# Patient Record
Sex: Female | Born: 1975 | Race: Black or African American | Hispanic: No | Marital: Single | Smoking: Never smoker
Health system: Southern US, Community
[De-identification: ages and names within clinical notes are randomized; demographics above are authoritative.]

## PROBLEM LIST (undated history)

## (undated) ENCOUNTER — Ambulatory Visit (HOSPITAL_COMMUNITY): Admission: EM | Discharge status: Absent without leave | Payer: Medicaid Other

## (undated) DIAGNOSIS — M329 Systemic lupus erythematosus, unspecified: Secondary | ICD-10-CM

## (undated) DIAGNOSIS — R7611 Nonspecific reaction to tuberculin skin test without active tuberculosis: Secondary | ICD-10-CM

## (undated) DIAGNOSIS — J189 Pneumonia, unspecified organism: Secondary | ICD-10-CM

## (undated) DIAGNOSIS — R519 Headache, unspecified: Secondary | ICD-10-CM

## (undated) DIAGNOSIS — K219 Gastro-esophageal reflux disease without esophagitis: Secondary | ICD-10-CM

## (undated) DIAGNOSIS — M1990 Unspecified osteoarthritis, unspecified site: Secondary | ICD-10-CM

## (undated) DIAGNOSIS — D649 Anemia, unspecified: Secondary | ICD-10-CM

## (undated) DIAGNOSIS — R51 Headache: Secondary | ICD-10-CM

## (undated) DIAGNOSIS — Z9289 Personal history of other medical treatment: Secondary | ICD-10-CM

## (undated) HISTORY — PX: RENAL BIOPSY, PERCUTANEOUS: SUR144

---

## 2001-06-04 ENCOUNTER — Emergency Department (HOSPITAL_COMMUNITY): Admission: EM | Admit: 2001-06-04 | Discharge: 2001-06-05 | Payer: Self-pay | Admitting: *Deleted

## 2002-01-05 ENCOUNTER — Encounter: Admission: RE | Admit: 2002-01-05 | Discharge: 2002-01-05 | Payer: Self-pay

## 2002-03-01 ENCOUNTER — Other Ambulatory Visit: Admission: RE | Admit: 2002-03-01 | Discharge: 2002-03-01 | Payer: Self-pay | Admitting: Obstetrics and Gynecology

## 2002-09-13 ENCOUNTER — Other Ambulatory Visit: Admission: RE | Admit: 2002-09-13 | Discharge: 2002-09-13 | Payer: Self-pay | Admitting: Obstetrics and Gynecology

## 2003-06-04 ENCOUNTER — Other Ambulatory Visit: Admission: RE | Admit: 2003-06-04 | Discharge: 2003-06-04 | Payer: Self-pay | Admitting: Obstetrics and Gynecology

## 2003-10-17 ENCOUNTER — Other Ambulatory Visit: Admission: RE | Admit: 2003-10-17 | Discharge: 2003-10-17 | Payer: Self-pay | Admitting: Obstetrics and Gynecology

## 2003-10-21 ENCOUNTER — Emergency Department (HOSPITAL_COMMUNITY): Admission: EM | Admit: 2003-10-21 | Discharge: 2003-10-21 | Payer: Self-pay | Admitting: Emergency Medicine

## 2004-08-22 ENCOUNTER — Ambulatory Visit: Payer: Self-pay | Admitting: Internal Medicine

## 2004-08-28 ENCOUNTER — Other Ambulatory Visit: Admission: RE | Admit: 2004-08-28 | Discharge: 2004-08-28 | Payer: Self-pay | Admitting: Obstetrics and Gynecology

## 2005-01-30 ENCOUNTER — Ambulatory Visit: Payer: Self-pay | Admitting: Family Medicine

## 2005-03-03 ENCOUNTER — Other Ambulatory Visit: Admission: RE | Admit: 2005-03-03 | Discharge: 2005-03-03 | Payer: Self-pay | Admitting: Obstetrics and Gynecology

## 2005-03-26 ENCOUNTER — Encounter: Payer: Self-pay | Admitting: Emergency Medicine

## 2005-03-27 ENCOUNTER — Inpatient Hospital Stay (HOSPITAL_COMMUNITY): Admission: EM | Admit: 2005-03-27 | Discharge: 2005-03-31 | Payer: Self-pay | Admitting: Internal Medicine

## 2005-03-30 ENCOUNTER — Encounter (INDEPENDENT_AMBULATORY_CARE_PROVIDER_SITE_OTHER): Payer: Self-pay | Admitting: Interventional Cardiology

## 2005-05-16 ENCOUNTER — Emergency Department (HOSPITAL_COMMUNITY): Admission: EM | Admit: 2005-05-16 | Discharge: 2005-05-16 | Payer: Self-pay | Admitting: Emergency Medicine

## 2005-06-06 ENCOUNTER — Emergency Department (HOSPITAL_COMMUNITY): Admission: EM | Admit: 2005-06-06 | Discharge: 2005-06-06 | Payer: Self-pay | Admitting: Family Medicine

## 2005-08-13 ENCOUNTER — Ambulatory Visit: Payer: Self-pay | Admitting: Internal Medicine

## 2005-08-25 ENCOUNTER — Ambulatory Visit: Payer: Self-pay | Admitting: Internal Medicine

## 2005-09-15 ENCOUNTER — Ambulatory Visit: Payer: Self-pay | Admitting: Internal Medicine

## 2005-09-16 ENCOUNTER — Ambulatory Visit (HOSPITAL_COMMUNITY): Admission: RE | Admit: 2005-09-16 | Discharge: 2005-09-16 | Payer: Self-pay | Admitting: Internal Medicine

## 2005-09-28 ENCOUNTER — Ambulatory Visit (HOSPITAL_COMMUNITY): Admission: RE | Admit: 2005-09-28 | Discharge: 2005-09-28 | Payer: Self-pay | Admitting: Internal Medicine

## 2005-11-02 ENCOUNTER — Ambulatory Visit (HOSPITAL_COMMUNITY): Admission: RE | Admit: 2005-11-02 | Discharge: 2005-11-02 | Payer: Self-pay | Admitting: Internal Medicine

## 2005-11-16 ENCOUNTER — Ambulatory Visit: Payer: Self-pay | Admitting: Internal Medicine

## 2005-11-24 ENCOUNTER — Encounter (INDEPENDENT_AMBULATORY_CARE_PROVIDER_SITE_OTHER): Payer: Self-pay | Admitting: Cardiology

## 2005-11-24 ENCOUNTER — Ambulatory Visit (HOSPITAL_COMMUNITY): Admission: RE | Admit: 2005-11-24 | Discharge: 2005-11-24 | Payer: Self-pay | Admitting: Internal Medicine

## 2006-01-04 ENCOUNTER — Ambulatory Visit: Payer: Self-pay | Admitting: Internal Medicine

## 2006-02-22 ENCOUNTER — Ambulatory Visit: Payer: Self-pay | Admitting: Internal Medicine

## 2006-03-02 ENCOUNTER — Ambulatory Visit: Payer: Self-pay | Admitting: Internal Medicine

## 2006-03-29 ENCOUNTER — Ambulatory Visit: Payer: Self-pay | Admitting: Internal Medicine

## 2006-05-26 ENCOUNTER — Ambulatory Visit: Payer: Self-pay | Admitting: Internal Medicine

## 2006-07-12 ENCOUNTER — Encounter (INDEPENDENT_AMBULATORY_CARE_PROVIDER_SITE_OTHER): Payer: Self-pay | Admitting: Internal Medicine

## 2006-08-11 ENCOUNTER — Ambulatory Visit: Payer: Self-pay | Admitting: Internal Medicine

## 2006-09-08 ENCOUNTER — Encounter (INDEPENDENT_AMBULATORY_CARE_PROVIDER_SITE_OTHER): Payer: Self-pay | Admitting: Internal Medicine

## 2006-10-20 ENCOUNTER — Encounter (INDEPENDENT_AMBULATORY_CARE_PROVIDER_SITE_OTHER): Payer: Self-pay | Admitting: Internal Medicine

## 2006-11-18 DIAGNOSIS — R945 Abnormal results of liver function studies: Secondary | ICD-10-CM

## 2006-11-18 DIAGNOSIS — B379 Candidiasis, unspecified: Secondary | ICD-10-CM | POA: Insufficient documentation

## 2006-11-18 DIAGNOSIS — J029 Acute pharyngitis, unspecified: Secondary | ICD-10-CM

## 2006-11-25 ENCOUNTER — Ambulatory Visit: Payer: Self-pay | Admitting: Internal Medicine

## 2006-11-25 DIAGNOSIS — M329 Systemic lupus erythematosus, unspecified: Secondary | ICD-10-CM | POA: Insufficient documentation

## 2006-12-05 LAB — CONVERTED CEMR LAB
Basophils Absolute: 0 10*3/uL
Basophils Relative: 0 %
Eosinophils Absolute: 0.1 10*3/uL
Eosinophils Relative: 1 %
HCT: 38.4 %
Hemoglobin: 11.6 g/dL — ABNORMAL LOW
Lymphocytes Relative: 17 %
Lymphs Abs: 1.1 10*3/uL
MCHC: 30.2 g/dL
MCV: 91 fL
Monocytes Absolute: 0.3 10*3/uL
Monocytes Relative: 5 %
Neutro Abs: 4.9 10*3/uL
Neutrophils Relative %: 76 %
Platelets: 204 10*3/uL
RBC: 4.22 M/uL
RDW: 13.8 %
WBC: 6.4 10*3/uL

## 2007-02-03 HISTORY — PX: DILATION AND CURETTAGE OF UTERUS: SHX78

## 2007-02-09 ENCOUNTER — Encounter (INDEPENDENT_AMBULATORY_CARE_PROVIDER_SITE_OTHER): Payer: Self-pay | Admitting: Internal Medicine

## 2007-02-09 ENCOUNTER — Telehealth (INDEPENDENT_AMBULATORY_CARE_PROVIDER_SITE_OTHER): Payer: Self-pay | Admitting: Internal Medicine

## 2007-02-10 ENCOUNTER — Ambulatory Visit: Payer: Self-pay | Admitting: Internal Medicine

## 2007-02-14 ENCOUNTER — Ambulatory Visit: Payer: Self-pay | Admitting: Internal Medicine

## 2007-02-14 LAB — CONVERTED CEMR LAB
ALT: 31 units/L (ref 0–35)
AST: 40 units/L — ABNORMAL HIGH (ref 0–37)
Albumin: 3.9 g/dL (ref 3.5–5.2)
Total Protein: 8.5 g/dL — ABNORMAL HIGH (ref 6.0–8.3)

## 2007-02-15 ENCOUNTER — Telehealth (INDEPENDENT_AMBULATORY_CARE_PROVIDER_SITE_OTHER): Payer: Self-pay | Admitting: Internal Medicine

## 2007-02-18 LAB — CONVERTED CEMR LAB
Albumin ELP: 43.7 % — ABNORMAL LOW (ref 55.8–66.1)
Beta Globulin: 5.6 % (ref 4.7–7.2)
Gamma Globulin: 31.7 % — ABNORMAL HIGH (ref 11.1–18.8)
IgG (Immunoglobin G), Serum: 3300 mg/dL — ABNORMAL HIGH (ref 694–1618)

## 2007-02-19 ENCOUNTER — Inpatient Hospital Stay (HOSPITAL_COMMUNITY): Admission: AD | Admit: 2007-02-19 | Discharge: 2007-02-19 | Payer: Self-pay | Admitting: Obstetrics and Gynecology

## 2007-02-21 ENCOUNTER — Telehealth (INDEPENDENT_AMBULATORY_CARE_PROVIDER_SITE_OTHER): Payer: Self-pay | Admitting: Internal Medicine

## 2007-02-23 ENCOUNTER — Inpatient Hospital Stay (HOSPITAL_COMMUNITY): Admission: AD | Admit: 2007-02-23 | Discharge: 2007-02-23 | Payer: Self-pay | Admitting: Obstetrics and Gynecology

## 2007-02-23 ENCOUNTER — Ambulatory Visit: Payer: Self-pay | Admitting: Internal Medicine

## 2007-02-24 ENCOUNTER — Encounter (INDEPENDENT_AMBULATORY_CARE_PROVIDER_SITE_OTHER): Payer: Self-pay | Admitting: Internal Medicine

## 2007-02-27 ENCOUNTER — Inpatient Hospital Stay (HOSPITAL_COMMUNITY): Admission: AD | Admit: 2007-02-27 | Discharge: 2007-02-27 | Payer: Self-pay | Admitting: Obstetrics and Gynecology

## 2007-03-02 ENCOUNTER — Inpatient Hospital Stay (HOSPITAL_COMMUNITY): Admission: AD | Admit: 2007-03-02 | Discharge: 2007-03-03 | Payer: Self-pay | Admitting: Obstetrics and Gynecology

## 2007-03-07 ENCOUNTER — Encounter (INDEPENDENT_AMBULATORY_CARE_PROVIDER_SITE_OTHER): Payer: Self-pay | Admitting: Obstetrics and Gynecology

## 2007-03-07 ENCOUNTER — Encounter (INDEPENDENT_AMBULATORY_CARE_PROVIDER_SITE_OTHER): Payer: Self-pay | Admitting: Internal Medicine

## 2007-03-07 ENCOUNTER — Ambulatory Visit (HOSPITAL_COMMUNITY): Admission: AD | Admit: 2007-03-07 | Discharge: 2007-03-07 | Payer: Self-pay | Admitting: Obstetrics and Gynecology

## 2007-03-08 ENCOUNTER — Telehealth (INDEPENDENT_AMBULATORY_CARE_PROVIDER_SITE_OTHER): Payer: Self-pay | Admitting: Internal Medicine

## 2007-03-08 ENCOUNTER — Emergency Department (HOSPITAL_COMMUNITY): Admission: EM | Admit: 2007-03-08 | Discharge: 2007-03-09 | Payer: Self-pay | Admitting: Emergency Medicine

## 2007-09-24 ENCOUNTER — Encounter (INDEPENDENT_AMBULATORY_CARE_PROVIDER_SITE_OTHER): Payer: Self-pay | Admitting: Internal Medicine

## 2007-09-24 DIAGNOSIS — K219 Gastro-esophageal reflux disease without esophagitis: Secondary | ICD-10-CM | POA: Insufficient documentation

## 2007-11-03 ENCOUNTER — Telehealth (INDEPENDENT_AMBULATORY_CARE_PROVIDER_SITE_OTHER): Payer: Self-pay | Admitting: Internal Medicine

## 2007-11-03 ENCOUNTER — Emergency Department (HOSPITAL_COMMUNITY): Admission: EM | Admit: 2007-11-03 | Discharge: 2007-11-04 | Payer: Self-pay | Admitting: Family Medicine

## 2007-11-08 ENCOUNTER — Telehealth (INDEPENDENT_AMBULATORY_CARE_PROVIDER_SITE_OTHER): Payer: Self-pay | Admitting: Internal Medicine

## 2007-11-08 DIAGNOSIS — N83209 Unspecified ovarian cyst, unspecified side: Secondary | ICD-10-CM

## 2007-12-30 ENCOUNTER — Emergency Department (HOSPITAL_COMMUNITY): Admission: EM | Admit: 2007-12-30 | Discharge: 2007-12-31 | Payer: Self-pay | Admitting: Emergency Medicine

## 2008-01-02 ENCOUNTER — Emergency Department (HOSPITAL_COMMUNITY): Admission: EM | Admit: 2008-01-02 | Discharge: 2008-01-02 | Payer: Self-pay | Admitting: Family Medicine

## 2008-02-01 ENCOUNTER — Encounter (INDEPENDENT_AMBULATORY_CARE_PROVIDER_SITE_OTHER): Payer: Self-pay | Admitting: Internal Medicine

## 2008-02-21 ENCOUNTER — Encounter (INDEPENDENT_AMBULATORY_CARE_PROVIDER_SITE_OTHER): Payer: Self-pay | Admitting: Internal Medicine

## 2008-03-01 ENCOUNTER — Ambulatory Visit: Payer: Self-pay | Admitting: Internal Medicine

## 2008-03-01 DIAGNOSIS — R350 Frequency of micturition: Secondary | ICD-10-CM

## 2008-03-01 LAB — CONVERTED CEMR LAB
ALT: 18 units/L (ref 0–35)
Basophils Absolute: 0 10*3/uL (ref 0.0–0.1)
Bilirubin Urine: NEGATIVE
CO2: 22 meq/L (ref 19–32)
Calcium: 8.7 mg/dL (ref 8.4–10.5)
Chloride: 104 meq/L (ref 96–112)
Hemoglobin: 10.7 g/dL — ABNORMAL LOW (ref 12.0–15.0)
Lymphocytes Relative: 25 % (ref 12–46)
Monocytes Absolute: 0.5 10*3/uL (ref 0.1–1.0)
Neutro Abs: 4.7 10*3/uL (ref 1.7–7.7)
Platelets: 218 10*3/uL (ref 150–400)
RDW: 14.4 % (ref 11.5–15.5)
Sodium: 139 meq/L (ref 135–145)
Specific Gravity, Urine: >=1.03
Total Protein: 8.3 g/dL (ref 6.0–8.3)
Urobilinogen, UA: 0.2
WBC: 7.1 10*3/uL (ref 4.0–10.5)
pH: 6.5

## 2008-03-02 ENCOUNTER — Encounter (INDEPENDENT_AMBULATORY_CARE_PROVIDER_SITE_OTHER): Payer: Self-pay | Admitting: Internal Medicine

## 2008-03-08 ENCOUNTER — Telehealth (INDEPENDENT_AMBULATORY_CARE_PROVIDER_SITE_OTHER): Payer: Self-pay | Admitting: Internal Medicine

## 2008-03-30 ENCOUNTER — Ambulatory Visit: Payer: Self-pay | Admitting: Internal Medicine

## 2008-04-10 ENCOUNTER — Emergency Department (HOSPITAL_COMMUNITY): Admission: EM | Admit: 2008-04-10 | Discharge: 2008-04-10 | Payer: Self-pay | Admitting: Emergency Medicine

## 2008-04-10 ENCOUNTER — Encounter (INDEPENDENT_AMBULATORY_CARE_PROVIDER_SITE_OTHER): Payer: Self-pay | Admitting: Internal Medicine

## 2008-04-22 LAB — CONVERTED CEMR LAB
Alkaline Phosphatase: 64 units/L (ref 39–117)
Basophils Absolute: 0 10*3/uL (ref 0.0–0.1)
Eosinophils Absolute: 0.1 10*3/uL (ref 0.0–0.7)
Eosinophils Relative: 2 % (ref 0–5)
Glucose, Bld: 112 mg/dL — ABNORMAL HIGH (ref 70–99)
HCT: 35 % — ABNORMAL LOW (ref 36.0–46.0)
MCHC: 32.3 g/dL (ref 30.0–36.0)
MCV: 87.9 fL (ref 78.0–100.0)
Platelets: 225 10*3/uL (ref 150–400)
RDW: 14.6 % (ref 11.5–15.5)
Sodium: 140 meq/L (ref 135–145)
Total Bilirubin: 0.3 mg/dL (ref 0.3–1.2)
Total Protein: 8.4 g/dL — ABNORMAL HIGH (ref 6.0–8.3)

## 2008-04-24 ENCOUNTER — Telehealth (INDEPENDENT_AMBULATORY_CARE_PROVIDER_SITE_OTHER): Payer: Self-pay | Admitting: Internal Medicine

## 2008-04-27 ENCOUNTER — Ambulatory Visit: Payer: Self-pay | Admitting: Internal Medicine

## 2008-04-27 ENCOUNTER — Encounter (INDEPENDENT_AMBULATORY_CARE_PROVIDER_SITE_OTHER): Payer: Self-pay | Admitting: Nurse Practitioner

## 2008-04-27 DIAGNOSIS — L0390 Cellulitis, unspecified: Secondary | ICD-10-CM

## 2008-04-27 DIAGNOSIS — L0291 Cutaneous abscess, unspecified: Secondary | ICD-10-CM | POA: Insufficient documentation

## 2008-04-30 LAB — CONVERTED CEMR LAB
BUN: 13 mg/dL (ref 6–23)
Basophils Relative: 1 % (ref 0–1)
CO2: 26 meq/L (ref 19–32)
Creatinine, Ser: 0.75 mg/dL (ref 0.40–1.20)
Eosinophils Absolute: 0.1 10*3/uL (ref 0.0–0.7)
Eosinophils Relative: 2 % (ref 0–5)
Glucose, Bld: 85 mg/dL (ref 70–99)
MCHC: 32.7 g/dL (ref 30.0–36.0)
MCV: 87.1 fL (ref 78.0–100.0)
Monocytes Absolute: 0.4 10*3/uL (ref 0.1–1.0)
Monocytes Relative: 6 % (ref 3–12)
Neutrophils Relative %: 69 % (ref 43–77)
RBC: 3.72 M/uL — ABNORMAL LOW (ref 3.87–5.11)
Total Bilirubin: 0.2 mg/dL — ABNORMAL LOW (ref 0.3–1.2)

## 2008-07-09 ENCOUNTER — Encounter (INDEPENDENT_AMBULATORY_CARE_PROVIDER_SITE_OTHER): Payer: Self-pay | Admitting: Internal Medicine

## 2008-07-31 ENCOUNTER — Ambulatory Visit: Payer: Self-pay | Admitting: Internal Medicine

## 2008-07-31 DIAGNOSIS — R51 Headache: Secondary | ICD-10-CM | POA: Insufficient documentation

## 2008-07-31 DIAGNOSIS — G4700 Insomnia, unspecified: Secondary | ICD-10-CM | POA: Insufficient documentation

## 2008-07-31 DIAGNOSIS — R519 Headache, unspecified: Secondary | ICD-10-CM | POA: Insufficient documentation

## 2008-07-31 LAB — CONVERTED CEMR LAB
Bilirubin Urine: NEGATIVE
Glucose, Urine, Semiquant: NEGATIVE
Ketones, urine, test strip: NEGATIVE
Protein, U semiquant: >=300
Whiff Test: POSITIVE

## 2008-08-01 ENCOUNTER — Encounter (INDEPENDENT_AMBULATORY_CARE_PROVIDER_SITE_OTHER): Payer: Self-pay | Admitting: Internal Medicine

## 2008-08-10 ENCOUNTER — Encounter (INDEPENDENT_AMBULATORY_CARE_PROVIDER_SITE_OTHER): Payer: Self-pay | Admitting: Internal Medicine

## 2008-08-16 ENCOUNTER — Encounter (INDEPENDENT_AMBULATORY_CARE_PROVIDER_SITE_OTHER): Payer: Self-pay | Admitting: Internal Medicine

## 2008-08-16 ENCOUNTER — Encounter: Admission: RE | Admit: 2008-08-16 | Discharge: 2008-09-14 | Payer: Self-pay | Admitting: Internal Medicine

## 2008-08-19 LAB — CONVERTED CEMR LAB
AST: 47 units/L — ABNORMAL HIGH (ref 0–37)
Albumin: 3.4 g/dL — ABNORMAL LOW (ref 3.5–5.2)
Alkaline Phosphatase: 51 units/L (ref 39–117)
BUN: 14 mg/dL (ref 6–23)
Calcium: 8.7 mg/dL (ref 8.4–10.5)
Chloride: 104 meq/L (ref 96–112)
Eosinophils Relative: 1 % (ref 0–5)
Glucose, Bld: 82 mg/dL (ref 70–99)
HCT: 35.5 % — ABNORMAL LOW (ref 36.0–46.0)
Hemoglobin: 11.3 g/dL — ABNORMAL LOW (ref 12.0–15.0)
Lymphocytes Relative: 21 % (ref 12–46)
Lymphs Abs: 0.9 10*3/uL (ref 0.7–4.0)
Monocytes Absolute: 0.3 10*3/uL (ref 0.1–1.0)
Neutro Abs: 3.2 10*3/uL (ref 1.7–7.7)
Potassium: 4.5 meq/L (ref 3.5–5.3)
Sodium: 136 meq/L (ref 135–145)
Total Protein: 8.3 g/dL (ref 6.0–8.3)
WBC: 4.5 10*3/uL (ref 4.0–10.5)

## 2008-08-21 ENCOUNTER — Ambulatory Visit: Payer: Self-pay | Admitting: Internal Medicine

## 2008-08-21 LAB — CONVERTED CEMR LAB
Ketones, urine, test strip: NEGATIVE
Nitrite: NEGATIVE
Specific Gravity, Urine: >=1.03
Urobilinogen, UA: 0.2
WBC Urine, dipstick: NEGATIVE
pH: 5.5

## 2008-09-23 LAB — CONVERTED CEMR LAB
HCV Ab: NEGATIVE
Hep A Total Ab: NEGATIVE
Hep B Core Total Ab: NEGATIVE
Hep B S Ab: NEGATIVE
RBC Folate: 516 ng/mL (ref 180–600)
TIBC: 248 ug/dL — ABNORMAL LOW (ref 250–470)

## 2008-10-12 ENCOUNTER — Encounter (INDEPENDENT_AMBULATORY_CARE_PROVIDER_SITE_OTHER): Payer: Self-pay | Admitting: Internal Medicine

## 2008-11-01 ENCOUNTER — Ambulatory Visit: Payer: Self-pay | Admitting: Internal Medicine

## 2008-11-01 DIAGNOSIS — R3129 Other microscopic hematuria: Secondary | ICD-10-CM

## 2008-11-01 DIAGNOSIS — D509 Iron deficiency anemia, unspecified: Secondary | ICD-10-CM

## 2008-11-01 LAB — CONVERTED CEMR LAB
Ketones, urine, test strip: NEGATIVE
Nitrite: NEGATIVE
Specific Gravity, Urine: 1.025
Urobilinogen, UA: 0.2
pH: 5.5

## 2008-11-02 ENCOUNTER — Encounter (INDEPENDENT_AMBULATORY_CARE_PROVIDER_SITE_OTHER): Payer: Self-pay | Admitting: Internal Medicine

## 2008-11-09 DIAGNOSIS — N390 Urinary tract infection, site not specified: Secondary | ICD-10-CM

## 2008-11-10 IMAGING — US US OB TRANSVAGINAL
1 series · 14 of 25 positions shown · non-contrast
Comparison: 02/19/07.

CLINICAL DATA: 5 weeks pregnant with bleeding.  
 TRANSVAGINAL OBSTETRICAL US:
TECHNIQUE: Transvaginal ultrasound was performed for evaluation of the gestation as well as the maternal uterus and adnexal regions.

[Series 1: us ob transvaginal · 0.14mm/px · 14 of 25 slices shown]
[im 1/25]
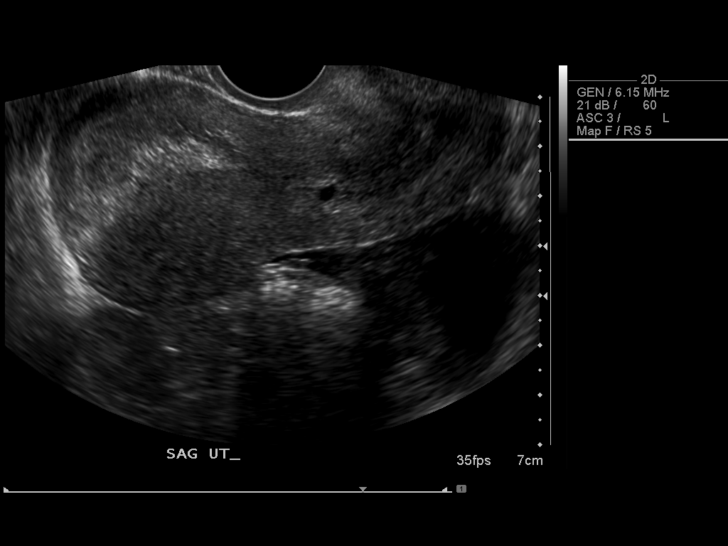
[im 3/25]
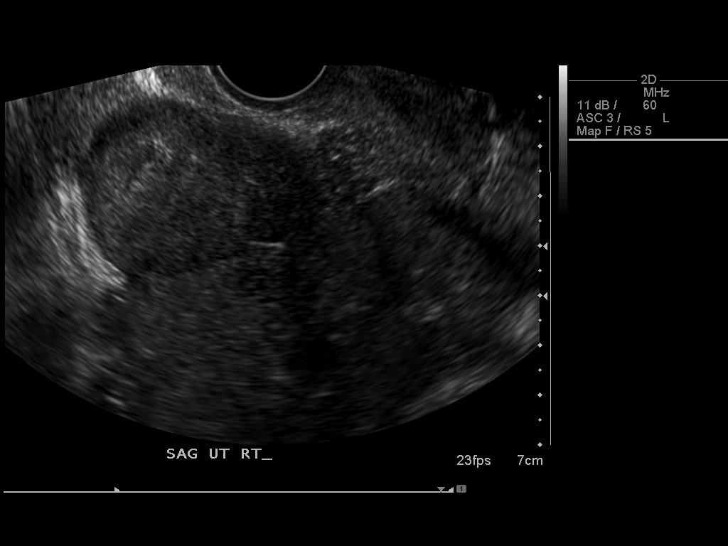
[im 5/25]
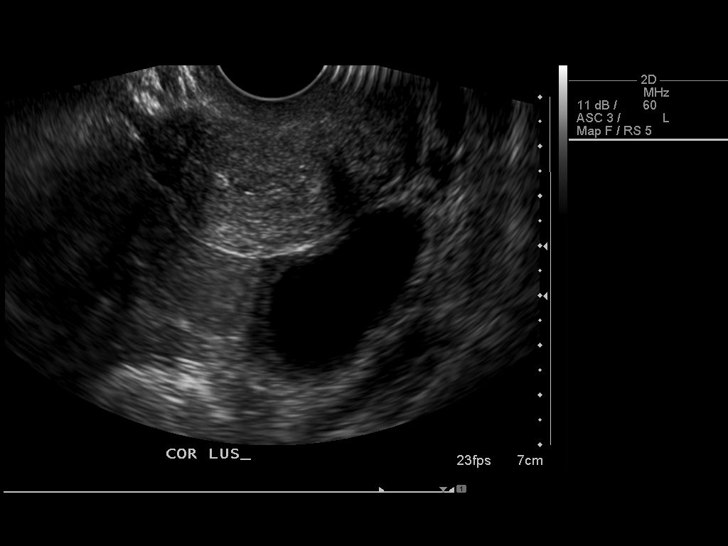
[im 7/25]
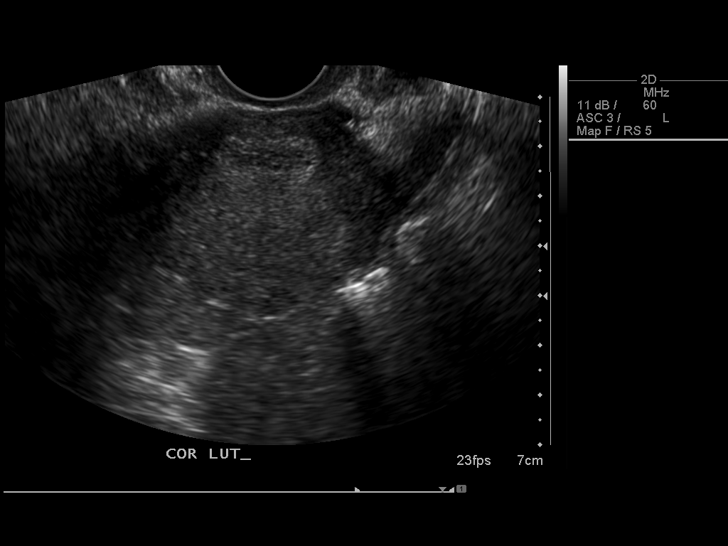
[im 9/25]
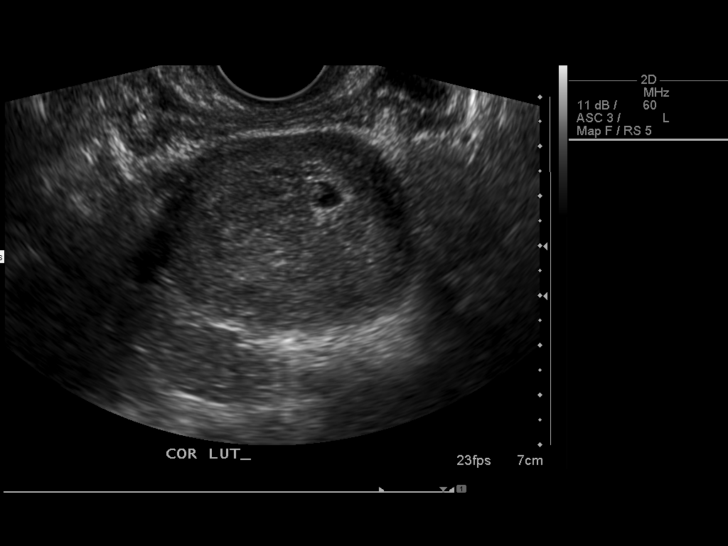
[im 10/25]
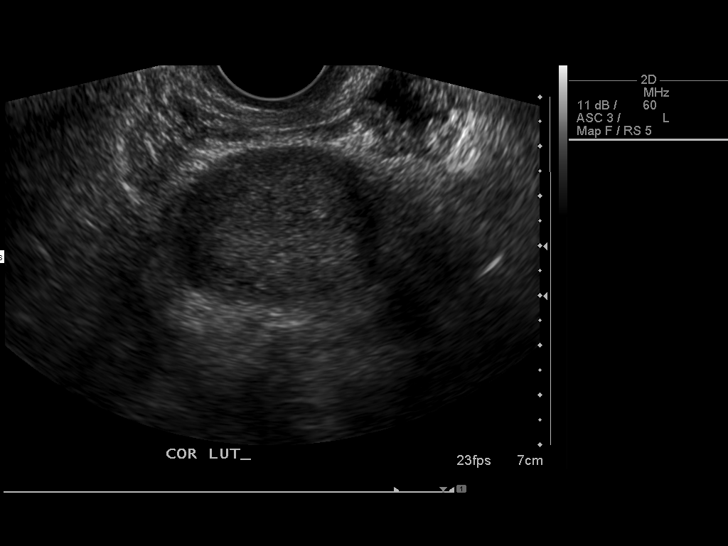
[im 12/25]
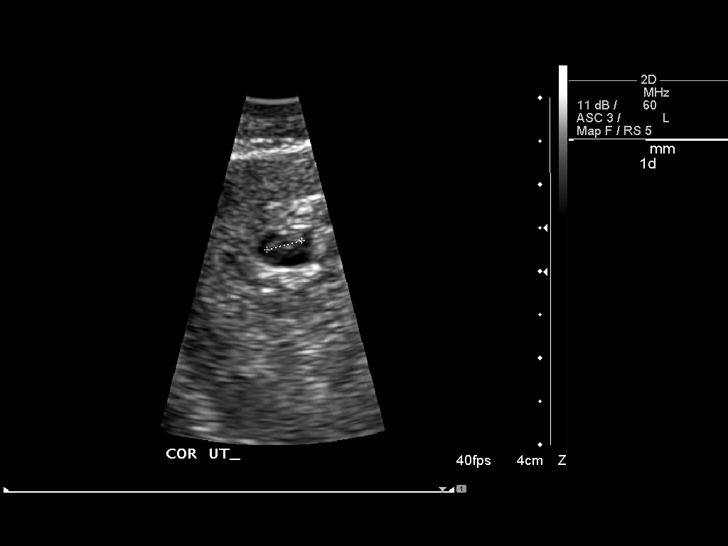
[im 14/25]
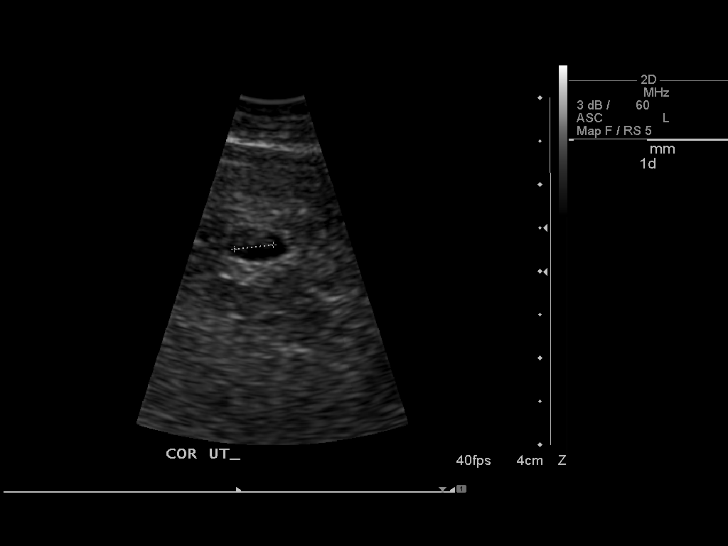
[im 16/25]
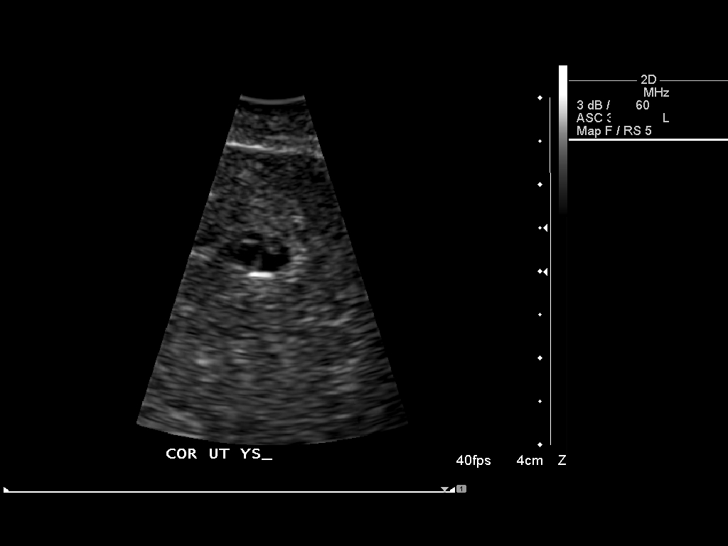
[im 17/25]
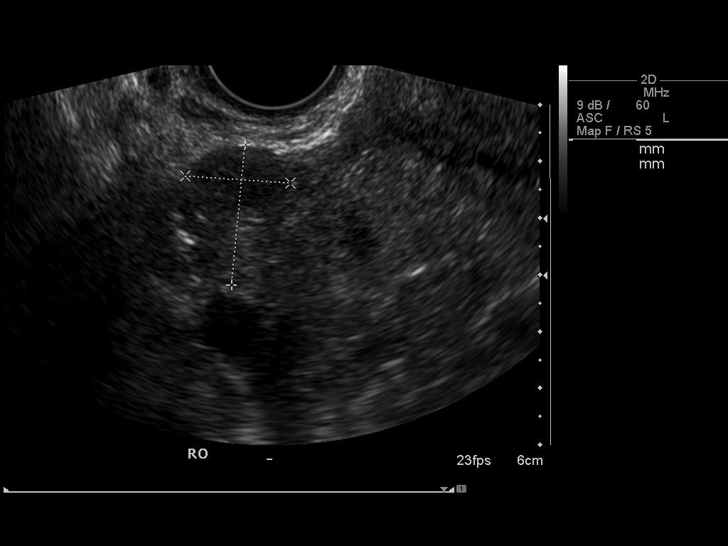
[im 19/25]
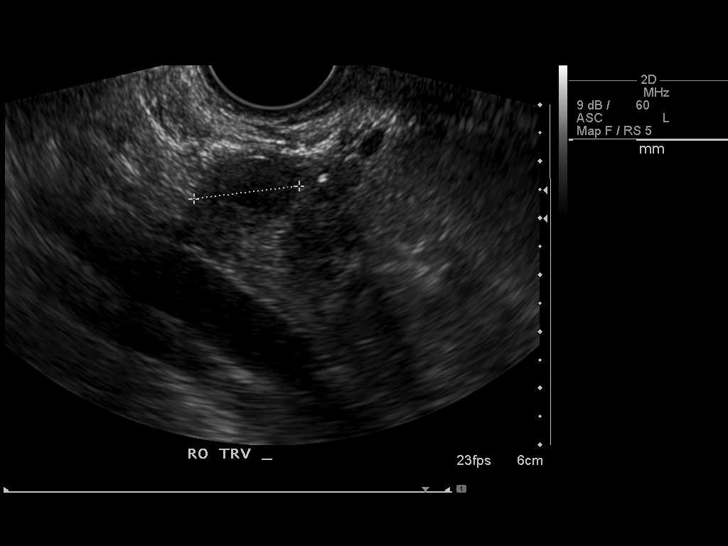
[im 21/25]
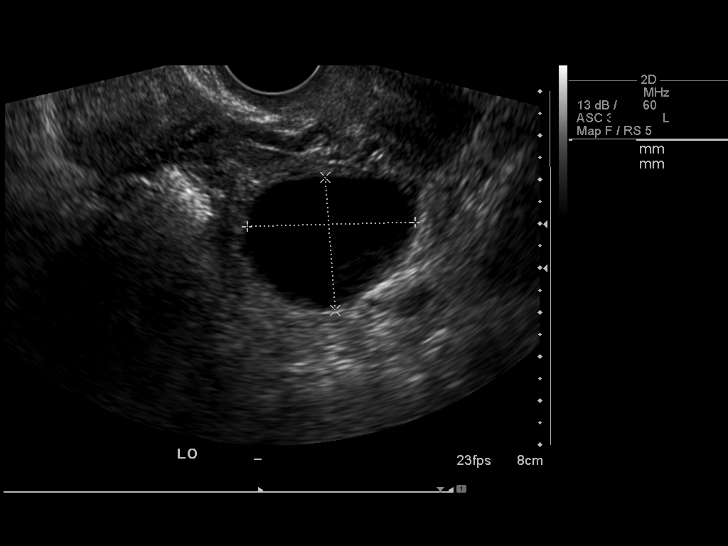
[im 23/25]
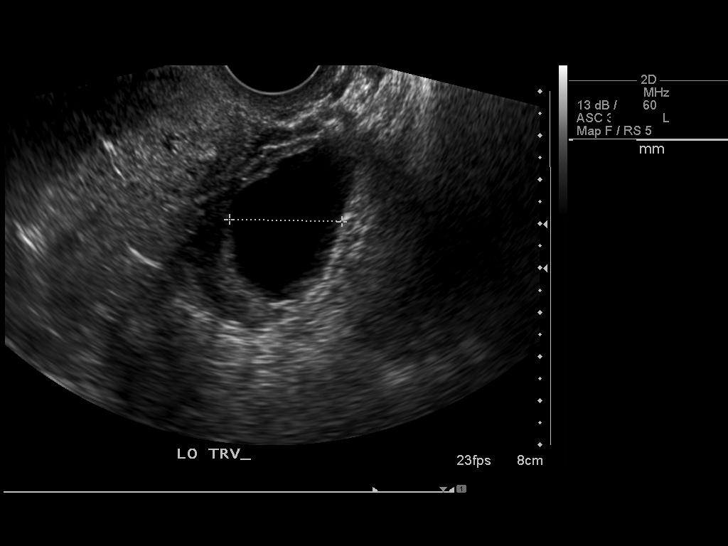
[im 25/25]
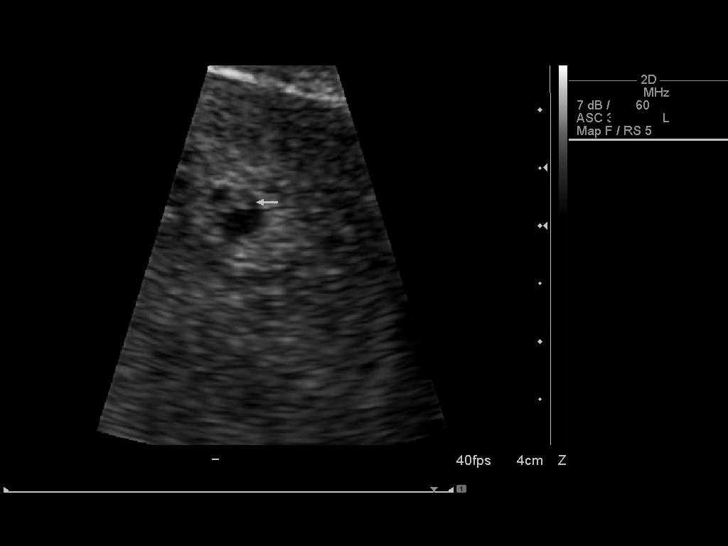

[14 of 25 positions shown; findings below may reference images not displayed]

FINDINGS: Small intrauterine gestational sac is identified with a small embryo measuring 4.2 mm, which corresponds with a 6 week 1 day gestation.  Heart rate is 123 bpm.  Small yolk sac is seen.  
 Small subchorionic hemorrhage is noted.  The right ovary is normal.  The left ovary has a 3.8 x 3.0 x 2.5 cm cyst associated with it.  No free pelvic fluid collections are seen.
IMPRESSION: 1.  Single living intrauterine embryo estimated at 6 weeks 1 day gestation based on crown-rump length of 4.2 mm.  
 2.  Small yolk sac is seen.  
 3.  3.8 x 3.0 x 2.5 cm cyst associated with the left ovary.  
 4.  No free pelvic fluid collections are seen.  
 5.  Small subchorionic hemorrhage.

## 2008-11-13 ENCOUNTER — Ambulatory Visit: Payer: Self-pay | Admitting: Physician Assistant

## 2008-11-13 DIAGNOSIS — M25569 Pain in unspecified knee: Secondary | ICD-10-CM

## 2008-11-13 DIAGNOSIS — R21 Rash and other nonspecific skin eruption: Secondary | ICD-10-CM | POA: Insufficient documentation

## 2008-11-14 IMAGING — US US OB TRANSVAGINAL
1 series · 13 of 28 positions shown · non-contrast
Comparison: none

CLINICAL DATA: 31-year-old with LMP [DATE], early OB ultrasound 4 days ago
with estimated gestational age 6 weeks 5 days, presenting with continued vaginal
bleeding.

[Series 1: us ob transvaginal · 0.13mm/px · 13 of 37 slices shown]
[im 2/37]
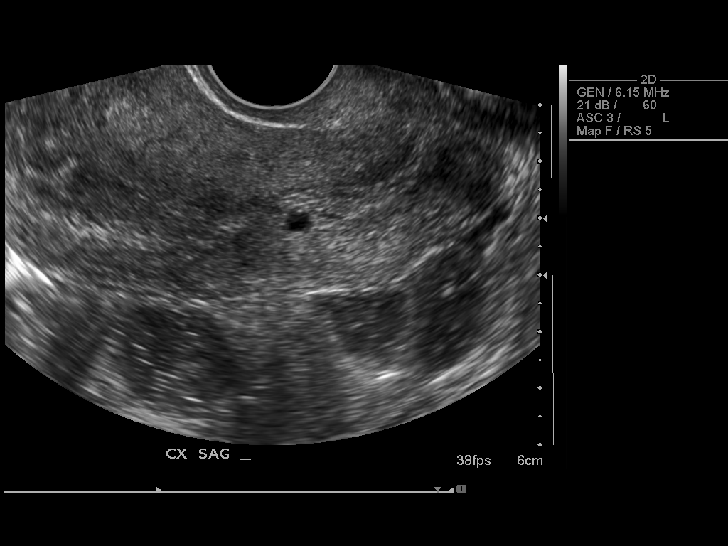
[im 5/37]
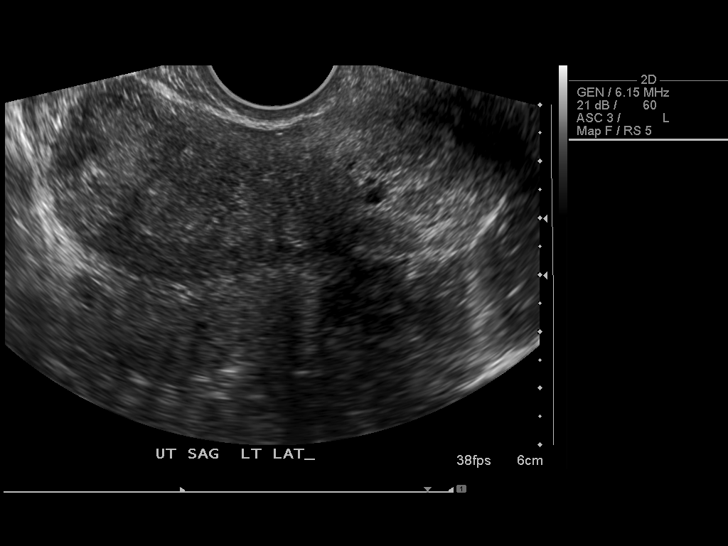
[im 7/37]
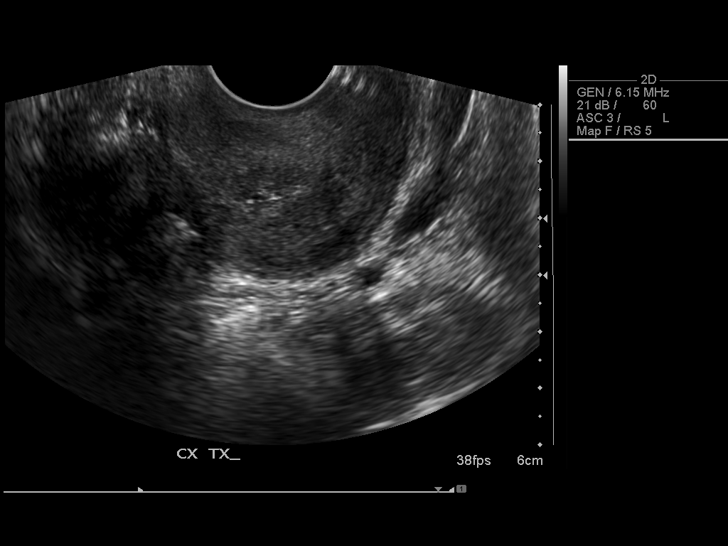
[im 10/37]
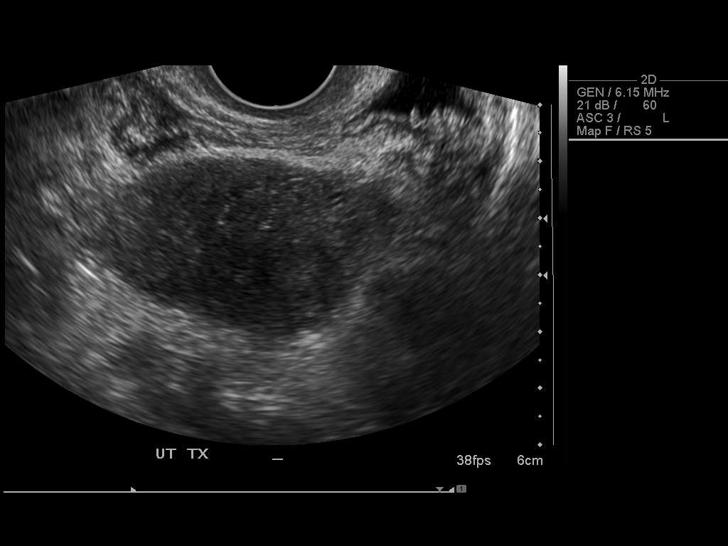
[im 13/37]
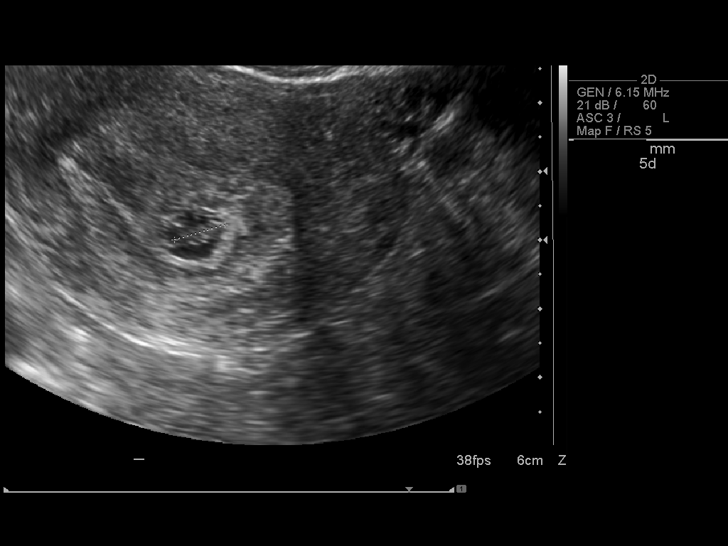
[im 15/37]
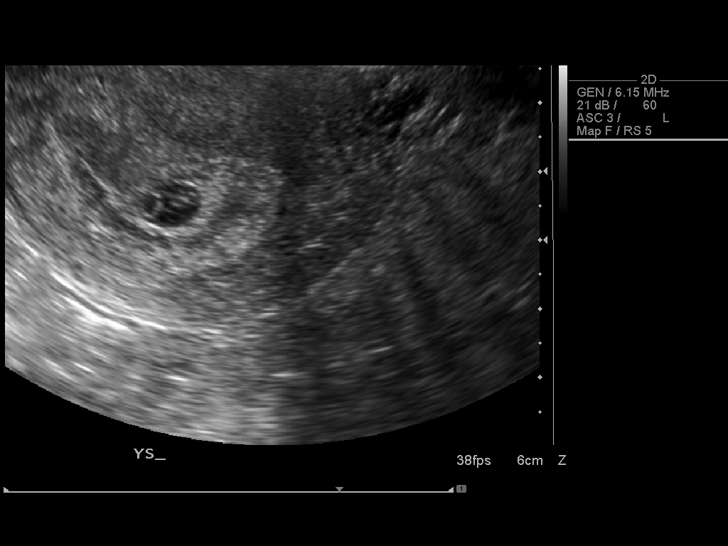
[im 19/37]
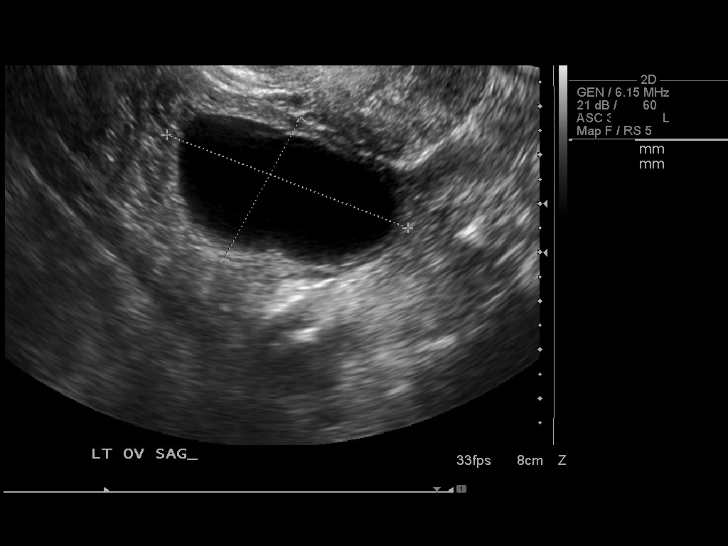
[im 22/37]
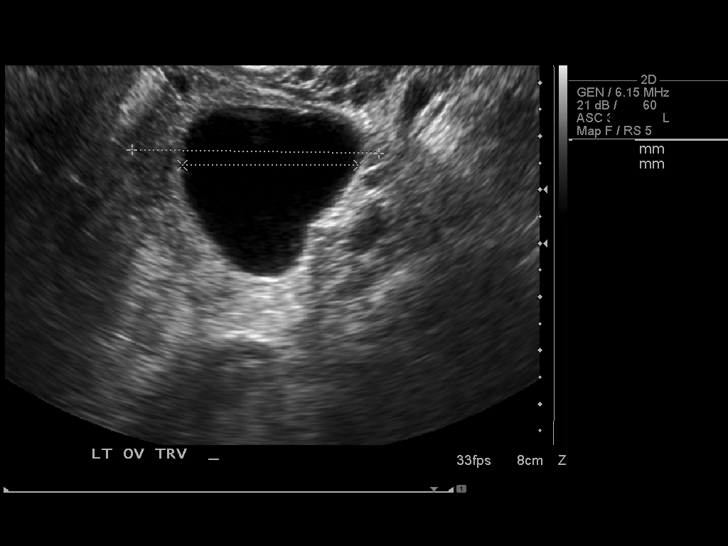
[im 25/37]
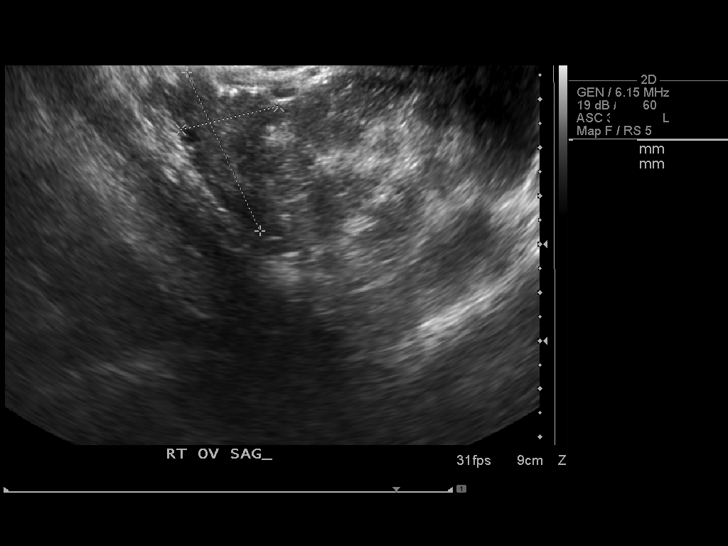
[im 27/37]
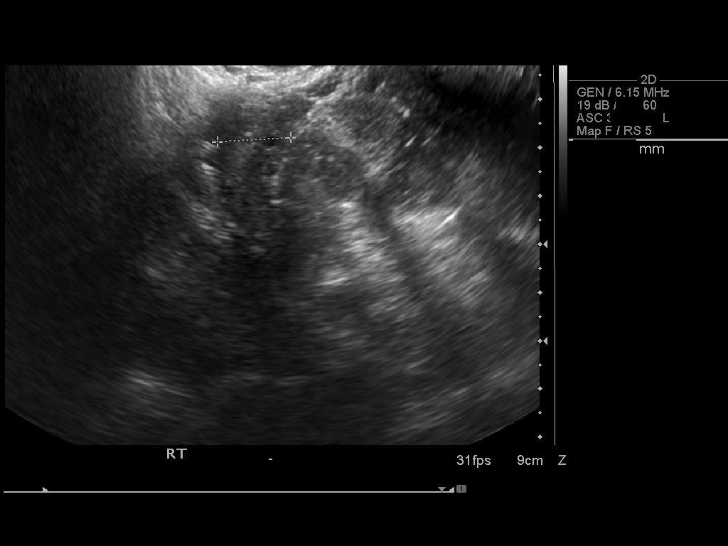
[im 30/37]
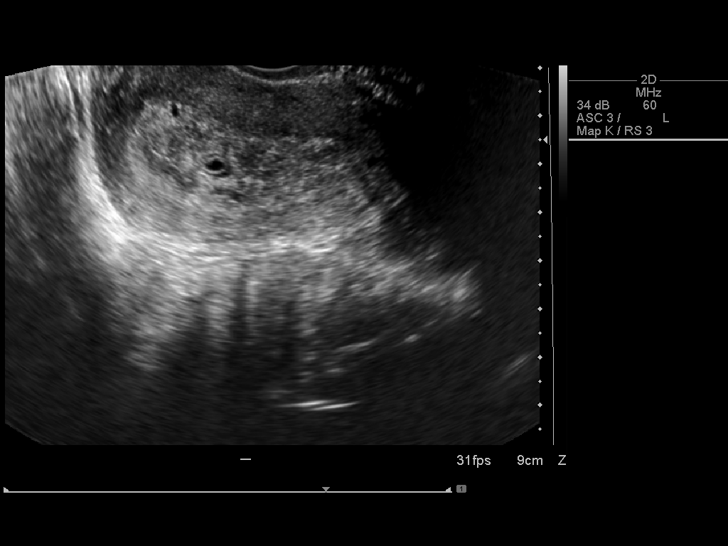
[im 33/37]
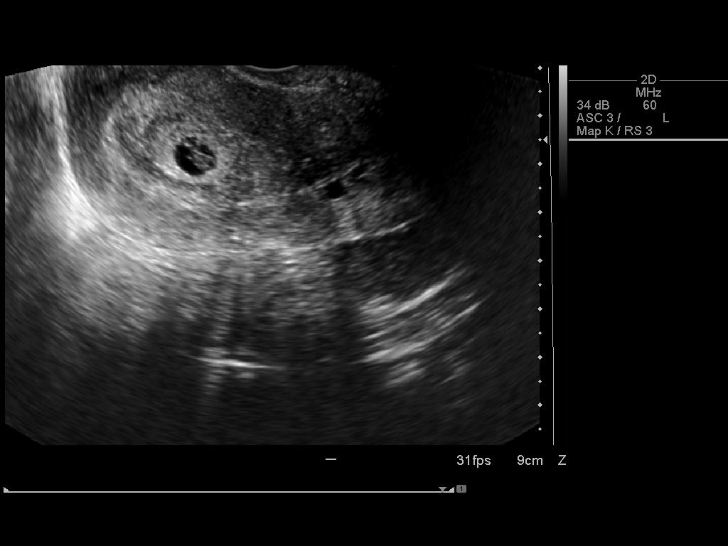
[im 35/37]
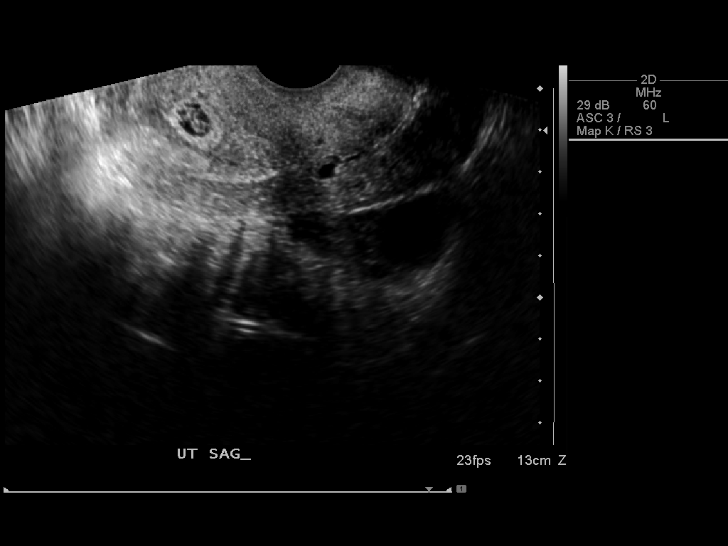

[13 of 28 positions shown; findings below may reference images not displayed]

OBSTETRICAL ULTRASOUND LESS THAN 14 WEEKS (TRANSVAGINAL ONLY) 02/27/2007: 

Intrauterine gestational sac: Single
Yolk sac: Visible
Embryo: Visible
Cardiac activity: 119 beats per minute

CRL:  6.9 mm  -  6 w 4 d

US EDC:  10/09/2007

MATERNAL UTERINE AND ADNEXAL FINDINGS:
Uterus:  Small subchorionic hemorrhage again noted and unchanged. Normal
decidual reaction. No focal myometrial abnormalities.

Adnexae: Right ovary normal in size with normal color Doppler signal measuring
approximately 3.6 x 2.1 x 1.5 cm. Left ovary mildly enlarged with normal color
Doppler signal measuring approximately 5.3 x 3.3 x 4.6 cm. Large simple cyst in
the left ovary measuring approximately 4.7 x 2.6 x 3.3 cm, slightly increased in
size. No free fluid.
IMPRESSION: 1. Single living intrauterine embryo with estimated gestational age 6 weeks 4
days by crown-rump length (estimated gestational a 6 weeks 5 days by ultrasound
performed 4 days ago).
2. Enlarging left ovarian cyst, now with maximum measurement of 4.7 cm
(previously 3.8 cm 4 days ago).
3. Stable small subchorionic hemorrhage.

## 2008-11-15 ENCOUNTER — Encounter (INDEPENDENT_AMBULATORY_CARE_PROVIDER_SITE_OTHER): Payer: Self-pay | Admitting: Internal Medicine

## 2008-11-15 LAB — CONVERTED CEMR LAB
ALT: 13 units/L (ref 0–35)
AST: 21 units/L (ref 0–37)
Albumin: 3.1 g/dL — ABNORMAL LOW (ref 3.5–5.2)
Alkaline Phosphatase: 53 units/L (ref 39–117)
BUN: 14 mg/dL (ref 6–23)
Basophils Absolute: 0 10*3/uL (ref 0.0–0.1)
Creatinine, Ser: 0.79 mg/dL (ref 0.40–1.20)
Eosinophils Relative: 2 % (ref 0–5)
HCT: 30.5 % — ABNORMAL LOW (ref 36.0–46.0)
Lymphocytes Relative: 30 % (ref 12–46)
Lymphs Abs: 1.9 10*3/uL (ref 0.7–4.0)
Neutrophils Relative %: 59 % (ref 43–77)
Platelets: 175 10*3/uL (ref 150–400)
Potassium: 3.5 meq/L (ref 3.5–5.3)
RDW: 14.5 % (ref 11.5–15.5)
WBC: 6.2 10*3/uL (ref 4.0–10.5)

## 2008-11-19 ENCOUNTER — Telehealth (INDEPENDENT_AMBULATORY_CARE_PROVIDER_SITE_OTHER): Payer: Self-pay | Admitting: Internal Medicine

## 2008-11-21 ENCOUNTER — Encounter (INDEPENDENT_AMBULATORY_CARE_PROVIDER_SITE_OTHER): Payer: Self-pay | Admitting: Internal Medicine

## 2008-11-22 IMAGING — US US OB TRANSVAGINAL
1 series · 14 of 26 positions shown · non-contrast
Comparison: 03/03/07.

CLINICAL DATA: Patient status post miscarriage with continued vaginal bleeding.  Question retained products.  
 TRANSVAGINAL OBSTETRICAL US:
TECHNIQUE: Transvaginal ultrasound was performed for evaluation of the gestation as well as the maternal uterus and adnexal regions.

[Series 1: us ob transvaginal · 14 of 26 slices shown]
[im 1/26]
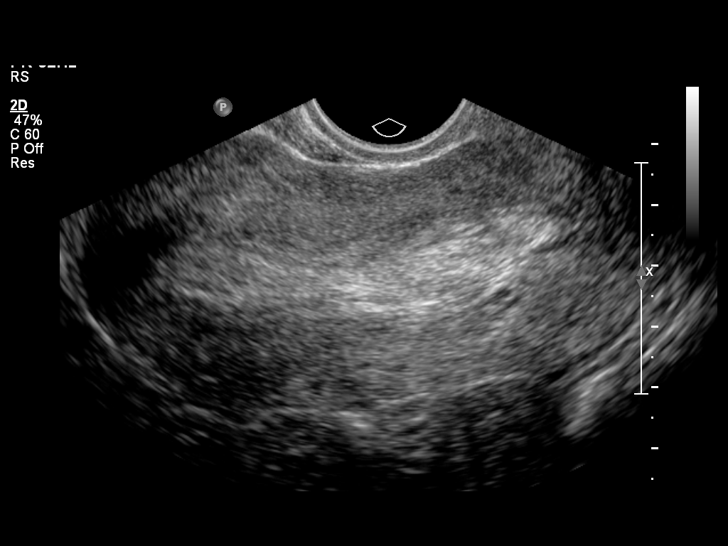
[im 3/26]
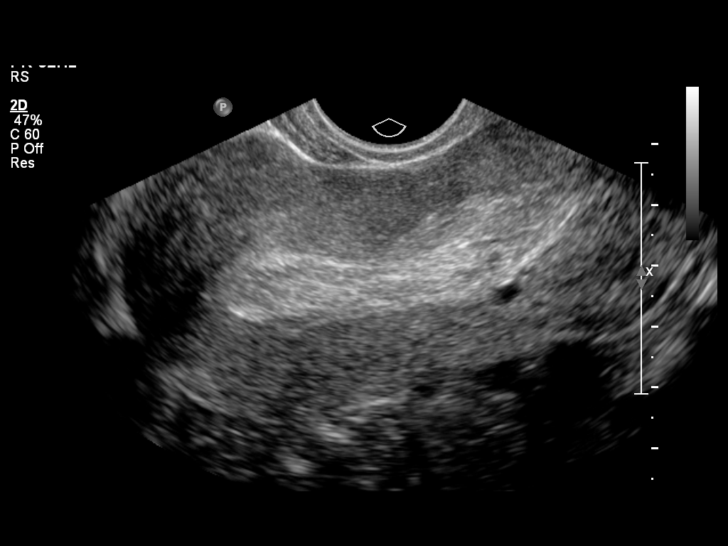
[im 5/26]
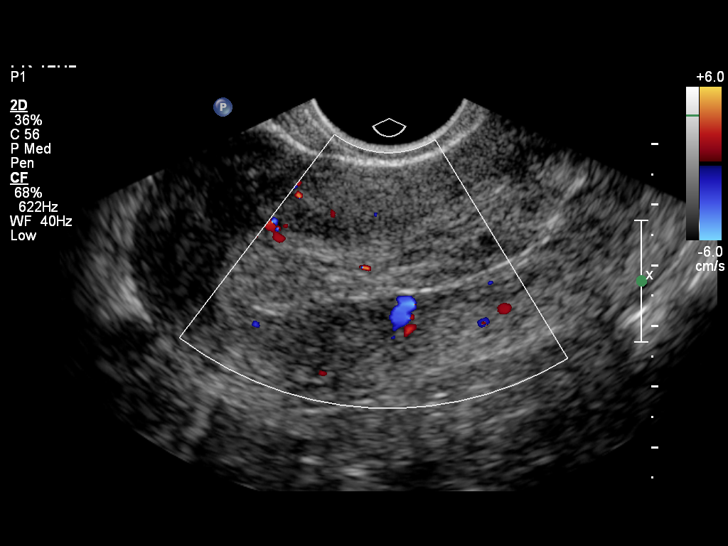
[im 7/26]
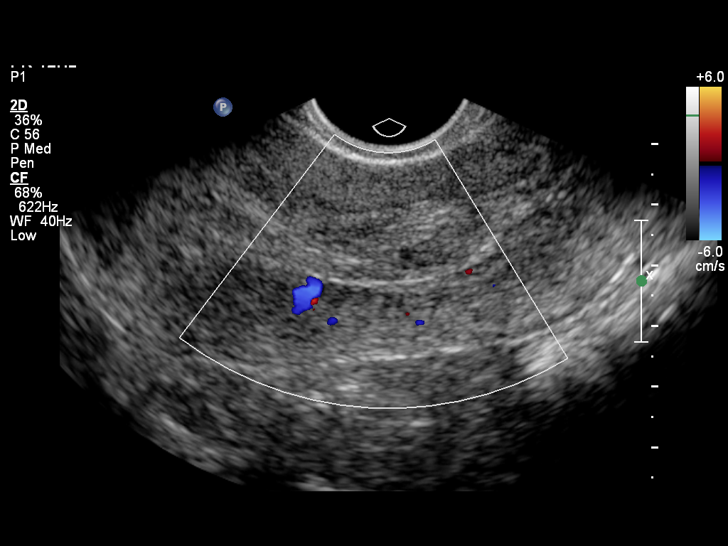
[im 9/26]
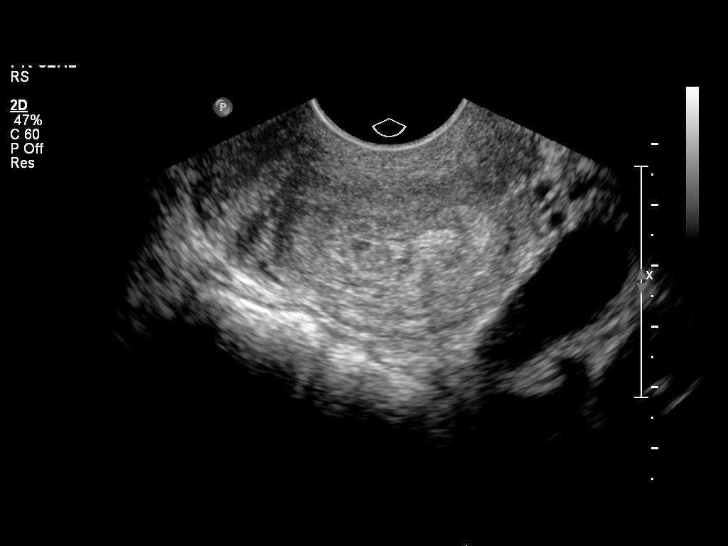
[im 11/26]
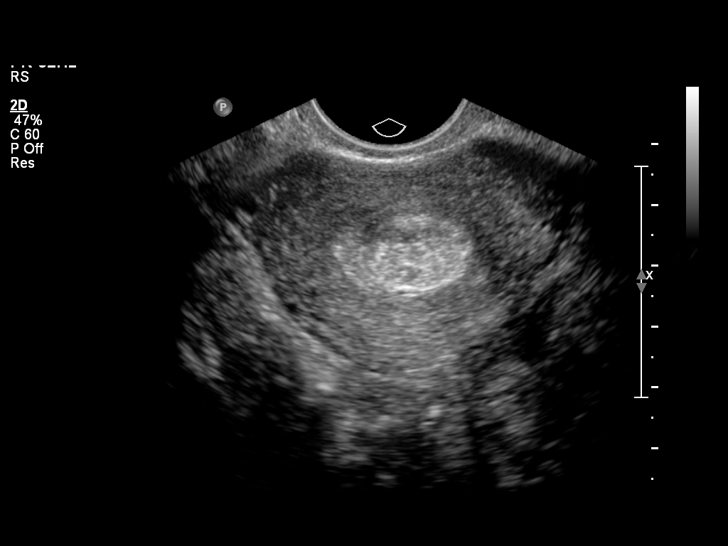
[im 13/26]
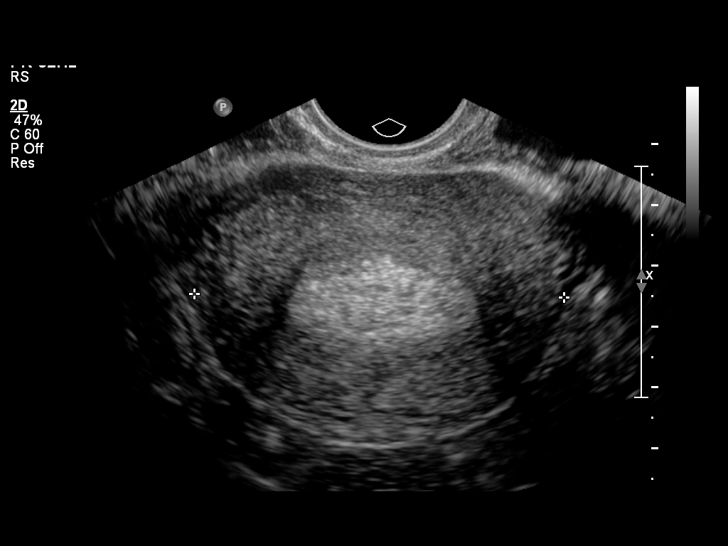
[im 14/26]
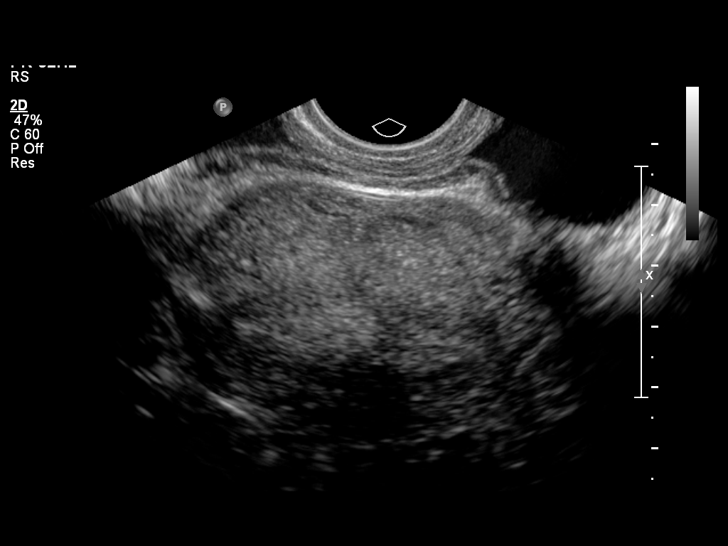
[im 16/26]
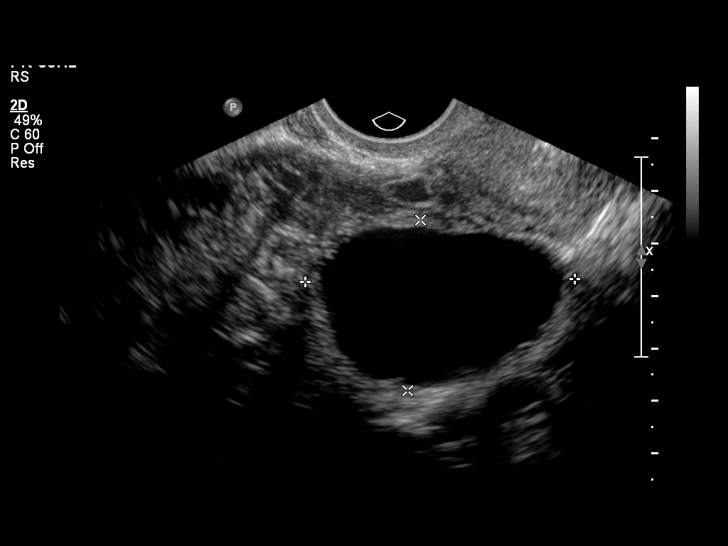
[im 18/26]
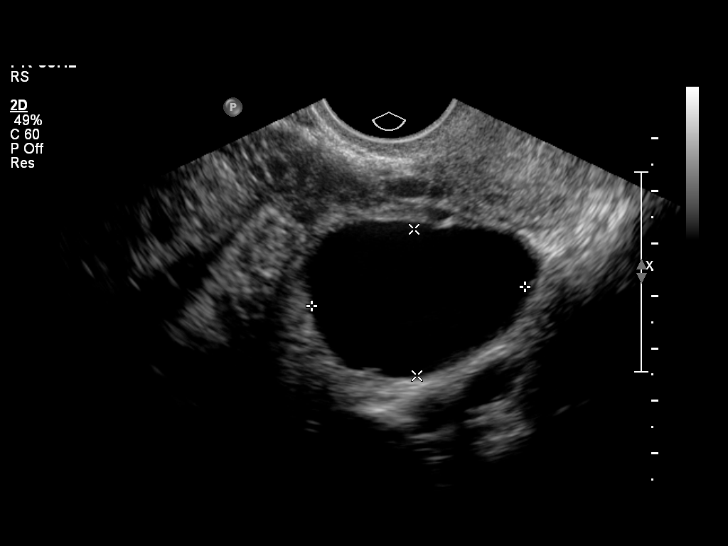
[im 20/26]
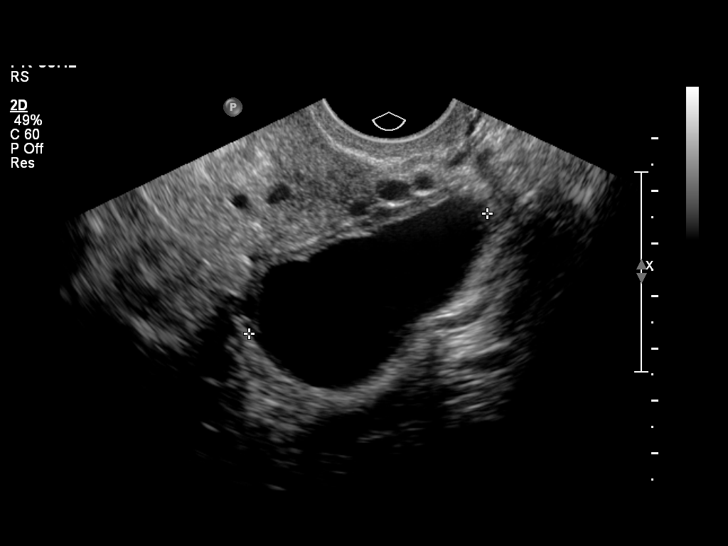
[im 22/26]
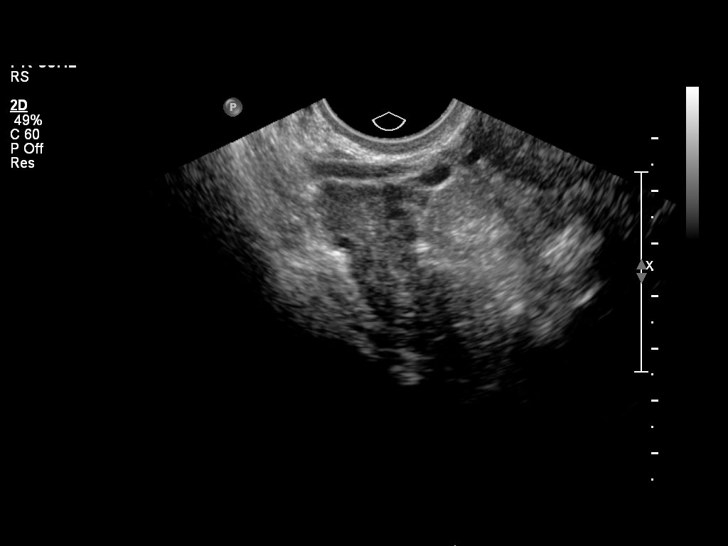
[im 24/26]
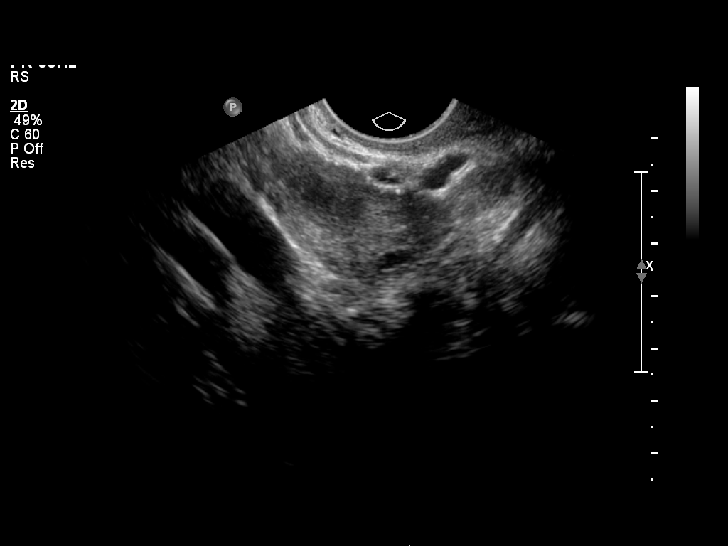
[im 26/26]
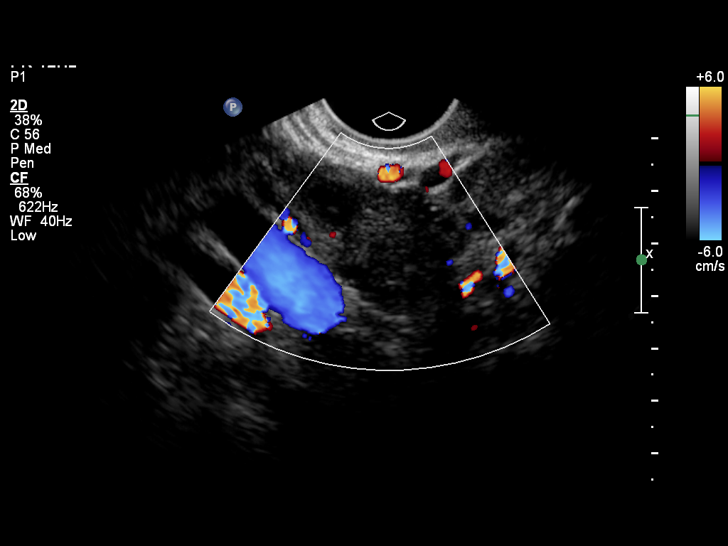

[14 of 26 positions shown; findings below may reference images not displayed]

FINDINGS: The uterus measures 10.0 x 4.7 x 6.1 cm.  Endometrial stripe thickness is approximately 1.5 cm with some evidence of blood flow within the endometrial cavity.  
 The right ovary measures 3.8 x 2.1 x 1.5 cm and is unremarkable.  The left ovary measures 5.1 x 3.3 x 5.1 cm.  Simple appearing cyst in the left ovary measures 4.1 x 2.8 x 4.8 cm.  No free pelvic fluid.
IMPRESSION: Persistent thickening of the endometrial stripe with blood flow within the canal worrisome for retained products of conception.

## 2008-12-06 ENCOUNTER — Telehealth (INDEPENDENT_AMBULATORY_CARE_PROVIDER_SITE_OTHER): Payer: Self-pay | Admitting: *Deleted

## 2008-12-13 ENCOUNTER — Ambulatory Visit: Payer: Self-pay | Admitting: Internal Medicine

## 2008-12-13 LAB — CONVERTED CEMR LAB
Blood in Urine, dipstick: NEGATIVE
Nitrite: NEGATIVE
Urobilinogen, UA: 0.2
WBC Urine, dipstick: NEGATIVE

## 2008-12-17 ENCOUNTER — Ambulatory Visit: Payer: Self-pay | Admitting: Internal Medicine

## 2008-12-17 LAB — CONVERTED CEMR LAB
BUN: 21 mg/dL (ref 6–23)
Chloride: 107 meq/L (ref 96–112)
Creatinine, Ser: 0.94 mg/dL (ref 0.40–1.20)
Glucose, Bld: 93 mg/dL (ref 70–99)
Potassium: 3.6 meq/L (ref 3.5–5.3)

## 2008-12-31 ENCOUNTER — Encounter (INDEPENDENT_AMBULATORY_CARE_PROVIDER_SITE_OTHER): Payer: Self-pay | Admitting: Internal Medicine

## 2009-01-02 ENCOUNTER — Telehealth (INDEPENDENT_AMBULATORY_CARE_PROVIDER_SITE_OTHER): Payer: Self-pay | Admitting: Internal Medicine

## 2009-01-09 ENCOUNTER — Encounter: Admission: RE | Admit: 2009-01-09 | Discharge: 2009-01-09 | Payer: Self-pay | Admitting: Nephrology

## 2009-01-15 ENCOUNTER — Ambulatory Visit (HOSPITAL_COMMUNITY): Admission: RE | Admit: 2009-01-15 | Discharge: 2009-01-15 | Payer: Self-pay | Admitting: Nephrology

## 2009-01-17 ENCOUNTER — Encounter (INDEPENDENT_AMBULATORY_CARE_PROVIDER_SITE_OTHER): Payer: Self-pay | Admitting: Internal Medicine

## 2009-01-22 ENCOUNTER — Encounter: Admission: RE | Admit: 2009-01-22 | Discharge: 2009-01-22 | Payer: Self-pay | Admitting: Nephrology

## 2009-02-11 ENCOUNTER — Encounter (INDEPENDENT_AMBULATORY_CARE_PROVIDER_SITE_OTHER): Payer: Self-pay | Admitting: Internal Medicine

## 2009-03-11 ENCOUNTER — Telehealth (INDEPENDENT_AMBULATORY_CARE_PROVIDER_SITE_OTHER): Payer: Self-pay | Admitting: Internal Medicine

## 2009-03-19 ENCOUNTER — Encounter (INDEPENDENT_AMBULATORY_CARE_PROVIDER_SITE_OTHER): Payer: Self-pay | Admitting: *Deleted

## 2009-04-05 ENCOUNTER — Encounter (INDEPENDENT_AMBULATORY_CARE_PROVIDER_SITE_OTHER): Payer: Self-pay | Admitting: Internal Medicine

## 2009-05-08 ENCOUNTER — Encounter (INDEPENDENT_AMBULATORY_CARE_PROVIDER_SITE_OTHER): Payer: Self-pay | Admitting: Internal Medicine

## 2009-05-08 ENCOUNTER — Ambulatory Visit: Payer: Self-pay | Admitting: Physician Assistant

## 2009-05-09 ENCOUNTER — Encounter (INDEPENDENT_AMBULATORY_CARE_PROVIDER_SITE_OTHER): Payer: Self-pay | Admitting: Internal Medicine

## 2009-05-10 ENCOUNTER — Encounter (INDEPENDENT_AMBULATORY_CARE_PROVIDER_SITE_OTHER): Payer: Self-pay | Admitting: Internal Medicine

## 2009-05-10 ENCOUNTER — Telehealth (INDEPENDENT_AMBULATORY_CARE_PROVIDER_SITE_OTHER): Payer: Self-pay | Admitting: Internal Medicine

## 2009-06-25 ENCOUNTER — Encounter (INDEPENDENT_AMBULATORY_CARE_PROVIDER_SITE_OTHER): Payer: Self-pay | Admitting: Internal Medicine

## 2009-07-10 ENCOUNTER — Encounter (HOSPITAL_COMMUNITY): Admission: RE | Admit: 2009-07-10 | Discharge: 2009-09-11 | Payer: Self-pay | Admitting: Nephrology

## 2009-10-02 ENCOUNTER — Encounter (INDEPENDENT_AMBULATORY_CARE_PROVIDER_SITE_OTHER): Payer: Self-pay | Admitting: Internal Medicine

## 2009-12-10 ENCOUNTER — Ambulatory Visit (HOSPITAL_COMMUNITY): Admission: RE | Admit: 2009-12-10 | Discharge: 2009-12-10 | Payer: Self-pay | Admitting: Internal Medicine

## 2009-12-10 ENCOUNTER — Ambulatory Visit: Payer: Self-pay | Admitting: Internal Medicine

## 2009-12-10 DIAGNOSIS — M79609 Pain in unspecified limb: Secondary | ICD-10-CM

## 2009-12-11 ENCOUNTER — Telehealth (INDEPENDENT_AMBULATORY_CARE_PROVIDER_SITE_OTHER): Payer: Self-pay | Admitting: Internal Medicine

## 2009-12-18 ENCOUNTER — Encounter (INDEPENDENT_AMBULATORY_CARE_PROVIDER_SITE_OTHER): Payer: Self-pay | Admitting: Internal Medicine

## 2009-12-25 ENCOUNTER — Encounter (INDEPENDENT_AMBULATORY_CARE_PROVIDER_SITE_OTHER): Payer: Self-pay | Admitting: Internal Medicine

## 2009-12-31 ENCOUNTER — Telehealth (INDEPENDENT_AMBULATORY_CARE_PROVIDER_SITE_OTHER): Payer: Self-pay | Admitting: Internal Medicine

## 2010-01-07 ENCOUNTER — Encounter (INDEPENDENT_AMBULATORY_CARE_PROVIDER_SITE_OTHER): Payer: Self-pay | Admitting: Internal Medicine

## 2010-01-23 ENCOUNTER — Ambulatory Visit: Payer: Self-pay | Admitting: Internal Medicine

## 2010-01-24 ENCOUNTER — Encounter (INDEPENDENT_AMBULATORY_CARE_PROVIDER_SITE_OTHER): Payer: Self-pay | Admitting: Internal Medicine

## 2010-01-24 LAB — CONVERTED CEMR LAB
Bilirubin Urine: NEGATIVE
Glucose, Urine, Semiquant: NEGATIVE
Ketones, urine, test strip: NEGATIVE
Specific Gravity, Urine: >=1.03

## 2010-01-30 LAB — CONVERTED CEMR LAB
ALT: 16 units/L (ref 0–35)
AST: 21 units/L (ref 0–37)
Albumin: 3.7 g/dL (ref 3.5–5.2)
BUN: 13 mg/dL (ref 6–23)
Basophils Relative: 1 % (ref 0–1)
Calcium: 8.9 mg/dL (ref 8.4–10.5)
Chloride: 105 meq/L (ref 96–112)
Lymphs Abs: 2.3 10*3/uL (ref 0.7–4.0)
MCHC: 31.8 g/dL (ref 30.0–36.0)
Monocytes Relative: 10 % (ref 3–12)
Neutro Abs: 3.1 10*3/uL (ref 1.7–7.7)
Neutrophils Relative %: 51 % (ref 43–77)
Potassium: 3.8 meq/L (ref 3.5–5.3)
RBC: 4.04 M/uL (ref 3.87–5.11)
Total Protein: 7.1 g/dL (ref 6.0–8.3)
WBC: 6.1 10*3/uL (ref 4.0–10.5)

## 2010-03-04 NOTE — Letter (Signed)
Summary: [facility]  [facility]   Imported By: Arta Bruce 06/25/2009 15:11:50  _____________________________________________________________________  External Attachment:    Type:   Image     Comment:   External Document

## 2010-03-04 NOTE — Letter (Signed)
Summary: FAXED REQUESTED RECORDS TO [facility]  FAXED REQUESTED RECORDS TO [facility]   Imported By: Arta Bruce 02/11/2009 12:54:38  _____________________________________________________________________  External Attachment:    Type:   Image     Comment:   External Document

## 2010-03-04 NOTE — Progress Notes (Signed)
  Phone Note Outgoing Call   Summary of Call: left msg for pt to call for lab appt for C3 and C4 complements and anti ds DNA antibody. Initial call taken by: Vesta Mixer CMA,  March 11, 2009 3:25 PM  Follow-up for Phone Call        Left message on answering machine for pt to return call  Follow-up by: Vesta Mixer CMA,  March 13, 2009 12:07 PM  Additional Follow-up for Phone Call Additional follow up Details #1::        Left message on answering machine for pt to return call will also mail letter since 3rd attempt to reach pt by phone. Additional Follow-up by: Vesta Mixer CMA,  March 19, 2009 10:05 AM    Additional Follow-up for Phone Call Additional follow up Details #2::    SPOKE WITH PATIENT AND SHE IS SCHEULED TO HAVE LABS THIS FRIDAY THE 18th. Follow-up by: Leodis Rains,  March 19, 2009 11:58 AM

## 2010-03-04 NOTE — Letter (Signed)
Summary: [facility]  [facility]   Imported By: Arta Bruce 12/25/2009 14:46:44  _____________________________________________________________________  External Attachment:    Type:   Image     Comment:   External Document

## 2010-03-04 NOTE — Progress Notes (Signed)
  Phone Note Call from Patient Call back at [facility] Phone [phone] Call back at [phone]   Summary of Call: The pt states that Dr Carolan Clines didn't see the ulcer today and if the provider has any additional questions please call her back. Randel Hargens MD Initial call taken by: Manon Hilding,  May 10, 2009 4:59 PM  Follow-up for Phone Call        Fwd to Dr. Delrae Alfred for review. Follow-up by: Vesta Mixer CMA,  May 10, 2009 5:39 PM     Appended Document:  Faxed labs to Dr. Mechele Dawley

## 2010-03-04 NOTE — Letter (Signed)
Summary: [facility]  [facility]   Imported By: Arta Bruce 06/25/2009 15:37:43  _____________________________________________________________________  External Attachment:    Type:   Image     Comment:   External Document

## 2010-03-04 NOTE — Progress Notes (Signed)
Summary: Wants xray results  Phone Note Call from Patient   Summary of Call: PT WOULD LIKE FOR YOU TO GIVE HER A CALL WHEN DR. 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   She really needs to rest the foot for it to improve. Follow-up by: Julieanne Manson MD,  December 12, 2009 8:18 PM  Additional Follow-up for Phone Call Additional follow up Details #1::        Left message on answering machine for pt. to return call.  Dutch Quint RN  December 16, 2009 1:02 PM     Additional Follow-up for Phone Call Additional follow up Details #2::    It just started feeling better on Friday, but someone stepped on left foot where it was injured before, and now she can't put on her sneakers.   No bruising, just a little tender and swollen.  Advised to utilized RICE, but especially to keep off of it as much as possible, to use OTC medications as directed, as needed for pain.  Verbalized understanding.  Dutch Quint RN  December 16, 2009 2:31 PM

## 2010-03-04 NOTE — Letter (Signed)
Summary: [facility]  [facility]   Imported By: Arta Bruce 10/01/2009 15:53:36  _____________________________________________________________________  External Attachment:    Type:   Image     Comment:   External Document

## 2010-03-04 NOTE — Letter (Signed)
Summary: [facility]  [facility]   Imported By: Arta Bruce 06/25/2009 15:19:08  _____________________________________________________________________  External Attachment:    Type:   Image     Comment:   External Document

## 2010-03-04 NOTE — Letter (Signed)
Summary: [facility]  [facility]   Imported By: Arta Bruce 09/24/2009 12:51:10  _____________________________________________________________________  External Attachment:    Type:   Image     Comment:   External Document

## 2010-03-04 NOTE — Letter (Signed)
Summary: *HSN Results Follow up  [facility]  [address] [place], [place] [address]   Phone: [phone]  Fax: [phone]      03/19/2009   KERYN NESSLER [address] [place], [place]  [address]   Dear  Ms. Lakecia Melaragno,                            ____S.Drinkard,FNP   ____D. Gore,FNP       ____B. McPherson,MD   ____V. Rankins,MD    __x__E. Mulberry,MD    ____N. Daphine Deutscher, FNP  ____D. Reche Dixon, MD    ____K. Philipp Deputy, MD    ____Other     This letter is to inform you that your recent test(s):  _______Pap Smear    _______Lab Test     _______X-ray    _______ is within acceptable limits  _______ requires a medication change  ___xx____ requires a follow-up lab visit  _______ requires a follow-up visit with your provider   Comments:  Please call to schedule a lab appointment.  Thank you.       _________________________________________________________ If you have any questions, please contact our office                     Sincerely,  Vesta Mixer CMA [facility]

## 2010-03-04 NOTE — Assessment & Plan Note (Signed)
Summary: LEFTFOOT PAIN//KT   Vital Signs:  Patient profile:   35 year old female Menstrual status:  regular Weight:      192.38 pounds Temp:     97.8 degrees F oral Pulse rate:   90 / minute Pulse rhythm:   regular Resp:     20 per minute BP sitting:   112 / 68  (left arm) Cuff size:   regular  Vitals Entered By: Chauncy Passy, CMA CC: Pt. is here complaining of left foot pain. Her foot is swollen and tender. Pt. believes this is due to her dancing last week, for an [employer] play, when she was putting pressure on her feet. The pain has gotten worse last night.  Also, pt. is complaining of right hip pain not associated w/her foot pain.  Is Patient Diabetic? No Pain Assessment Patient in pain? yes     Location: left foot Intensity: 7/8 Type: dull Onset of pain  Constant - Especially w/activity  Does patient need assistance? Functional Status Self care Ambulation Normal   CC:  Pt. is here complaining of left foot pain. Her foot is swollen and tender. Pt. believes this is due to her dancing last week, for an [employer] play, when she was putting pressure on her feet. The pain has gotten worse last night.  Also, and pt. is complaining of right hip pain not associated w/her foot pain. Marland Kitchen  History of Present Illness: 1.  Left foot pain:  Started last week--started like almost a muscle cramp.  Mainly at base of 2nd toe--dorsal area.  Tried to stretch out.  Gradually worsened until decided to be seen.  Usually just hurts when walks on it.  In past 24 hours, throbbed all the time and difficult to get shoe on.  Involved in an African dance for a black nativity at [employer].  Does repetitive pressure to balls of feet.  Has been practicing regularly for about 3 weeks.  Pt. states she has been told her SLE is under good control recently.  Following with Drs Hyman Hopes and Carolan Clines.  No history of specific one time injury.  No other joint complaints  Current Medications (verified): 1)  Plaquenil 200 Mg  Tabs (Hydroxychloroquine Sulfate) .Marland Kitchen.. 1 By Mouth Two Times A Day 2)  Prednisone 5 Mg Tabs (Prednisone) .Marland Kitchen.. 1 By Mouth Once Daily 3)  Mobic 7.5 Mg Tabs (Meloxicam) .Marland Kitchen.. 1-2 Tabs By Mouth Up To Two Times A Day 4)  Cellcept 500 Mg Tabs (Mycophenolate Mofetil) .Marland Kitchen.. 1 Tab By Mouth Two Times A Day 5)  Ciprofloxacin Hcl 500 Mg Tabs (Ciprofloxacin Hcl) .Marland Kitchen.. 1 Tab By Mouth Two Times A Day For 7 Days 6)  Ferrous Sulfate 325 (65 Fe) Mg Tabs (Ferrous Sulfate) .Marland Kitchen.. 1 Tab By Mouth Daily 7)  Omeprazole 20 Mg Cpdr (Omeprazole) .Marland Kitchen.. 1 Cap By Mouth 1/2 Hour Before Breakfast. 8)  Ciprofloxacin Hcl 500 Mg Tabs (Ciprofloxacin Hcl) .Marland Kitchen.. 1 Tab By Mouth Two Times A Day For 7 Days 9)  Zyrtec Allergy 10 Mg Tabs (Cetirizine Hcl) .... Take 1 Tablet By Mouth Once A Day As Needed For Itching 10)  Magic Mouthwash .... 5 Ml Swish and Spit Every 4 Hours As Needed  Allergies (verified): 1)  Sulfa  Physical Exam  General:  NAD Extremities:  Mild swelling and mild-moderate tenderness over dorsal aspect of 2nd distal metatarsal.  Pain with compression of metatarsal heads as well.  Decreased ROM of toes secondary to pain   Impression & Recommendations:  Problem # 1:  FOOT PAIN, LEFT (ICD-729.5) supportive/comfort care until see xrays. Orders: Diagnostic X-Ray/Fluoroscopy (Diagnostic X-Ray/Flu)  Complete Medication List: 1)  Plaquenil 200 Mg Tabs (Hydroxychloroquine sulfate) .Marland Kitchen.. 1 by mouth two times a day 2)  Prednisone 10 Mg Tabs (Prednisone) .... 2 tabw by mouth daily--dr. o'rourke 3)  Cellcept 500 Mg Tabs (Mycophenolate mofetil) .... 3 tabs by mouth two times a day 4)  Ferrous Sulfate 325 (65 Fe) Mg Tabs (Ferrous sulfate) .Marland Kitchen.. 1 tab by mouth daily 5)  Omeprazole 20 Mg Cpdr (Omeprazole) .Marland Kitchen.. 1 cap by mouth 1/2 hour before breakfast. 6)  Zyrtec Allergy 10 Mg Tabs (Cetirizine hcl) .... Take 1 tablet by mouth once a day as needed for itching 7)  Open Toed Wooden Shoe--flat  .... Left foot for swelling and pain   729.5  Patient Instructions: 1)  Elevate and ice foot for 20 minutes three times a day  2)  Try finding a hard, flat open toe shoe to wear on left Prescriptions: OPEN TOED WOODEN SHOE--FLAT left foot for swelling and pain  729.5  #1 x 0   Entered and Authorized by:   Julieanne Manson MD   Signed by:   Julieanne Manson MD on 12/10/2009   Method used:   Print then Give to Patient   RxID:   [id number]    Orders Added: 1)  Diagnostic X-Ray/Fluoroscopy [Diagnostic X-Ray/Flu] 2)  Est. Patient Level II [38756]

## 2010-03-04 NOTE — Letter (Signed)
Summary: [facility]  [facility]   Imported By: Arta Bruce 01/03/2010 09:07:11  _____________________________________________________________________  External Attachment:    Type:   Image     Comment:   External Document

## 2010-03-04 NOTE — Assessment & Plan Note (Signed)
Summary: 2209 pt/ lupus probably coming back / gk   Vital Signs:  Patient profile:   35 year old female Menstrual status:  regular Height:      68 inches Weight:      187 pounds BMI:     28.54 Temp:     99.2 degrees F oral Pulse rate:   116 / minute Pulse rhythm:   regular Resp:     18 per minute BP sitting:   130 / 87  (left arm) Cuff size:   regular  Vitals Entered By: [place] Shannon (May 08, 2009 11:57 AM) CC: pt says her body is sore and her throat is sore and pt don't think she is coming down with a cold but that she thinks its her lupus acting up Is Patient Diabetic? No Pain Assessment Patient in pain? no       Does patient need assistance? Functional Status Self care Ambulation Normal   CC:  pt says her body is sore and her throat is sore and pt don't think she is coming down with a cold but that she thinks its her lupus acting up.  History of Present Illness: Here for sore throat since last week (5 days). Low grade temp. No chills. + cough - occ sputum that is yellow  Notes some chest pain.  Thinks from acid reflux.  Had heartburn last night.  Did not take tums.  Swallowed some toothpaste.  Water helped. No radiating symptoms.  No syncope.  No dyspnea. No otalgia. + headache - mainly frontal Worried about Lupus flare.  States she has had more myalgias and joint pain (hands). Nephrologist has increased her dose of cellcept and she is getting weened on her prednisone.  Now on 10 mg a day since March.  Joint pain started last week when weather started to change. Not missing any meds.   Current Medications (verified): 1)  Plaquenil 200 Mg Tabs (Hydroxychloroquine Sulfate) .Marland Kitchen.. 1 By Mouth Two Times A Day 2)  Prednisone 5 Mg Tabs (Prednisone) .Marland Kitchen.. 1 By Mouth Once Daily 3)  Mobic 7.5 Mg Tabs (Meloxicam) .Marland Kitchen.. 1-2 Tabs By Mouth Up To Two Times A Day 4)  Cellcept 500 Mg Tabs (Mycophenolate Mofetil) .Marland Kitchen.. 1 Tab By Mouth Two Times A Day 5)  Ciprofloxacin Hcl 500 Mg  Tabs (Ciprofloxacin Hcl) .Marland Kitchen.. 1 Tab By Mouth Two Times A Day For 7 Days 6)  Ferrous Sulfate 325 (65 Fe) Mg Tabs (Ferrous Sulfate) .Marland Kitchen.. 1 Tab By Mouth Daily 7)  Omeprazole 20 Mg Cpdr (Omeprazole) .Marland Kitchen.. 1 Cap By Mouth 1/2 Hour Before Breakfast. 8)  Ciprofloxacin Hcl 500 Mg Tabs (Ciprofloxacin Hcl) .Marland Kitchen.. 1 Tab By Mouth Two Times A Day For 7 Days 9)  Zyrtec Allergy 10 Mg Tabs (Cetirizine Hcl) .... Take 1 Tablet By Mouth Once A Day As Needed For Itching  Allergies (verified): 1)  Sulfa  Physical Exam  General:  alert, well-developed, and well-nourished.   Head:  normocephalic and atraumatic.   Eyes:  pupils equal, pupils round, and pupils reactive to light.   Ears:  R ear normal and L ear normal.   Nose:  no external deformity.   Mouth:  post pharynx with an area of erythema on the uvula with appearance of annular ring with central clearing Neck:  supple and no cervical lymphadenopathy.   Lungs:  normal breath sounds, no crackles, and no wheezes.   Heart:  normal rate and regular rhythm.   Abdomen:  soft, non-tender, normal bowel  sounds, and no hepatomegaly.   Neurologic:  alert & oriented X3 and cranial nerves II-XII intact.   Psych:  normally interactive.     Impression & Recommendations:  Problem # 1:  SORE THROAT (ICD-462)  ? fungal infection get culture and smear asked Dr. Delrae Alfred to also look at patient today she feels the area on her uvula looks like an ulcer that was unroofed will tx with magic mouthwash  Her updated medication list for this problem includes:    Mobic 7.5 Mg Tabs (Meloxicam) .Marland Kitchen... 1-2 tabs by mouth up to two times a day    Ciprofloxacin Hcl 500 Mg Tabs (Ciprofloxacin hcl) .Marland Kitchen... 1 tab by mouth two times a day for 7 days  Orders: T-Culture, Throat ([id number]) T-Culture & Smear Fungus ([id number])  Problem # 2:  SLE (ICD-710.0) see above ulcer may be evidence of flare of her lupus Dr. Delrae Alfred to call her Rheum and discuss case advised patient  to get back in as soon as she can to Dr. Carolan Clines check C3 and C4 and anti ds DNA as requested by rheumatologist at [facility] (Dr. Louann Liv)  Her updated medication list for this problem includes:    Plaquenil 200 Mg Tabs (Hydroxychloroquine sulfate) .Marland Kitchen... 1 by mouth two times a day    Prednisone 5 Mg Tabs (Prednisone) .Marland Kitchen... 1 by mouth once daily    Mobic 7.5 Mg Tabs (Meloxicam) .Marland Kitchen... 1-2 tabs by mouth up to two times a day  Orders: T- * Misc. Laboratory test [phone]) T-dsDNA Assay [phone]) T-Sed Rate (Automated) [phone])  Complete Medication List: 1)  Plaquenil 200 Mg Tabs (Hydroxychloroquine sulfate) .Marland Kitchen.. 1 by mouth two times a day 2)  Prednisone 5 Mg Tabs (Prednisone) .Marland Kitchen.. 1 by mouth once daily 3)  Mobic 7.5 Mg Tabs (Meloxicam) .Marland Kitchen.. 1-2 tabs by mouth up to two times a day 4)  Cellcept 500 Mg Tabs (Mycophenolate mofetil) .Marland Kitchen.. 1 tab by mouth two times a day 5)  Ciprofloxacin Hcl 500 Mg Tabs (Ciprofloxacin hcl) .Marland Kitchen.. 1 tab by mouth two times a day for 7 days 6)  Ferrous Sulfate 325 (65 Fe) Mg Tabs (Ferrous sulfate) .Marland Kitchen.. 1 tab by mouth daily 7)  Omeprazole 20 Mg Cpdr (Omeprazole) .Marland Kitchen.. 1 cap by mouth 1/2 hour before breakfast. 8)  Ciprofloxacin Hcl 500 Mg Tabs (Ciprofloxacin hcl) .Marland Kitchen.. 1 tab by mouth two times a day for 7 days 9)  Zyrtec Allergy 10 Mg Tabs (Cetirizine hcl) .... Take 1 tablet by mouth once a day as needed for itching 10)  Magic Mouthwash  .... 5 ml swish and spit every 4 hours as needed  Patient Instructions: 1)  Schedule follow up with Dr. Carolan Clines as soon as you can. 2)  Follow up with Dr. Delrae Alfred as scheduled. 3)  Use Magic Mouthwash no more than every 4 hours.  Swish and spit one teaspoon. Prescriptions: MAGIC MOUTHWASH 5 mL swish and spit every 4 hours as needed  #200 mL x 0   Entered and Authorized by:   Tereso Newcomer PA-C   Signed by:   Tereso Newcomer PA-C on 05/08/2009   Method used:   Print then Give to Patient   RxID:   [id number]

## 2010-03-04 NOTE — Letter (Signed)
Summary: [facility]  [facility]   Imported By: Arta Bruce 05/14/2009 09:53:44  _____________________________________________________________________  External Attachment:    Type:   Image     Comment:   External Document

## 2010-03-06 NOTE — Letter (Signed)
Summary: [facility]   Imported By: Arta Bruce 01/17/2010 12:37:49  _____________________________________________________________________  External Attachment:    Type:   Image     Comment:   External Document

## 2010-03-06 NOTE — Progress Notes (Signed)
Summary: NEEDS REF TO FOOT DR.  Phone Note Call from Patient Call back at Home Phone [phone]   Reason for Call: Referral Summary of Call: MULBERRY PT. MS [place] TALK WITH HER LUPUS DR AND THEY DID ANOTHER X-RAY ON HER FOOT AND SHE WAS TOLD THAT SHE HAS A SMALL STRESS FRACTURE AND THAT SHE NEEDS TO BE SEEN BY A FOOT DR SO SHE CAN GET FITTED FOR A BOOT, OR  BY HER PRIMARY DR. Initial call taken by: Leodis Rains,  December 31, 2009 10:18 AM  Follow-up for Phone Call        Go ahead with referral--please obtain Rheumtology notes to send to foot clinic ([facility] please)   Actually, I have the Rheum notes, but not the xray to show the stress fracture Follow-up by: Julieanne Manson MD,  December 31, 2009 4:02 PM  Additional Follow-up for Phone Call Additional follow up Details #1::        Please also notify pt. she needs to come in for monthly CBC and CMet for V58.69--schedule next 6 months of labs visits on or near the 23rd of each month--next in December please She also needs to come in for UA by beginning of next week for follow up UTI (599.0) Additional Follow-up by: Julieanne Manson MD,  January 02, 2010 10:28 AM    Additional Follow-up for Phone Call Additional follow up Details #2::    I schedule her an appt [facility] 01-07-10 I left her a voice mail to call me back .Marland KitchenCheryll Dessert  January 02, 2010 11:48 AM   Additional Follow-up for Phone Call Additional follow up Details #3:: Details for Additional Follow-up Action Taken: Will foward to a sched.... Ms. Selena Batten, can you please call pt. and sched. her monthly CBC and CMet for V58.69. Also, schedule next 6 months of labs visits on or near the 23rd of each month--next in December please. She also needs to come in for UA by beginning of next week for follow up UTI (599.0)...Marland KitchenMarland KitchenHale Drone CMA  January 09, 2010 9:35 AM  LEFT MESG TO CALL OFFICE.Cala Bradford Tinnin  January 10, 2010 9:54 AM SPOKE WITH PATIENT AND MAD AN  APPT. AND ALSO TOLD HER THAT SHE HAS TO COME FOR 6 MONTHS STRAIGHT.   Additional Follow-up by: Leodis Rains,  January 16, 2010 10:47 AM  New/Updated Medications: PREDNISONE 10 MG TABS (PREDNISONE) 2 tabs by mouth daily--Dr. Carolan Clines

## 2010-03-26 ENCOUNTER — Encounter (INDEPENDENT_AMBULATORY_CARE_PROVIDER_SITE_OTHER): Payer: Self-pay | Admitting: Internal Medicine

## 2010-03-29 ENCOUNTER — Telehealth (INDEPENDENT_AMBULATORY_CARE_PROVIDER_SITE_OTHER): Payer: Self-pay | Admitting: Internal Medicine

## 2010-04-01 NOTE — Letter (Signed)
Summary: OUTPATIENT CHEMISTRY RESULT  OUTPATIENT CHEMISTRY RESULT   Imported By: Arta Bruce 03/27/2010 14:58:26  _____________________________________________________________________  External Attachment:    Type:   Image     Comment:   External Document

## 2010-04-10 NOTE — Letter (Signed)
Summary: RHEUMATOLOGY & CLINICAL IMMUNOLOGY  RHEUMATOLOGY & CLINICAL IMMUNOLOGY   Imported By: Arta Bruce 04/03/2010 16:24:09  _____________________________________________________________________  External Attachment:    Type:   Image     Comment:   External Document

## 2010-04-15 NOTE — Letter (Signed)
Summary: [facility]   Imported By: Arta Bruce 04/07/2010 15:08:05  _____________________________________________________________________  External Attachment:    Type:   Image     Comment:   External Document

## 2010-04-15 NOTE — Progress Notes (Signed)
Summary: OPHTALMOLOGY REFERRAL   Phone Note Outgoing Call   Summary of Call: PLease check with pt. and make sure she is getting every 6 month eye exam as she is on Plaquenil. If she is--find out where she is going please Initial call taken by: Julieanne Manson MD,  March 29, 2010 12:57 PM  Follow-up for Phone Call        Dr. Delrae Alfred -- called pt. -- She said she went to [facility] to get her eyes checked in the middle of January of this year. -- She was told she had a little bit of glaucoma and they were going to give her a referral to see an ophthalmologist but has not heard anything -- Adv. pt. to call [facility] and see where they are w/the referral -- Pt. wants to know if we can set up a referral?? -- [facility]  March 31, 2010 3:44 PM   Additional Follow-up for Phone Call Additional follow up Details #1::        She actually probably needs to see an ophthalmologist as she is on Plaquenil --needs regular 6 month eye exam for this--see if we can get her in via [facility] or if Dr. Laruth Bouchard office would be willing to work with her billing wise. Additional Follow-up by: Julieanne Manson MD,  April 01, 2010 10:10 AM    Additional Follow-up for Phone Call Additional follow up Details #2::    PT HAS AN APPT [facility]  04-29-10 @ 10 AM PT AWARE OF HER APPT  Follow-up by: Cheryll Dessert,  April 07, 2010 11:45 AM

## 2010-05-06 LAB — CBC
Hemoglobin: 12.6 g/dL (ref 12.0–15.0)
Platelets: 207 10*3/uL (ref 150–400)
RDW: 15.3 % (ref 11.5–15.5)

## 2010-05-06 LAB — PROTIME-INR
INR: 1 (ref 0.00–1.49)
Prothrombin Time: 13.1 seconds (ref 11.6–15.2)

## 2010-05-06 LAB — APTT: aPTT: 31 seconds (ref 24–37)

## 2010-06-17 NOTE — Op Note (Signed)
Christine Todd, Christine Todd                ACCOUNT NO.:  [id number]   MEDICAL RECORD NO.:  [id number]          PATIENT TYPE:  AMB   LOCATION:  SDC                           FACILITY:  [facility]   PHYSICIAN:  Juluis Mire, M.D.   DATE OF BIRTH:  05-Jul-1975   DATE OF PROCEDURE:  03/07/2007  DATE OF DISCHARGE:                               OPERATIVE REPORT   PREOPERATIVE DIAGNOSIS:  Retained products of pregnancy.   POSTOPERATIVE DIAGNOSIS:  Retained products of pregnancy.   PROCEDURE:  Paracervical block.  Intrauterine curetting and suctioning.   SURGEON:  Juluis Mire, M.D.   ANESTHESIA:  Sedation, with paracervical block.   ESTIMATED BLOOD LOSS:  Minimal.   PACKS AND DRAINS:  None.   INTRAOPERATIVE BLOOD PLACED:  None.   COMPLICATIONS:  None.   INDICATIONS:  Dictated in the history and physical.   PROCEDURE:  The patient was taken to the O.R. and placed in the supine  position.  After sedation, the patient was placed in the dorsal  lithotomy position using the Allen stirrups.  The patient was then  draped in a sterile field.  A speculum was placed in the vaginal vault.  The vagina and cervix were cleansed with Betadine.  Paracervical block  with 1% Xylocaine was instituted.  The cervix was secured with a single-  tooth tenaculum.  The cervix was already dilated, and no dilatation was  required.  A size 8 curved suction curette was easily introduced into  the intrauterine cavity.  The intrauterine contents were then removed  using suction curetting.  We continued this until no tissue was  obtained.  At this point in time, sharp curetting revealed all quadrants  to be clear.  Repeat suction and curetting revealed no additional  tissue.  The uterus was contracting down well, and bleeding was minimal.  Single-tooth tenaculum and speculum were removed.  The patient was taken  out of the dorsal lithotomy position and transferred to the recovery  room in good condition.  Sponge,  instrument, and needle count was  reported as correct by the circulating nurse.      Juluis Mire, M.D.  Electronically Signed    JSM/MEDQ  D:  03/07/2007  T:  03/07/2007  Job:  [id number]

## 2010-06-20 NOTE — Discharge Summary (Signed)
NAMEVERBIE, BABIC                ACCOUNT NO.:  [id number]   MEDICAL RECORD NO.:  [id number]          PATIENT TYPE:  INP   LOCATION:  3734                         FACILITY:  [facility]   PHYSICIAN:  Lonia Blood, M.D.       DATE OF BIRTH:  [birth year]   DATE OF ADMISSION:  03/27/2005  DATE OF DISCHARGE:  03/31/2005                                 DISCHARGE SUMMARY   PRIMARY CARE PHYSICIAN:  [facility] which makes the patient unassigned for  [facility].   DISCHARGE DIAGNOSES:  1.  Systemic lupus erythematosus flare.  2.  Pneumonia.  3.  Migraine headaches.  4.  Post lumbar puncture headaches, resolved.  5.  Hypokalemia secondary to steroids, resolved.  6.  Hyponatremia, resolved.  7.  Anemia of chronic disease.   DISCHARGE MEDICATIONS:  1.  Prednisone 60 mg by mouth daily.  2.  Plaquenil 200 mg by mouth twice a day.  3.  Cellcept 1000 mg by mouth twice a day.  4.  Mobic 7.5 mg by mouth daily.  5.  Avelox 400 mg by mouth daily for 3 days.  6.  Tramadol 500 mg by mouth every 8 hours as needed for pain.  7.  Reglan 5 mg by mouth every 6 hours as needed for nausea.   CONDITION ON DISCHARGE:  Ms. Bok was discharged in fair condition. At the  time of discharge, she was still having some residual headaches. She was  able to eat a regular diet. She was able to ambulate without any difficulty.  She was discharged, to follow up with Dr. Carolan Clines at [facility] and to follow up  with [facility], who is her primary care physician.   PROCEDURE DURING THIS ADMISSION:  1.  On March 27, 2005, Ms. Dierolf underwent a lumbar puncture by Dr.      Frazier Richards.  2.  On March 29, 2005, the patient underwent a 2D echocardiogram that did      not show any pericardial effusion.   HISTORY OF PRESENT ILLNESS:  For admission history and physical please refer  to dictated H&P done by Dr. Catalina Pizza on March 26, 2005.   Briefly, Ms.  Berryman is a 35 year old African American woman, with a history  of lupus, was admitted with fever, headaches and cough.   HOSPITAL COURSE:  1.  Febrile illness:  Ms. Dosch was admitted with a presumed diagnosis of      possible pneumonia versus possible meningitis. Ms. Kanner underwent a      lumbar puncture with CSF analysis. Her CSF studies were all within      normal limits, including cryptococcal antigen, protein levels, glucose      levels and cultures. Ms. Buley improved significantly with Avelox      intravenously and at the time of discharge she was afebrile, with a      normal white blood cell count of 5.3.  2.  Systemic lupus erythematosus:  Ms. Gasser seemed to have a lupus flare      based on low  levels of C3, C4, elevated levels of CRP, elevated ESR,      very high levels of ANA, with a titer level of 1 per 1280. For this      reason, Ms. Travaglini was started on higher doses of prednisone at 60 mg      daily and she will continue that until she sees Dr. Kendell Bane at [facility]. Also, Ms. Olazabal was continued      on Plaquenil and Cellcept. She underwent a 2D echocardiogram that ruled      out presence of pericardial effusion.  3.  Headaches:  Ms. Suman had an episode of migraine headaches that actually      got worse after the post lumbar puncture. She responded to conservative      treatment and she did not require any blood patch.      Lonia Blood, M.D.  Electronically Signed     SL/MEDQ  D:  03/31/2005  T:  04/01/2005  Job:  [id number]

## 2010-06-20 NOTE — H&P (Signed)
Christine Todd, Christine Todd                ACCOUNT NO.:  [id number]   MEDICAL RECORD NO.:  [id number]          PATIENT TYPE:  INP   LOCATION:  3731                         FACILITY:  [facility]   PHYSICIAN:  Toby L. Fugate, D.O.   DATE OF BIRTH:  04-Oct-1975   DATE OF ADMISSION:  03/27/2005  DATE OF DISCHARGE:                                HISTORY & PHYSICAL   ADDENDUM ([id number]) REPORT   ADDENDUM NOTE:  The patient had a gynecological procedure this past Tuesday.  She had a Pap smear with biopsy.  Given this, I will have a low threshold  for obtaining imaging of the patient's pelvis.      Toby L. Fugate, D.O.  Electronically Signed     TLF/MEDQ  D:  03/26/2005  T:  03/27/2005  Job:  [id number]

## 2010-06-20 NOTE — H&P (Signed)
Christine Todd, Christine Todd                ACCOUNT NO.:  [id number]   MEDICAL RECORD NO.:  [id number]          PATIENT TYPE:  EMS   LOCATION:  ED                           [facility]ACILITY[facility]   PHYSICIAN:  Toby L. Fugate, D.O.   DATE OF BIRTH:  [birth year]   DATE OF ADMISSION:  03/26/2005  DATE OF DISCHARGE:                                HISTORY & PHYSICAL   PRIMARY CARE PHYSICIAN:  [facility]   RHEUMATOLOGIST:  Dr. Mechele Dawley of [facility].   CHIEF COMPLAINT:  Severe headache, fevers, chills, and numbness and tingling  of bilateral upper extremities.   HISTORY OF PRESENT ILLNESS:  Ms. Tucci is a 35 year old African-American  female who has past medical history of lupus. She is currently on  immunosuppressives with prednisone, Plaquenil, and CellCept. As stated  above, she is followed by Dr. Mechele Dawley at [facility]. She comes in  tonight complaining of a headache that developed yesterday. She does have  chronic migraines. However, she says this headache is very different than  the migraines that she has had in the past. She usually has what she refers  to as a baseline headache most days. However, her headache suddenly became  more severe yesterday. She also complains of fevers and chills that have  been present for about 2-3 days. She has not checked her temperature.  However, in the ED her temperature was 100.5. She says that with this  headache she has had some changes in her vision. In addition, she has had  numbness and tingling of both of her upper extremities. Last week she says  that she was having occasional chest pain with deep breath. She denies any  shortness of breath or cough. In the ED, she was found to have a significant  leukocytosis of 30,000. The differential showed a predominance of  neutrophils. A chest x-ray in the ED did show bilateral small infiltrates.  However, as stated above, there were really no symptoms of a pneumonia.  CT  of the head without contrast was unrevealing. Currently she continues to  have a mild headache. She does have stiffness of the neck. The stiffness of  the neck has improved somewhat since admission to the ED. She does continue  to feel as if she has a fever.   PAST MEDICAL HISTORY:  1.  Lupus.  2.  Migraine headaches.   MEDICATIONS:  1.  Prednisone 10 mg p.o. daily. This has been tapered down over the last      year from 60 mg p.o. daily.  2.  Plaquenil 200 mg one p.o. b.i.d.  3.  Mobic 7.5 mg two tablets p.o. daily.  4.  CellCept 500 mg two tablets p.o. b.i.d.   ALLERGIES:  SULFA.   SOCIAL HISTORY:  She denies tobacco, alcohol, and IV drug abuse. She is a  Consulting civil engineer. There has been no travel history.   FAMILY HISTORY:  No chronic medical problems.   REVIEW OF SYSTEMS:  A complete 12-point review of systems was obtained. The  review is negative except for that stated in  the HPI.   PHYSICAL EXAMINATION:  VITAL SIGNS:  Temperature 100.5, blood pressure  109/64, pulse 126, respiratory rate 18.  HEENT:  Pupils equally round and reactive to light. Extraocular muscles  intact. No scleral icterus. Oropharynx clear, moist. No erythema or thrush.  NECK:  No JVD, no carotid bruit, no adenopathy.  HEART:  Regular rate and rhythm. No murmurs, rubs, or gallops.  LUNGS:  Clear to auscultation bilaterally. No wheezes, rales, or rhonchi.  ABDOMEN:  Positive bowel sounds, nontender, nondistended.  EXTREMITIES:  No edema, clubbing, or cyanosis.  NEUROLOGIC:  Cranial nerves II-XII intact. The patient did have minimal neck  rigidity. Strength was 5/5 in all extremities. DTRs were 2/4 in all  extremities.   LABORATORY DATA:  Sodium 133, potassium 3.8, chloride 101, carbon dioxide  23, glucose 83, BUN 16, creatinine 1.2, calcium 8.2. White blood cell count  30.4, hemoglobin 10.6, hematocrit 31.3, platelet count 199, neutrophils 96%,  lymphocytes 2%. UA was significant for moderate hemoglobin,  trace ketones,  30 mg/dL of protein, 0.2 mg/dL urobilinogen, trace leukocyte esterase. Urine  microscopy was essentially unremarkable. ESR 21.   CT of the head was unrevealing.   Chest x-ray showed bilateral small infiltrates.   ASSESSMENT AND PLAN:  1.  Headaches, neck stiffness, and fevers in a patient with lupus on      immunosuppressants. The differential includes meningitis (aseptic versus      bacterial) versus pneumonia versus other. Given the patient's      tachycardia and recent chest pain, I will admit her to a telemetry bed.      I am going to request that interventional radiology perform a lumbar      puncture. I will send cerebrospinal fluid for several studies including      cell count, gram stain, routine culture, fungal culture, AFB      smear/culture, VDRL, cryptococcal antigen, and bacterial antigens. I      will also ask that they obtain an opening pressure. I will start the      patient on ceftriaxone, vancomycin, and ampicillin. I am adding the      ampicillin since the patient has been on immunosuppressants for some      time. As such, Listeria is a concern. I am also going to start      azithromycin to cover possible community-acquired pneumonia (along with      ceftriaxone). I did discuss with the patient HIV risk factors. She is      sexually active. As such, I will order an HIV test.  2.  Leukocytosis. The etiology of this is not clear at this point. Based on      the differential, it appears that the patient likely has a bacterial      infection. I will repeat a CBC in the morning.  3.  Hyponatremia. I will provide normal saline.  4.  Anemia. I will request that the patient's stools be sent for hemoccult.      I will also request an iron panel, reticulocyte count, and ferritin.  5.  Lupus, on CellCept, Plaquenil, prednisone, and Mobic. I will continue      the patient's previously-mentioned regimen. I will check a CRP. 6.  The patient is a Full  Code.      Toby L. Fugate, D.O.  Electronically Signed    TLF/MEDQ  D:  03/26/2005  T:  03/27/2005  Job:  [id number]

## 2010-08-26 ENCOUNTER — Ambulatory Visit (INDEPENDENT_AMBULATORY_CARE_PROVIDER_SITE_OTHER): Payer: Medicaid Other

## 2010-08-26 ENCOUNTER — Inpatient Hospital Stay (INDEPENDENT_AMBULATORY_CARE_PROVIDER_SITE_OTHER)
Admission: RE | Admit: 2010-08-26 | Discharge: 2010-08-26 | Disposition: A | Discharge status: Home / Self Care | Payer: Medicaid Other | Source: Ambulatory Visit | Attending: Family Medicine | Admitting: Family Medicine

## 2010-08-26 DIAGNOSIS — M542 Cervicalgia: Secondary | ICD-10-CM

## 2010-08-27 ENCOUNTER — Emergency Department (HOSPITAL_COMMUNITY)
Admission: EM | Admit: 2010-08-27 | Discharge: 2010-08-28 | Disposition: A | Discharge status: Home / Self Care | Payer: Medicaid Other | Source: Home / Self Care | Attending: Emergency Medicine | Admitting: Emergency Medicine

## 2010-08-27 DIAGNOSIS — M542 Cervicalgia: Secondary | ICD-10-CM | POA: Insufficient documentation

## 2010-08-27 DIAGNOSIS — K219 Gastro-esophageal reflux disease without esophagitis: Secondary | ICD-10-CM | POA: Insufficient documentation

## 2010-08-27 DIAGNOSIS — M25519 Pain in unspecified shoulder: Secondary | ICD-10-CM | POA: Insufficient documentation

## 2010-08-27 DIAGNOSIS — M79609 Pain in unspecified limb: Secondary | ICD-10-CM | POA: Insufficient documentation

## 2010-08-27 DIAGNOSIS — S139XXA Sprain of joints and ligaments of unspecified parts of neck, initial encounter: Secondary | ICD-10-CM | POA: Insufficient documentation

## 2010-08-27 DIAGNOSIS — X58XXXA Exposure to other specified factors, initial encounter: Secondary | ICD-10-CM | POA: Insufficient documentation

## 2010-08-27 DIAGNOSIS — M329 Systemic lupus erythematosus, unspecified: Secondary | ICD-10-CM | POA: Insufficient documentation

## 2010-08-28 ENCOUNTER — Emergency Department (HOSPITAL_COMMUNITY): Payer: Medicaid Other

## 2010-08-28 ENCOUNTER — Emergency Department (HOSPITAL_COMMUNITY)
Admission: EM | Admit: 2010-08-28 | Discharge: 2010-08-28 | Disposition: A | Discharge status: Home / Self Care | Payer: Medicaid Other | Attending: Emergency Medicine | Admitting: Emergency Medicine

## 2010-08-28 DIAGNOSIS — K219 Gastro-esophageal reflux disease without esophagitis: Secondary | ICD-10-CM | POA: Insufficient documentation

## 2010-08-28 DIAGNOSIS — M79609 Pain in unspecified limb: Secondary | ICD-10-CM | POA: Insufficient documentation

## 2010-08-28 DIAGNOSIS — M542 Cervicalgia: Secondary | ICD-10-CM | POA: Insufficient documentation

## 2010-08-28 DIAGNOSIS — M25519 Pain in unspecified shoulder: Secondary | ICD-10-CM | POA: Insufficient documentation

## 2010-08-28 DIAGNOSIS — M329 Systemic lupus erythematosus, unspecified: Secondary | ICD-10-CM | POA: Insufficient documentation

## 2010-08-28 DIAGNOSIS — R209 Unspecified disturbances of skin sensation: Secondary | ICD-10-CM | POA: Insufficient documentation

## 2010-08-28 LAB — POCT PREGNANCY, URINE: Preg Test, Ur: NEGATIVE

## 2010-10-07 ENCOUNTER — Inpatient Hospital Stay (INDEPENDENT_AMBULATORY_CARE_PROVIDER_SITE_OTHER)
Admission: RE | Admit: 2010-10-07 | Discharge: 2010-10-07 | Disposition: A | Discharge status: Home / Self Care | Payer: Medicaid Other | Source: Ambulatory Visit | Attending: Family Medicine | Admitting: Family Medicine

## 2010-10-07 DIAGNOSIS — M799 Soft tissue disorder, unspecified: Secondary | ICD-10-CM

## 2010-10-07 DIAGNOSIS — G609 Hereditary and idiopathic neuropathy, unspecified: Secondary | ICD-10-CM

## 2010-10-07 DIAGNOSIS — G5600 Carpal tunnel syndrome, unspecified upper limb: Secondary | ICD-10-CM

## 2010-10-10 IMAGING — US US RENAL
1 series · 14 of 25 positions shown · non-contrast
Comparison: 01/09/2009

CLINICAL DATA: Left flank pain. Left renal biopsy 01/15/2009.

RENAL/URINARY TRACT ULTRASOUND COMPLETE

[Series 1: us renal · 0.29mm/px · 14 of 36 slices shown]
[im 1/36]
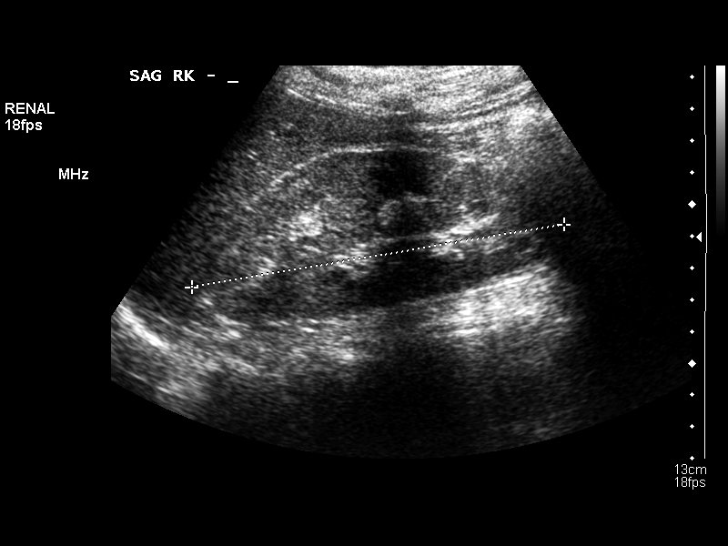
[im 3/36]
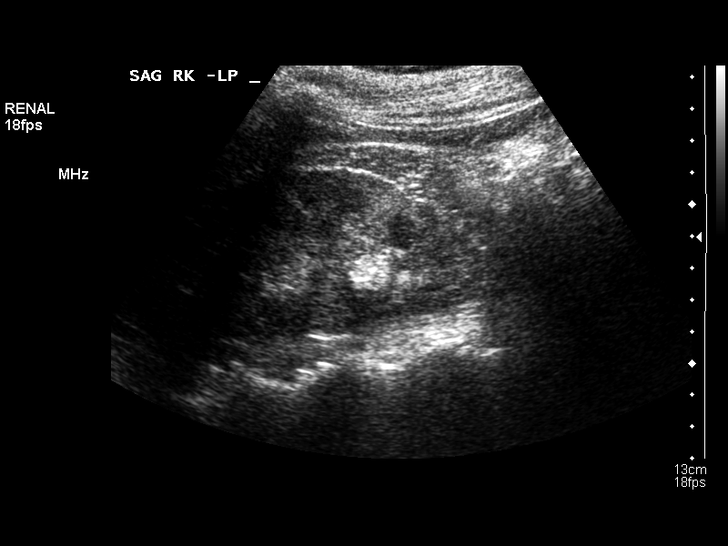
[im 6/36]
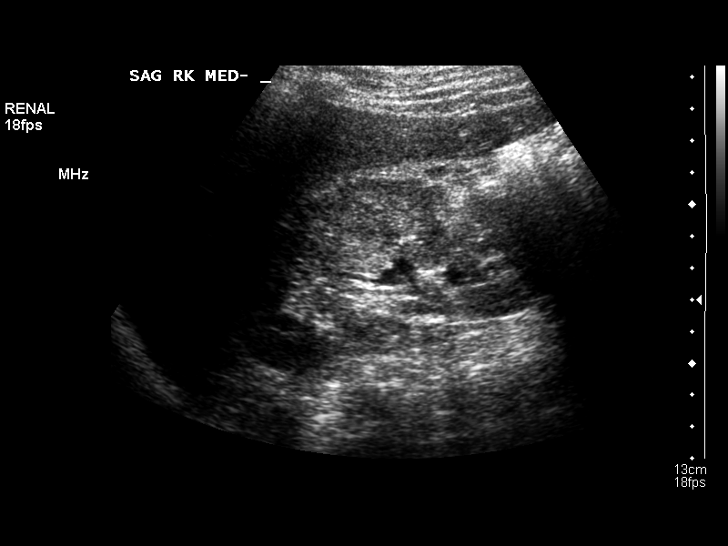
[im 9/36]
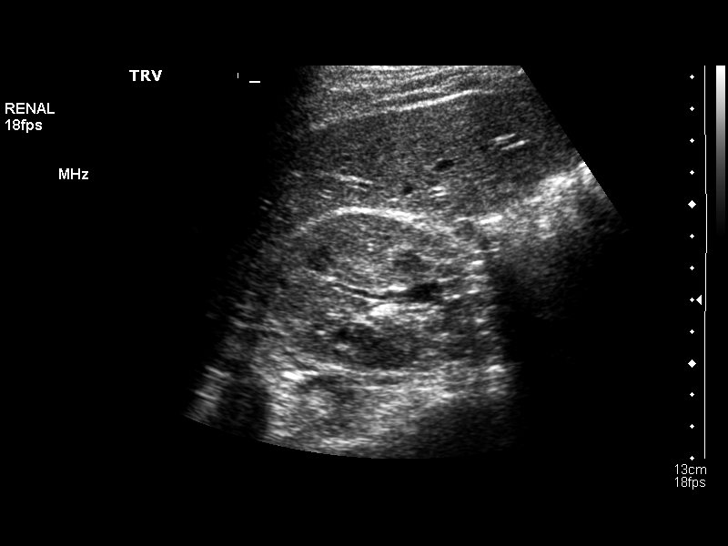
[im 12/36]
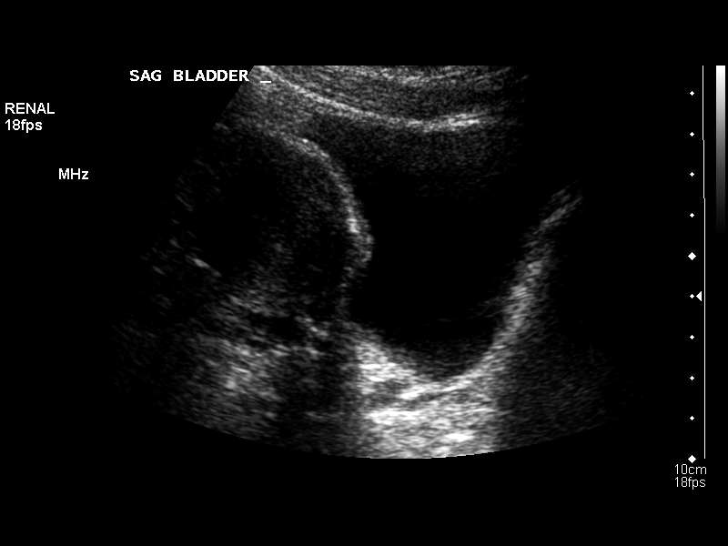
[im 14/36]
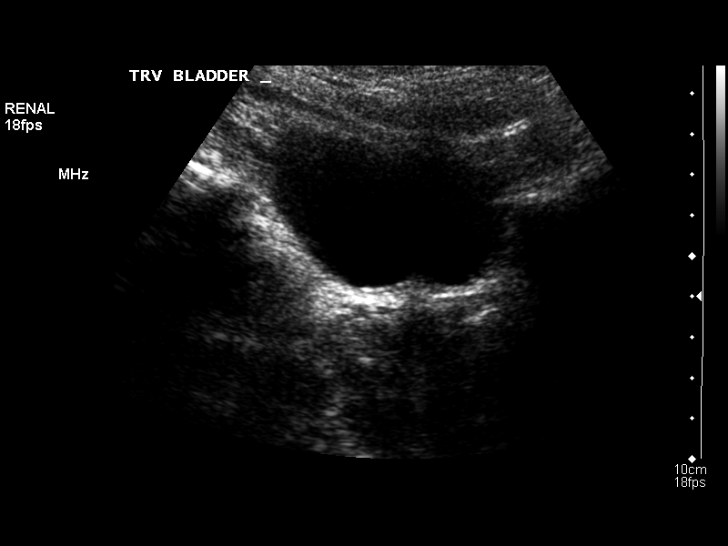
[im 17/36]
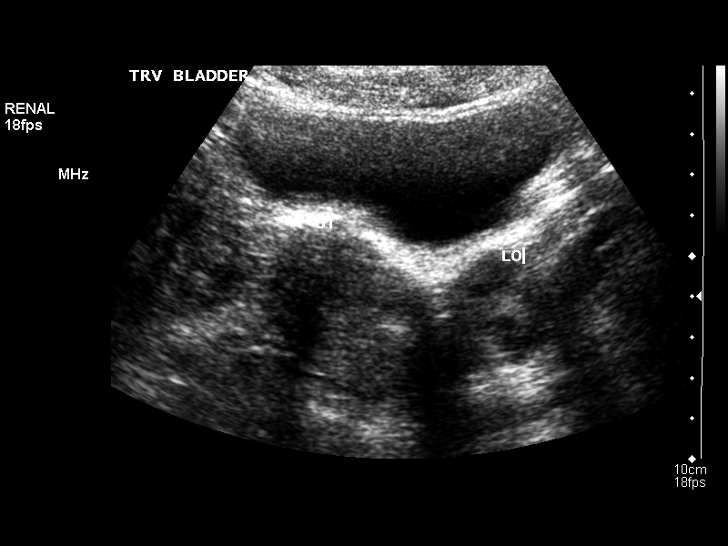
[im 19/36]
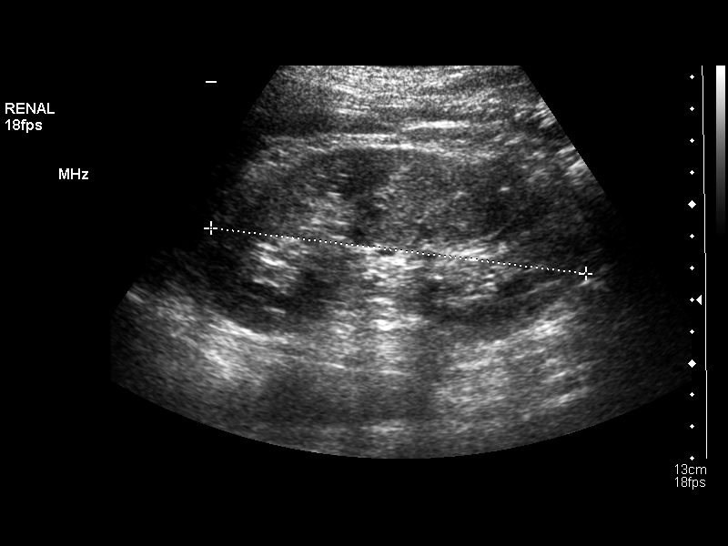
[im 22/36]
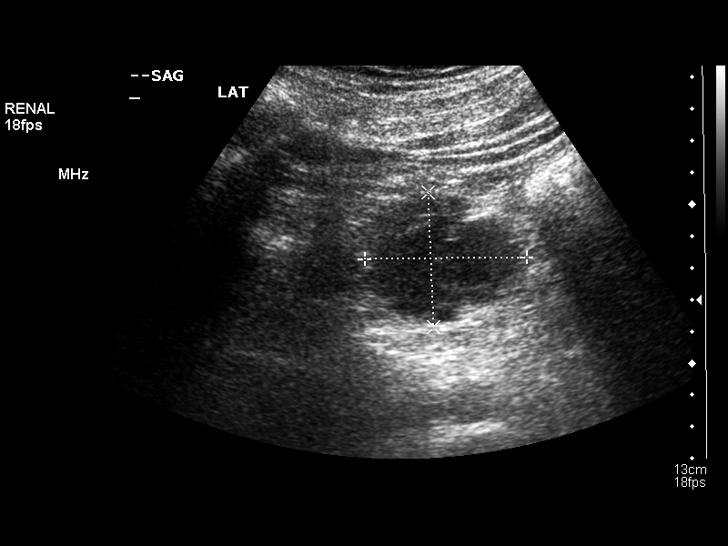
[im 24/36]
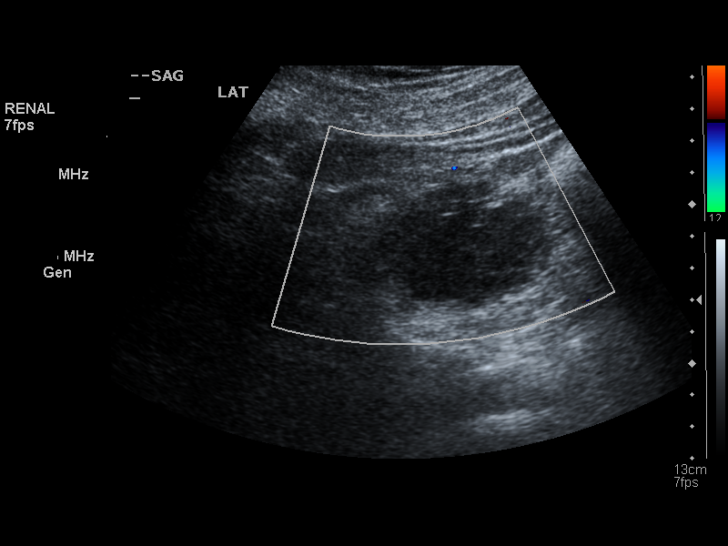
[im 27/36]
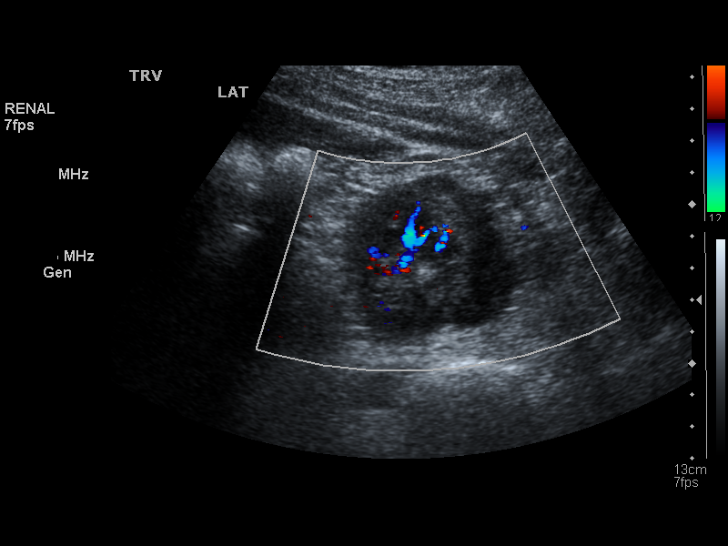
[im 30/36]
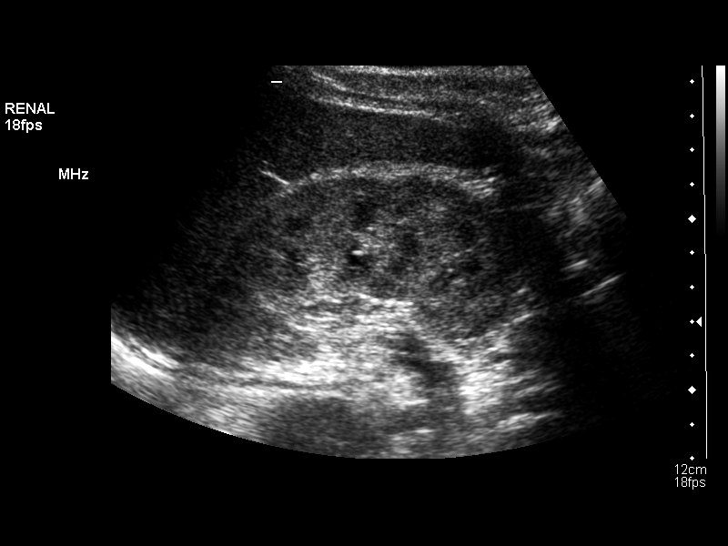
[im 33/36]
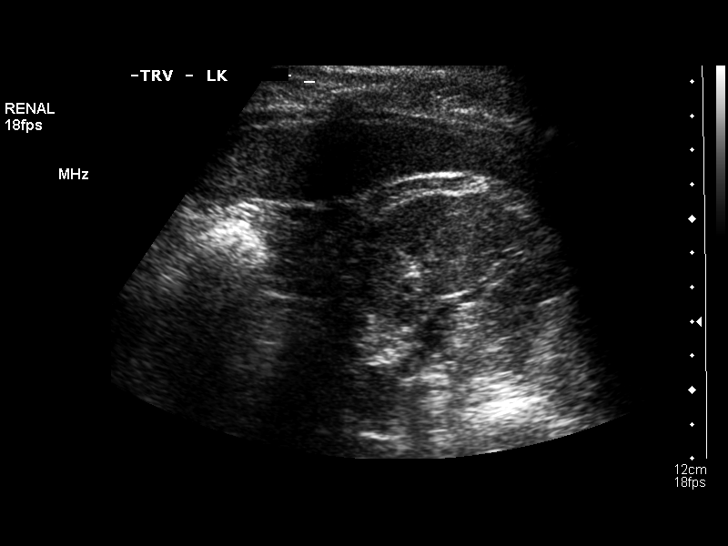
[im 36/36]
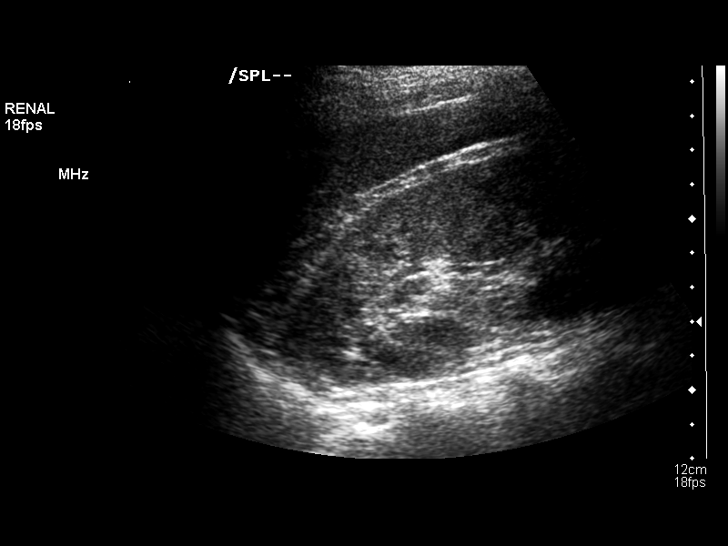

[14 of 25 positions shown; findings below may reference images not displayed]

FINDINGS: Right Kidney:  Measures 11.9 cm.  Parenchymal echogenicity is
slightly increased.

Left Kidney:  Measures 11.9 cm.  Parenchymal echogenicity is
slightly increased.  A hypoechoic structure is seen along the lower
pole of the left kidney, with increased through transmission.  It
measures 5.1 x 4.2 x 1.6 cm.  No internal blood flow.

Bladder:  Left ureteral jet is visualized.
IMPRESSION: 1.  A hypoechoic structure along the inferior pole of the left
kidney is suspicious for a a perinephric hematoma, in the setting
of recent renal biopsy.  Follow-up to clearing is recommended.
2.  Chronic medical renal disease.

## 2010-10-24 LAB — COMPREHENSIVE METABOLIC PANEL
AST: 84 — ABNORMAL HIGH
Albumin: 3.4 — ABNORMAL LOW
Alkaline Phosphatase: 56
BUN: 6
CO2: 24
Chloride: 109
Creatinine, Ser: 0.8
GFR calc Af Amer: >60
GFR calc non Af Amer: >60
Potassium: 3.7
Total Bilirubin: 0.3

## 2010-10-24 LAB — URINALYSIS, ROUTINE W REFLEX MICROSCOPIC
Bilirubin Urine: NEGATIVE
Bilirubin Urine: NEGATIVE
Glucose, UA: NEGATIVE
Ketones, ur: NEGATIVE
Nitrite: NEGATIVE
Protein, ur: NEGATIVE
Specific Gravity, Urine: 1.01
Specific Gravity, Urine: 1.025
Urobilinogen, UA: 0.2
pH: 7.5

## 2010-10-24 LAB — URINE MICROSCOPIC-ADD ON

## 2010-10-24 LAB — DIFFERENTIAL
Basophils Absolute: 0
Basophils Relative: 1
Eosinophils Relative: 2
Monocytes Absolute: 0.2

## 2010-10-24 LAB — CBC
HCT: 30.9 — ABNORMAL LOW
HCT: 24.8 — ABNORMAL LOW
Hemoglobin: 8.4 — ABNORMAL LOW
MCV: 87.6
MCV: 89.3
Platelets: 146 — ABNORMAL LOW
Platelets: 165
Platelets: 155
RBC: 3.31 — ABNORMAL LOW
RBC: 3.46 — ABNORMAL LOW
RBC: 2.81 — ABNORMAL LOW
RDW: 14.3
WBC: 4.7
WBC: 4
WBC: 5
WBC: 7

## 2010-10-24 LAB — HCG, QUANTITATIVE, PREGNANCY: hCG, Beta Chain, Quant, S: 787 — ABNORMAL HIGH

## 2010-10-24 LAB — ABO/RH: ABO/RH(D): O POS

## 2010-11-03 LAB — CBC
Hemoglobin: 10.5 — ABNORMAL LOW
MCHC: 32.5
Platelets: 167
RDW: 15.1

## 2010-11-03 LAB — URINE MICROSCOPIC-ADD ON

## 2010-11-03 LAB — URINALYSIS, ROUTINE W REFLEX MICROSCOPIC
Glucose, UA: NEGATIVE
Leukocytes, UA: NEGATIVE
Protein, ur: >300 — AB
Specific Gravity, Urine: 1.021
pH: 6.5

## 2010-11-03 LAB — DIFFERENTIAL
Basophils Absolute: 0
Basophils Relative: 0
Monocytes Absolute: 0.4
Neutro Abs: 7.4

## 2010-11-03 LAB — RPR: RPR Ser Ql: NONREACTIVE

## 2010-11-03 LAB — POCT I-STAT, CHEM 8
Calcium, Ion: 1.16
Chloride: 102
HCT: 34 — ABNORMAL LOW
Sodium: 138
TCO2: 28

## 2010-11-03 LAB — WET PREP, GENITAL

## 2010-11-03 LAB — GC/CHLAMYDIA PROBE AMP, GENITAL: GC Probe Amp, Genital: NEGATIVE

## 2010-11-03 LAB — HCG, QUANTITATIVE, PREGNANCY: hCG, Beta Chain, Quant, S: <2

## 2010-11-04 LAB — CULTURE, ROUTINE-ABSCESS: Gram Stain: NONE SEEN

## 2010-11-21 ENCOUNTER — Ambulatory Visit: Payer: Medicaid Other | Attending: Orthopedic Surgery | Admitting: Physical Therapy

## 2011-03-03 ENCOUNTER — Other Ambulatory Visit (HOSPITAL_COMMUNITY): Payer: Self-pay | Admitting: *Deleted

## 2011-03-06 ENCOUNTER — Encounter (HOSPITAL_COMMUNITY)
Admission: RE | Admit: 2011-03-06 | Discharge: 2011-03-06 | Disposition: A | Discharge status: Home / Self Care | Payer: Medicaid Other | Source: Ambulatory Visit | Attending: Nephrology | Admitting: Nephrology

## 2011-03-06 DIAGNOSIS — D509 Iron deficiency anemia, unspecified: Secondary | ICD-10-CM | POA: Insufficient documentation

## 2011-03-06 MED ORDER — FERUMOXYTOL INJECTION 510 MG/17 ML
510.0000 mg | INTRAVENOUS | Status: DC
  Administered 2011-03-06: 510 mg via INTRAVENOUS

## 2011-03-06 MED ORDER — FERUMOXYTOL INJECTION 510 MG/17 ML
INTRAVENOUS | Status: AC
  Administered 2011-03-06: 510 mg via INTRAVENOUS
  Filled 2011-03-06: qty 17

## 2011-03-16 ENCOUNTER — Encounter (HOSPITAL_COMMUNITY)
Admission: RE | Admit: 2011-03-16 | Discharge: 2011-03-16 | Disposition: A | Discharge status: Home / Self Care | Payer: Medicaid Other | Source: Ambulatory Visit | Attending: Nephrology | Admitting: Nephrology

## 2011-03-16 MED ORDER — FERUMOXYTOL INJECTION 510 MG/17 ML
510.0000 mg | Freq: Once | INTRAVENOUS | Status: AC
  Administered 2011-03-16: 510 mg via INTRAVENOUS

## 2011-03-16 MED ORDER — FERUMOXYTOL INJECTION 510 MG/17 ML
INTRAVENOUS | Status: AC
  Filled 2011-03-16: qty 17

## 2011-03-17 ENCOUNTER — Emergency Department (INDEPENDENT_AMBULATORY_CARE_PROVIDER_SITE_OTHER): Payer: Medicaid Other

## 2011-03-17 ENCOUNTER — Other Ambulatory Visit: Payer: Self-pay

## 2011-03-17 ENCOUNTER — Encounter (HOSPITAL_COMMUNITY): Payer: Self-pay | Admitting: *Deleted

## 2011-03-17 ENCOUNTER — Emergency Department (HOSPITAL_COMMUNITY)
Admission: EM | Admit: 2011-03-17 | Discharge: 2011-03-17 | Disposition: A | Discharge status: Home / Self Care | Payer: Medicaid Other | Attending: Emergency Medicine | Admitting: Emergency Medicine

## 2011-03-17 ENCOUNTER — Emergency Department (HOSPITAL_COMMUNITY)
Admission: EM | Admit: 2011-03-17 | Discharge: 2011-03-17 | Disposition: A | Discharge status: Nursing Home (Includes ICF) | Payer: Medicaid Other | Source: Home / Self Care | Attending: Emergency Medicine | Admitting: Emergency Medicine

## 2011-03-17 DIAGNOSIS — Z79899 Other long term (current) drug therapy: Secondary | ICD-10-CM | POA: Insufficient documentation

## 2011-03-17 DIAGNOSIS — M549 Dorsalgia, unspecified: Secondary | ICD-10-CM | POA: Insufficient documentation

## 2011-03-17 DIAGNOSIS — M329 Systemic lupus erythematosus, unspecified: Secondary | ICD-10-CM

## 2011-03-17 DIAGNOSIS — R918 Other nonspecific abnormal finding of lung field: Secondary | ICD-10-CM

## 2011-03-17 DIAGNOSIS — M25519 Pain in unspecified shoulder: Secondary | ICD-10-CM | POA: Insufficient documentation

## 2011-03-17 DIAGNOSIS — I517 Cardiomegaly: Secondary | ICD-10-CM

## 2011-03-17 DIAGNOSIS — R296 Repeated falls: Secondary | ICD-10-CM | POA: Insufficient documentation

## 2011-03-17 DIAGNOSIS — R071 Chest pain on breathing: Secondary | ICD-10-CM | POA: Insufficient documentation

## 2011-03-17 DIAGNOSIS — R0789 Other chest pain: Secondary | ICD-10-CM

## 2011-03-17 LAB — CBC
HCT: 29.8 % — ABNORMAL LOW (ref 36.0–46.0)
Hemoglobin: 9.4 g/dL — ABNORMAL LOW (ref 12.0–15.0)
MCV: 86.6 fL (ref 78.0–100.0)
RBC: 3.44 MIL/uL — ABNORMAL LOW (ref 3.87–5.11)
WBC: 4.7 10*3/uL (ref 4.0–10.5)

## 2011-03-17 LAB — BASIC METABOLIC PANEL
BUN: 8 mg/dL (ref 6–23)
CO2: 30 mEq/L (ref 19–32)
Chloride: 106 mEq/L (ref 96–112)
Creatinine, Ser: 0.71 mg/dL (ref 0.50–1.10)

## 2011-03-17 MED ORDER — ACETAMINOPHEN 325 MG PO TABS
975.0000 mg | ORAL_TABLET | Freq: Once | ORAL | Status: AC
  Administered 2011-03-17: 975 mg via ORAL
  Filled 2011-03-17: qty 3

## 2011-03-17 MED ORDER — NITROGLYCERIN 0.4 MG SL SUBL: 0.4000 mg | SUBLINGUAL_TABLET | SUBLINGUAL | Status: DC | PRN

## 2011-03-17 MED ORDER — POTASSIUM CHLORIDE CRYS ER 20 MEQ PO TBCR
40.0000 meq | EXTENDED_RELEASE_TABLET | Freq: Once | ORAL | Status: AC
  Administered 2011-03-17: 40 meq via ORAL
  Filled 2011-03-17: qty 2

## 2011-03-17 MED ORDER — ASPIRIN 325 MG PO TABS: 325.0000 mg | ORAL_TABLET | ORAL | Status: DC

## 2011-03-17 NOTE — ED Notes (Signed)
Pt reports she fell in an elevator 03/13/2011.  She has had pain in her chest and back since then.  She took tylenol for pain.  Today she c/o pain mid chest/ mid-right back and right side of her neck.  The pain is worse with movement and when she bends down

## 2011-03-17 NOTE — ED Provider Notes (Signed)
Chief Complaint  Patient presents with  . Back Pain    History of Present Illness:  The patient is a 36 year old female with a history of lupus who fell out of an elevator 5 days ago, landing flat on her back. This happened at a hotel in [place], [place] and there was no loss of consciousness, but ever since then she's had pain in her right hemithorax, right lower sternal area, right mid back, she's had a right occipital headache and right trapezius ridge pain. The neck pain is worse with movement of the neck, also with bending and carrying. She denies any pain with deep inspiration, coughing, sputum production, hemoptysis, pleuritic pain, shortness of breath, fever, or chills. She has been off of all her lupus medication for the past 2 months, since she could not see her oncologist in [place] do a car problem.  Review of Systems:  Other than noted above, the patient denies any of the following symptoms. Systemic:  No fever, chills, sweats, or fatigue. ENT:  No nasal congestion, rhinorrhea, or sore throat. Pulmonary:  No cough, wheezing, shortness of breath, sputum production, hemoptysis. Cardiac:  No palpitations, rapid heartbeat, dizziness, presyncope or syncope. GI:  No abdominal pain, heartburn, nausea, or vomiting. Skin:  No rash or itching. Ext:  No leg pain or swelling.   PMFSH:  Past medical history, family history, social history, meds, and allergies were reviewed and updated as needed.  Physical Exam:   Vital signs:  BP 160/90  Pulse 85  Temp(Src) 98.1 F (36.7 C) (Oral)  Resp 16  SpO2 100%  LMP 03/09/2011 Gen:  Alert, oriented, in no distress, skin warm and dry. Eye:  PERRL, lids and conjunctivas normal.  Sclera non-icteric. ENT:  Mucous membranes moist, pharynx clear. Neck:  Supple, no adenopathy or tenderness.  No JVD. Lungs:  Clear to auscultation, no wheezes, rales or rhonchi.  No respiratory distress. Heart:  Regular rhythm.  No gallops, murmers, clicks or  rubs. Chest:  No chest wall tenderness. Abdomen:  Soft, nontender, no organomegaly or mass.  Bowel sounds normal.  No pulsatile abdominal mass or bruit. Ext:  No edema.  No calf tenderness and Homann's sign negative.  Pulses full and equal. Skin:  Warm and dry.  No rash.  Labs:   Results for orders placed during the hospital encounter of 03/16/11  HEMOGLOBIN AND HEMATOCRIT, BLOOD      Component Value Range   Hemoglobin 8.8 (*) 12.0 - 15.0 (g/dL)   HCT 16.1 (*) 09.6 - 46.0 (%)     Radiology:  Dg Chest 2 View  03/17/2011  *RADIOLOGY REPORT*  Clinical Data: 36 year old female with fall.  Back and chest pain.  CHEST - 2 VIEW  Comparison: 03/30/2005.  Findings: Patchy right greater than left lower lobe airspace opacity.  Mild cardiomegaly appears somewhat increased. Other mediastinal contours are within normal limits.   No pneumothorax, pulmonary edema or pleural effusion. No acute osseous abnormality identified.  IMPRESSION: 1.  Nonspecific right greater than left patchy lower lobe opacity. 2.  Cardiomegaly with progression since 2007.  Original Report Authenticated By: Harley Hallmark, M.D.   Assessment:   Diagnoses that have been ruled out:  None  Diagnoses that are still under consideration:  None  Final diagnoses:  Pulmonary infiltrate  this could be due to pneumonia, pulmonary emboli, lung contusion, or secondary to the lupus itself.   Lupus  she has been off of all of her lupus medication for about 2 months, and is  a good chance that this has flared up again.     Plan:   1.  She'll be sent to the emergency room via shuttle.  Roque Lias, MD 03/17/11 [phone]

## 2011-03-17 NOTE — ED Notes (Signed)
Pt resting quietly at this time with no complaints voiced 

## 2011-03-17 NOTE — ED Provider Notes (Signed)
History     CSN: [id number]  Arrival date & time 03/17/11  1632   First MD Initiated Contact with Patient 03/17/11 2040      Chief Complaint  Patient presents with  . Chest Pain    (Consider location/radiation/quality/duration/timing/severity/associated sxs/prior treatment) HPI Patient fell 5 days ago out of an elevator from a ground-level landing on her back since the event complains of right-sided back pain at level of scapula radiating to chest worse with movement or twisting or changing position no cough no fever no shortness of breath treated with Tylenol partial relief. Seen at [facility] earlier today sent here for further evaluation. No other associatedsymptoms. Past Medical History  Diagnosis Date  . Lupus erythematosus     History reviewed. No pertinent past surgical history.  Family History  Problem Relation Age of Onset  . Diabetes Father   . Diabetes Other   . Cancer Other   . Hypertension Other     History  Substance Use Topics  . Smoking status: Never Smoker   . Smokeless tobacco: Not on file  . Alcohol Use: No    OB History    Grav Para Term Preterm Abortions TAB SAB Ect Mult Living                  Review of Systems  Constitutional: Negative.   HENT: Negative.   Respiratory: Negative.   Cardiovascular: Positive for chest pain.  Gastrointestinal: Negative.   Musculoskeletal: Positive for back pain.  Skin: Negative.   Neurological: Negative.   Hematological: Negative.   Psychiatric/Behavioral: Negative.     Allergies  Sulfa antibiotics and Sulfonamide derivatives  Home Medications   Current Outpatient Rx  Name Route Sig Dispense Refill  . ACETAMINOPHEN ER 650 MG PO TBCR Oral Take 650 mg by mouth every 8 (eight) hours as needed. For pain    . HYDROXYCHLOROQUINE SULFATE 200 MG PO TABS Oral Take 400 mg by mouth daily.    Marland Kitchen MYCOPHENOLATE MOFETIL 500 MG PO TABS Oral Take 1,500 mg by mouth 2 (two) times daily.    Marland Kitchen  PREDNISONE 10 MG PO TABS Oral Take 10 mg by mouth daily.      BP 170/104  Pulse 69  Temp(Src) 98.2 F (36.8 C) (Oral)  Resp 18  SpO2 100%  LMP 03/09/2011  Physical Exam  Nursing note and vitals reviewed. Constitutional: She appears well-developed and well-nourished. No distress.  HENT:  Head: Normocephalic and atraumatic.  Eyes: Conjunctivae are normal. Pupils are equal, round, and reactive to light.  Neck: Neck supple. No tracheal deviation present. No thyromegaly present.  Cardiovascular: Normal rate and regular rhythm.   No murmur heard. Pulmonary/Chest: Effort normal and breath sounds normal. She exhibits tenderness.       Mildly tender over right posterior thorax and right anterior chest no crepitance  Abdominal: Soft. Bowel sounds are normal. She exhibits no distension. There is no tenderness.  Musculoskeletal: Normal range of motion. She exhibits no edema and no tenderness.  Neurological: She is alert. Coordination normal.  Skin: Skin is warm and dry. No rash noted.  Psychiatric: She has a normal mood and affect.    ED Course  Procedures (including critical care time)  Labs Reviewed  CBC - Abnormal; Notable for the following:    RBC 3.44 (*)    Hemoglobin 9.4 (*)    HCT 29.8 (*)    RDW 18.5 (*)    All other components within normal limits  BASIC METABOLIC PANEL - Abnormal; Notable for the following:    Potassium 3.2 (*)    All other components within normal limits  PRO B NATRIURETIC PEPTIDE - Abnormal; Notable for the following:    Pro B Natriuretic peptide (BNP) 351.1 (*)    All other components within normal limits  POCT I-STAT TROPONIN I   Dg Chest 2 View  03/17/2011  *RADIOLOGY REPORT*  Clinical Data: 36 year old female with fall.  Back and chest pain.  CHEST - 2 VIEW  Comparison: 03/30/2005.  Findings: Patchy right greater than left lower lobe airspace opacity.  Mild cardiomegaly appears somewhat increased. Other mediastinal contours are within normal  limits.   No pneumothorax, pulmonary edema or pleural effusion. No acute osseous abnormality identified.  IMPRESSION: 1.  Nonspecific right greater than left patchy lower lobe opacity. 2.  Cardiomegaly with progression since 2007.  Original Report Authenticated By: Harley Hallmark, M.D.     No diagnosis found.  Date: 03/17/2011  Rate: 70  Rhythm: normal sinus rhythm  QRS Axis: normal  Intervals: normal  ST/T Wave abnormalities: nonspecific T wave changes  Conduction Disutrbances:none  Narrative Interpretation:   Old EKG Reviewed: changes noted Nonspecific ST-T wave changes new from 03/27/2005   Results for orders placed during the hospital encounter of 03/17/11  CBC      Component Value Range   WBC 4.7  4.0 - 10.5 (K/uL)   RBC 3.44 (*) 3.87 - 5.11 (MIL/uL)   Hemoglobin 9.4 (*) 12.0 - 15.0 (g/dL)   HCT 40.9 (*) 81.1 - 46.0 (%)   MCV 86.6  78.0 - 100.0 (fL)   MCH 27.3  26.0 - 34.0 (pg)   MCHC 31.5  30.0 - 36.0 (g/dL)   RDW 91.4 (*) 78.2 - 15.5 (%)   Platelets 230  150 - 400 (K/uL)  BASIC METABOLIC PANEL      Component Value Range   Sodium 141  135 - 145 (mEq/L)   Potassium 3.2 (*) 3.5 - 5.1 (mEq/L)   Chloride 106  96 - 112 (mEq/L)   CO2 30  19 - 32 (mEq/L)   Glucose, Bld 84  70 - 99 (mg/dL)   BUN 8  6 - 23 (mg/dL)   Creatinine, Ser 9.56  0.50 - 1.10 (mg/dL)   Calcium 8.9  8.4 - 21.3 (mg/dL)   GFR calc non Af Amer >90  >90 (mL/min)   GFR calc Af Amer >90  >90 (mL/min)  PRO B NATRIURETIC PEPTIDE      Component Value Range   Pro B Natriuretic peptide (BNP) 351.1 (*) 0 - 125 (pg/mL)  POCT I-STAT TROPONIN I      Component Value Range   Troponin i, poc 0.00  0.00 - 0.08 (ng/mL)   Comment 3            Dg Chest 2 View  03/17/2011  *RADIOLOGY REPORT*  Clinical Data: 36 year old female with fall.  Back and chest pain.  CHEST - 2 VIEW  Comparison: 03/30/2005.  Findings: Patchy right greater than left lower lobe airspace opacity.  Mild cardiomegaly appears somewhat increased. Other  mediastinal contours are within normal limits.   No pneumothorax, pulmonary edema or pleural effusion. No acute osseous abnormality identified.  IMPRESSION: 1.  Nonspecific right greater than left patchy lower lobe opacity. 2.  Cardiomegaly with progression since 2007.  Original Report Authenticated By: Harley Hallmark, M.D.     MDM  Strongly doubt pulmonary contusion., Pneumonia pulmonary embolism or serious lung or  heart pathology. Notice a congestive heart failure. Clinically patient appears well denies shortness of breath. Requesting only Tylenol for pain Symptoms likely consistent with contusion to chest wall Plan blood pressure recheck 1-2 weeks, Tylenol for pain Severe potassium recheck Anemia is chronic Diagnosis #1 chest wall pain #2 anemia #3 hypokalemia #4 elevated blood pressure        Doug Sou, MD 03/17/11 2123

## 2011-03-17 NOTE — ED Notes (Signed)
PT fell backwards out of elevator on Friday which was the onset of the chest pain. Pt's pain level is a 6 now. Pain feels like pressure and sharpness intermittenly. Pain is constant.

## 2011-03-17 NOTE — ED Notes (Signed)
Pt has had CP radiating to back since Friday.  Pt was seen at [facility] and was sent here for further evaluation.

## 2011-03-17 NOTE — Discharge Instructions (Signed)
Chest Pain (Nonspecific) It is often hard to give a specific diagnosis for the cause of chest pain. There is always a chance that your pain could be related to something serious, such as a heart attack or a blood clot in the lungs. You need to follow up with your caregiver for further evaluation. CAUSES   Heartburn.   Pneumonia or bronchitis.   Anxiety and stress.   Inflammation around your heart (pericarditis) or lung (pleuritis or pleurisy).   A blood clot in the lung.   A collapsed lung (pneumothorax). It can develop suddenly on its own (spontaneous pneumothorax) or from injury (trauma) to the chest.  The chest wall is composed of bones, muscles, and cartilage. Any of these can be the source of the pain.  The bones can be bruised by injury.   The muscles or cartilage can be strained by coughing or overwork.   The cartilage can be affected by inflammation and become sore (costochondritis).  DIAGNOSIS  Lab tests or other studies, such as X-rays, an EKG, stress testing, or cardiac imaging, may be needed to find the cause of your pain.  TREATMENT   Treatment depends on what may be causing your chest pain. Treatment may include:   Acid blockers for heartburn.   Anti-inflammatory medicine.   Pain medicine for inflammatory conditions.   Antibiotics if an infection is present.   You may be advised to change lifestyle habits. This includes stopping smoking and avoiding caffeine and chocolate.   You may be advised to keep your head raised (elevated) when sleeping. This reduces the chance of acid going backward from your stomach into your esophagus.   Most of the time, nonspecific chest pain will improve within 2 to 3 days with rest and mild pain medicine.  HOME CARE INSTRUCTIONS   If antibiotics were prescribed, take the full amount even if you start to feel better.   For the next few days, avoid physical activities that bring on chest pain. Continue physical activities as  directed.   Do not smoke cigarettes or drink alcohol until your symptoms are gone.   Only take over-the-counter or prescription medicine for pain, discomfort, or fever as directed by your caregiver.   Follow your caregiver's suggestions for further testing if your chest pain does not go away.   Keep any follow-up appointments you made. If you do not go to an appointment, you could develop lasting (chronic) problems with pain. If there is any problem keeping an appointment, you must call to reschedule.  SEEK MEDICAL CARE IF:   You think you are having problems from the medicine you are taking. Read your medicine instructions carefully.   Your chest pain does not go away, even after treatment.   You develop a rash with blisters on your chest.  SEEK IMMEDIATE MEDICAL CARE IF:   You have increased chest pain or pain that spreads to your arm, neck, jaw, back, or belly (abdomen).   You develop shortness of breath, an increasing cough, or you are coughing up blood.   You have severe back or abdominal pain, feel sick to your stomach (nauseous) or throw up (vomit).   You develop severe weakness, fainting, or chills.   You have an oral temperature above 102 F (38.9 C), not controlled by medicine.  THIS IS AN EMERGENCY. Do not wait to see if the pain will go away. Get medical help at once. Call your local emergency services (911 in [place] Do not drive yourself to   the hospital. MAKE SURE YOU:   Understand these instructions.   Will watch your condition.   Will get help right away if you are not doing well or get worse.  Document Released: 10/29/2004 Document Revised: 10/01/2010 Document Reviewed: 08/25/2007 [facility] Patient Information 2012 [place], [place].   Get your blood pressure and blood potassium level rechecked within the next week. Take Tylenol for pain. You can go to your lupus doctor or get rechecked at the [facility]

## 2011-03-18 ENCOUNTER — Telehealth (HOSPITAL_COMMUNITY): Payer: Self-pay | Admitting: *Deleted

## 2011-03-18 NOTE — ED Notes (Signed)
1257  Pt. called and requested I fax her xray result to Dr. Carolan Clines @ [phone].  Result faxed and confirmation received. Christine Todd 03/18/2011

## 2011-06-01 ENCOUNTER — Other Ambulatory Visit (HOSPITAL_COMMUNITY): Payer: Self-pay | Admitting: Nephrology

## 2011-06-01 DIAGNOSIS — R809 Proteinuria, unspecified: Secondary | ICD-10-CM

## 2011-06-08 ENCOUNTER — Other Ambulatory Visit: Payer: Self-pay | Admitting: Physician Assistant

## 2011-06-09 ENCOUNTER — Other Ambulatory Visit: Payer: Self-pay | Admitting: Radiology

## 2011-06-11 ENCOUNTER — Ambulatory Visit (HOSPITAL_COMMUNITY)
Admission: RE | Admit: 2011-06-11 | Discharge: 2011-06-11 | Disposition: A | Discharge status: Home / Self Care | Payer: Medicaid Other | Source: Ambulatory Visit | Attending: Nephrology | Admitting: Nephrology

## 2011-06-11 ENCOUNTER — Encounter (HOSPITAL_COMMUNITY): Payer: Self-pay

## 2011-06-11 DIAGNOSIS — R809 Proteinuria, unspecified: Secondary | ICD-10-CM

## 2011-06-11 DIAGNOSIS — M329 Systemic lupus erythematosus, unspecified: Secondary | ICD-10-CM | POA: Insufficient documentation

## 2011-06-11 DIAGNOSIS — F1910 Other psychoactive substance abuse, uncomplicated: Secondary | ICD-10-CM | POA: Insufficient documentation

## 2011-06-11 LAB — CBC
MCH: 29.2 pg (ref 26.0–34.0)
MCHC: 32.6 g/dL (ref 30.0–36.0)
Platelets: 187 10*3/uL (ref 150–400)
RBC: 4.39 MIL/uL (ref 3.87–5.11)

## 2011-06-11 LAB — PROTIME-INR: Prothrombin Time: 13.1 seconds (ref 11.6–15.2)

## 2011-06-11 LAB — APTT: aPTT: 35 seconds (ref 24–37)

## 2011-06-11 MED ORDER — FENTANYL CITRATE 0.05 MG/ML IJ SOLN
INTRAMUSCULAR | Status: AC
  Filled 2011-06-11: qty 4

## 2011-06-11 MED ORDER — SODIUM CHLORIDE 0.9 % IV SOLN
INTRAVENOUS | Status: AC | PRN
  Administered 2011-06-11: 75 mL/h via INTRAVENOUS

## 2011-06-11 MED ORDER — MIDAZOLAM HCL 2 MG/2ML IJ SOLN
INTRAMUSCULAR | Status: AC
  Filled 2011-06-11: qty 4

## 2011-06-11 MED ORDER — SODIUM CHLORIDE 0.9 % IV SOLN
INTRAVENOUS | Status: DC
  Administered 2011-06-11: 14:00:00 via INTRAVENOUS

## 2011-06-11 MED ORDER — HYDROCODONE-ACETAMINOPHEN 5-325 MG PO TABS: 1.0000 | ORAL_TABLET | Freq: Four times a day (QID) | ORAL | Status: DC | PRN

## 2011-06-11 MED ORDER — FENTANYL CITRATE 0.05 MG/ML IJ SOLN
INTRAMUSCULAR | Status: AC | PRN
  Administered 2011-06-11: 50 ug via INTRAVENOUS
  Administered 2011-06-11 (×2): 25 ug via INTRAVENOUS

## 2011-06-11 MED ORDER — MIDAZOLAM HCL 5 MG/5ML IJ SOLN
INTRAMUSCULAR | Status: AC | PRN
  Administered 2011-06-11 (×2): 1 mg via INTRAVENOUS

## 2011-06-11 NOTE — ED Notes (Signed)
O2 d/c'd 

## 2011-06-11 NOTE — Progress Notes (Signed)
Assumed pt's care from [facility]N.

## 2011-06-11 NOTE — Discharge Instructions (Signed)
Kidney Biopsy  A biopsy is a test that involves collecting small pieces of tissue, usually through a needle. The tissue is then examined under a microscope. A kidney biopsy can help find a diagnosis and determine the best course of treatment. Your caregiver may recommend a kidney biopsy if you have any of the following conditions:   Hematuria. This is blood in your urine.   Proteinuria. This is when there is excessive protein in your urine.   Impaired kidney function that causes excessive waste products in your blood.  A specialist will look at the kidney tissue samples to check for unusual deposits, scarring, or infecting organisms that would explain your condition. Your caregiver may discover that you have a condition that can be treated and cured. If you have progressive kidney failure, the biopsy may show how quickly the disease is advancing. A biopsy can also help explain why a transplanted kidney is not working properly. Talk with your caregiver about what information might be learned from the biopsy and the risks involved. This can help you make a decision about whether a biopsy is worthwhile in your case.  BEFORE THE PROCEDURE   Make sure you understand the need for a biopsy.   Tell your caregiver about any allergies you have and medicines you take.   Avoid food and fluid for 8 hours before the test.   You will have to sign a consent form indicating that you understand the risks involved in this procedure. They are very rare. Discuss these risks thoroughly with your caregiver before you sign the form.   Make sure your caregiver is aware of all the medicines you take and any drug allergies you might have. Shortly before the biopsy, you will give blood and urine samples. This is to make sure you do not have a condition that would suggest not doing a biopsy.  PROCEDURE   Kidney biopsies are usually done in a hospital. You may be fully awake with light sedation or you may be asleep under general  anesthesia. If you are awake, you will be given a local anesthetic before the needle is inserted.   You will lie on your stomach to position the kidneys near the surface of your back. If you have a transplanted kidney, you will lie on your back. The doctor will inject a local painkiller. For a through the skin (percutaneous) biopsy, the doctor will use a locating needle and X-ray or ultrasound equipment to find the right spot and then a collecting needle to gather the tissue. You will be asked to hold your breath as the doctor inserts the biopsy needle and collects the tissue. This is usually for about 30 seconds or a little longer for each insertion. Do not exhale until you are told.   The entire procedure usually takes an hour. This includes time to locate the kidney, clean the biopsy site, inject the local painkiller, and obtain the tissue samples.   Some patients should not have a percutaneous biopsy if they are prone to bleeding problems. These patients may still undergo a kidney biopsy through an open operation. This is when the surgeon makes an incision and can see the kidney to obtain a biopsy.  AFTER THE PROCEDURE   You will lie on your back for 12 to 24 hours. During this time, your back will probably feel sore. If you have a transplanted kidney, you will lie on your stomach. You may stay in the hospital overnight after the procedure so   that staff can check your condition. You may notice some blood in your urine for 24 hours after the test. To detect any problems, your caregivers will:   Monitor your blood pressure and pulse.   Take blood samples to measure the amount of red cells.   Examine the urine that you pass.   On rare occasions when bleeding does not stop on its own, it may be necessary to replace lost blood with a transfusion.   A rare complication is infection from the biopsy procedure.  SEEK MEDICAL CARE IF:   You have bloody urine more than 24 hours after the test.   You have a  fever.   You feel faint or dizzy .   You cannot urinate .   You have worsening pain in the biopsy.  OBTAINING THE TEST RESULTS  It is your responsibility to obtain your test results. Ask the lab or department performing the test when and how you will get your results.  FOR MORE INFORMATION   [employer]: www.akfinc.org   National Kidney Foundation: www.kidney.org   National Kidney and urologic Diseases Information Clearinghouse: http://kidney.niddk.nih.gov/  Document Released: 11/30/2003 Document Revised: 01/08/2011 Document Reviewed: 01/06/2010  [employer] Patient Information 2012 [employer].

## 2011-06-11 NOTE — H&P (Signed)
Christine Todd is an 36 y.o. female.   Chief Complaint: Lupus; proteinuria x 3-4 yrs- worsening Scheduled for random renal biopsy in IR HPI: here for biopsy  Past Medical History  Diagnosis Date  . Lupus erythematosus     No past surgical history on file.  Family History  Problem Relation Age of Onset  . Diabetes Father   . Diabetes Other   . Cancer Other   . Hypertension Other    Social History:  reports that she has never smoked. She does not have any smokeless tobacco history on file. She reports that she uses illicit drugs. She reports that she does not drink alcohol.  Allergies:  Allergies  Allergen Reactions  . Sulfa Antibiotics   . Sulfonamide Derivatives     REACTION: rash, itching     (Not in a hospital admission)  Results for orders placed during the hospital encounter of 06/11/11 (from the past 48 hour(s))  APTT     Status: Normal   Collection Time   06/11/11  8:22 AM      Component Value Range Comment   aPTT 35  24 - 37 (seconds)   CBC     Status: Normal   Collection Time   06/11/11  8:22 AM      Component Value Range Comment   WBC 5.6  4.0 - 10.5 (K/uL)    RBC 4.39  3.87 - 5.11 (MIL/uL)    Hemoglobin 12.8  12.0 - 15.0 (g/dL)    HCT 40.9  81.1 - 91.4 (%)    MCV 89.5  78.0 - 100.0 (fL)    MCH 29.2  26.0 - 34.0 (pg)    MCHC 32.6  30.0 - 36.0 (g/dL)    RDW 78.2  95.6 - 21.3 (%)    Platelets 187  150 - 400 (K/uL)   PROTIME-INR     Status: Normal   Collection Time   06/11/11  8:22 AM      Component Value Range Comment   Prothrombin Time 13.1  11.6 - 15.2 (seconds)    INR 0.97  0.00 - 1.49     No results found.  Review of Systems  Constitutional: Negative for fever.  Gastrointestinal: Negative for nausea and vomiting.  Genitourinary: Positive for hematuria.       Occasionally  Neurological: Negative for headaches.    Blood pressure 146/93, pulse 74, temperature 97.5 F (36.4 C), temperature source Oral, resp. rate 18, height 5' 7.5" (1.715 m), weight  172 lb (78.019 kg), last menstrual period 05/31/2011, SpO2 100.00%. Physical Exam  Constitutional: She is oriented to person, place, and time. She appears well-developed.  Cardiovascular: Normal rate, regular rhythm and normal heart sounds.   No murmur heard. Respiratory: Effort normal and breath sounds normal. She has no wheezes.  GI: Soft. Bowel sounds are normal. There is no tenderness.  Musculoskeletal: Normal range of motion.  Neurological: She is alert and oriented to person, place, and time.  Skin: Skin is warm and dry.  Psychiatric: She has a normal mood and affect. Her behavior is normal. Judgment and thought content normal.     Assessment/Plan Lupus; worsening proteinuria Scheduled now for random renal biopsy Pt aware of procedure benefits and risks and agreeable to proceed. Consent signed.  Waverly Tarquinio A 06/11/2011, 10:03 AM

## 2011-06-11 NOTE — ED Notes (Signed)
O2 2l [place] applied

## 2011-06-11 NOTE — Procedures (Signed)
US-guided right renal core biopsy, 2 cores.  No immediate complication.

## 2013-04-17 ENCOUNTER — Emergency Department (HOSPITAL_COMMUNITY)
Admission: EM | Admit: 2013-04-17 | Discharge: 2013-04-17 | Disposition: A | Discharge status: Home / Self Care | Payer: Medicaid Other | Attending: Emergency Medicine | Admitting: Emergency Medicine

## 2013-04-17 ENCOUNTER — Encounter (HOSPITAL_COMMUNITY): Payer: Self-pay | Admitting: Emergency Medicine

## 2013-04-17 DIAGNOSIS — Y929 Unspecified place or not applicable: Secondary | ICD-10-CM | POA: Insufficient documentation

## 2013-04-17 DIAGNOSIS — M25572 Pain in left ankle and joints of left foot: Secondary | ICD-10-CM

## 2013-04-17 DIAGNOSIS — IMO0002 Reserved for concepts with insufficient information to code with codable children: Secondary | ICD-10-CM | POA: Insufficient documentation

## 2013-04-17 DIAGNOSIS — Y9389 Activity, other specified: Secondary | ICD-10-CM | POA: Insufficient documentation

## 2013-04-17 DIAGNOSIS — S99919A Unspecified injury of unspecified ankle, initial encounter: Principal | ICD-10-CM

## 2013-04-17 DIAGNOSIS — L930 Discoid lupus erythematosus: Secondary | ICD-10-CM | POA: Insufficient documentation

## 2013-04-17 DIAGNOSIS — S8990XA Unspecified injury of unspecified lower leg, initial encounter: Secondary | ICD-10-CM | POA: Insufficient documentation

## 2013-04-17 DIAGNOSIS — X500XXA Overexertion from strenuous movement or load, initial encounter: Secondary | ICD-10-CM | POA: Insufficient documentation

## 2013-04-17 DIAGNOSIS — S99929A Unspecified injury of unspecified foot, initial encounter: Principal | ICD-10-CM

## 2013-04-17 DIAGNOSIS — Z79899 Other long term (current) drug therapy: Secondary | ICD-10-CM | POA: Insufficient documentation

## 2013-04-17 MED ORDER — HYDROCODONE-ACETAMINOPHEN 5-325 MG PO TABS: 1.0000 | ORAL_TABLET | ORAL | Status: DC | PRN

## 2013-04-17 NOTE — ED Provider Notes (Signed)
CSN: [id number]     Arrival date & time 04/17/13  0014 History   First MD Initiated Contact with Patient 04/17/13 0031     Chief Complaint  Patient presents with  . Foot Pain     (Consider location/radiation/quality/duration/timing/severity/associated sxs/prior Treatment) Patient is a 38 y.o. female presenting with lower extremity pain.  Foot Pain Pertinent negatives include no abdominal pain, chest pain, fever or numbness.   Christine Todd, 38 yo female, with a PMH of lupus, comes to the ED with complaints of left foot pain since Friday night. She states that she was sitting on the couch and she felt a "crick" and a pop in her left foot. She tried to work it out by moving her left foot but had no relief. She describes the constant pain to be on the medial side of the ankle with radiation across top and is throbbing in nature. She denies ambulating after the incident until the next morning. The next day, she noticed swelling and increased pain with movement of foot and ambulation. She states that she has had two previous episodes like this, but they resolved without complication. She has tried Tylenol with minimal relief. She denies any associated trauma to foot, medication changes, shortness of breath, chest pain or calf pain.  Past Medical History  Diagnosis Date  . Lupus erythematosus    History reviewed. No pertinent past surgical history. Family History  Problem Relation Age of Onset  . Diabetes Father   . Diabetes Other   . Cancer Other   . Hypertension Other    History  Substance Use Topics  . Smoking status: Never Smoker   . Smokeless tobacco: Not on file  . Alcohol Use: No   OB History   Grav Para Term Preterm Abortions TAB SAB Ect Mult Living                 Review of Systems  Constitutional: Negative for fever.  Respiratory: Negative for shortness of breath.   Cardiovascular: Negative for chest pain.  Gastrointestinal: Negative for abdominal pain.  Neurological:  Negative for dizziness, syncope and numbness.      Allergies  Sulfa antibiotics and Sulfonamide derivatives  Home Medications   Current Outpatient Rx  Name  Route  Sig  Dispense  Refill  . acetaminophen (TYLENOL) 650 MG CR tablet   Oral   Take 650 mg by mouth every 8 (eight) hours as needed. For pain         . atenolol (TENORMIN) 50 MG tablet   Oral   Take 50 mg by mouth daily.         . furosemide (LASIX) 20 MG tablet   Oral   Take 20 mg by mouth daily.         Marland Kitchen HYDROcodone-acetaminophen (NORCO/VICODIN) 5-325 MG per tablet   Oral   Take 1 tablet by mouth every 4 (four) hours as needed for severe pain.   12 tablet   0   . predniSONE (DELTASONE) 20 MG tablet   Oral   Take 40 mg by mouth daily.          BP 158/99  Pulse 97  Temp(Src) 99 F (37.2 C) (Oral)  Resp 20  SpO2 98%  LMP 03/29/2013 Physical Exam  Vitals reviewed. Constitutional: She is oriented to person, place, and time. She appears well-developed and well-nourished.  Cardiovascular: Normal rate, regular rhythm and intact distal pulses.   Pulmonary/Chest: Effort normal.  Abdominal: Bowel sounds are normal.  Musculoskeletal: She exhibits tenderness.  Patient is neurovascularly intact.  Pain on medial aspect of left ankle with radiation across top with inversion, eversion, dorsal flexion and plantar flexion. Limp and pain was noted on ambulation Negative Homans sign  No signs of obvious infection   Neurological: She is alert and oriented to person, place, and time.  Psychiatric: She has a normal mood and affect. Her behavior is normal.    ED Course  Procedures (including critical care time)  1:34 AM Pt with L ankle pain and L foot pain.  No specific injury.  No evidence to suggest infection.  Is neurovascularly intact.  No evidence to suggest DVT.  Able to ambulate.  Will provide ASO, pain meds and care instruction.  Return precaution discussed.     Labs Review Labs Reviewed - No data  to display Imaging Review No results found.   EKG Interpretation None      MDM   Final diagnoses:  Left ankle pain    BP 158/99  Pulse 97  Temp(Src) 99 F (37.2 C) (Oral)  Resp 20  SpO2 98%  LMP 03/29/2013  I have reviewed nursing notes and vital signs. I reviewed available ER/hospitalization records thought the EMR     Domenic Moras, [place] 04/17/13 7408

## 2013-04-17 NOTE — Discharge Instructions (Signed)
Return if you develop rash, increase swelling, calf pain, trouble breathing or if you have other concerns.   Ankle Pain Ankle pain is a common symptom. The bones, cartilage, tendons, and muscles of the ankle joint perform a lot of work each day. The ankle joint holds your body weight and allows you to move around. Ankle pain can occur on either side or back of 1 or both ankles. Ankle pain may be sharp and burning or dull and aching. There may be tenderness, stiffness, redness, or warmth around the ankle. The pain occurs more often when a person walks or puts pressure on the ankle. CAUSES  There are many reasons ankle pain can develop. It is important to work with your caregiver to identify the cause since many conditions can impact the bones, cartilage, muscles, and tendons. Causes for ankle pain include:  Injury, including a break (fracture), sprain, or strain often due to a fall, sports, or a high-impact activity.  Swelling (inflammation) of a tendon (tendonitis).  Achilles tendon rupture.  Ankle instability after repeated sprains and strains.  Poor foot alignment.  Pressure on a nerve (tarsal tunnel syndrome).  Arthritis in the ankle or the lining of the ankle.  Crystal formation in the ankle (gout or pseudogout). DIAGNOSIS  A diagnosis is based on your medical history, your symptoms, results of your physical exam, and results of diagnostic tests. Diagnostic tests may include X-ray exams or a computerized magnetic scan (magnetic resonance imaging, MRI). TREATMENT  Treatment will depend on the cause of your ankle pain and may include:  Keeping pressure off the ankle and limiting activities.  Using crutches or other walking support (a cane or brace).  Using rest, ice, compression, and elevation.  Participating in physical therapy or home exercises.  Wearing shoe inserts or special shoes.  Losing weight.  Taking medications to reduce pain or swelling or receiving an  injection.  Undergoing surgery. HOME CARE INSTRUCTIONS   Only take over-the-counter or prescription medicines for pain, discomfort, or fever as directed by your caregiver.  Put ice on the injured area.  Put ice in a plastic bag.  Place a towel between your skin and the bag.  Leave the ice on for 15-20 minutes at a time, 03-04 times a day.  Keep your leg raised (elevated) when possible to lessen swelling.  Avoid activities that cause ankle pain.  Follow specific exercises as directed by your caregiver.  Record how often you have ankle pain, the location of the pain, and what it feels like. This information may be helpful to you and your caregiver.  Ask your caregiver about returning to work or sports and whether you should drive.  Follow up with your caregiver for further examination, therapy, or testing as directed. SEEK MEDICAL CARE IF:   Pain or swelling continues or worsens beyond 1 week.  You have an oral temperature above 102 F (38.9 C).  You are feeling unwell or have chills.  You are having an increasingly difficult time with walking.  You have loss of sensation or other new symptoms.  You have questions or concerns. MAKE SURE YOU:   Understand these instructions.  Will watch your condition.  Will get help right away if you are not doing well or get worse. Document Released: 07/09/2009 Document Revised: 04/13/2011 Document Reviewed: 07/09/2009 [facility] Patient Information 2014 [place].

## 2013-04-17 NOTE — ED Notes (Signed)
Pt. Reports Friday she heard a "pop/crack" in left foot. States pain to medial side of foot with swelling starting today. Pt. Ankle/top of foot swollen. Denies numbness/tingling in foot, pedal pulse intact.

## 2013-04-20 NOTE — ED Provider Notes (Signed)
Medical screening examination/treatment/procedure(s) were performed by non-physician practitioner and as supervising physician I was immediately available for consultation/collaboration.   EKG Interpretation None       Varney Biles, MD 04/20/13 [phone]

## 2014-07-04 ENCOUNTER — Emergency Department (INDEPENDENT_AMBULATORY_CARE_PROVIDER_SITE_OTHER)
Admission: EM | Admit: 2014-07-04 | Discharge: 2014-07-04 | Disposition: A | Discharge status: Nursing Home (Includes ICF) | Payer: Self-pay | Source: Home / Self Care | Attending: Family Medicine | Admitting: Family Medicine

## 2014-07-04 ENCOUNTER — Emergency Department (HOSPITAL_COMMUNITY): Payer: BLUE CROSS/BLUE SHIELD

## 2014-07-04 ENCOUNTER — Encounter (HOSPITAL_COMMUNITY): Payer: Self-pay | Admitting: *Deleted

## 2014-07-04 ENCOUNTER — Encounter (HOSPITAL_COMMUNITY): Payer: Self-pay | Admitting: Emergency Medicine

## 2014-07-04 ENCOUNTER — Observation Stay (HOSPITAL_COMMUNITY)
Admission: EM | Admit: 2014-07-04 | Discharge: 2014-07-05 | Disposition: A | Discharge status: Home / Self Care | Payer: BLUE CROSS/BLUE SHIELD | Attending: Internal Medicine | Admitting: Internal Medicine

## 2014-07-04 DIAGNOSIS — D649 Anemia, unspecified: Secondary | ICD-10-CM | POA: Diagnosis present

## 2014-07-04 DIAGNOSIS — L930 Discoid lupus erythematosus: Secondary | ICD-10-CM | POA: Diagnosis not present

## 2014-07-04 DIAGNOSIS — I10 Essential (primary) hypertension: Secondary | ICD-10-CM | POA: Diagnosis present

## 2014-07-04 DIAGNOSIS — E876 Hypokalemia: Secondary | ICD-10-CM | POA: Diagnosis present

## 2014-07-04 DIAGNOSIS — R42 Dizziness and giddiness: Secondary | ICD-10-CM | POA: Insufficient documentation

## 2014-07-04 DIAGNOSIS — R55 Syncope and collapse: Secondary | ICD-10-CM

## 2014-07-04 DIAGNOSIS — R0789 Other chest pain: Secondary | ICD-10-CM | POA: Insufficient documentation

## 2014-07-04 DIAGNOSIS — M329 Systemic lupus erythematosus, unspecified: Secondary | ICD-10-CM | POA: Diagnosis present

## 2014-07-04 DIAGNOSIS — Z7952 Long term (current) use of systemic steroids: Secondary | ICD-10-CM | POA: Diagnosis not present

## 2014-07-04 DIAGNOSIS — Z882 Allergy status to sulfonamides status: Secondary | ICD-10-CM | POA: Diagnosis not present

## 2014-07-04 DIAGNOSIS — R079 Chest pain, unspecified: Secondary | ICD-10-CM

## 2014-07-04 DIAGNOSIS — Z9289 Personal history of other medical treatment: Secondary | ICD-10-CM

## 2014-07-04 DIAGNOSIS — K219 Gastro-esophageal reflux disease without esophagitis: Secondary | ICD-10-CM | POA: Diagnosis present

## 2014-07-04 DIAGNOSIS — D509 Iron deficiency anemia, unspecified: Principal | ICD-10-CM | POA: Diagnosis present

## 2014-07-04 DIAGNOSIS — R531 Weakness: Secondary | ICD-10-CM | POA: Diagnosis not present

## 2014-07-04 HISTORY — DX: Gastro-esophageal reflux disease without esophagitis: K21.9

## 2014-07-04 HISTORY — DX: Nonspecific reaction to tuberculin skin test without active tuberculosis: R76.11

## 2014-07-04 HISTORY — DX: Headache, unspecified: R51.9

## 2014-07-04 HISTORY — DX: Unspecified osteoarthritis, unspecified site: M19.90

## 2014-07-04 HISTORY — DX: Systemic lupus erythematosus, unspecified: M32.9

## 2014-07-04 HISTORY — DX: Pneumonia, unspecified organism: J18.9

## 2014-07-04 HISTORY — DX: Personal history of other medical treatment: Z92.89

## 2014-07-04 HISTORY — DX: Anemia, unspecified: D64.9

## 2014-07-04 HISTORY — DX: Headache: R51

## 2014-07-04 LAB — HEPATIC FUNCTION PANEL
ALT: 12 U/L — ABNORMAL LOW (ref 14–54)
AST: 25 U/L (ref 15–41)
Albumin: 3.1 g/dL — ABNORMAL LOW (ref 3.5–5.0)
Alkaline Phosphatase: 69 U/L (ref 38–126)
TOTAL PROTEIN: 8.2 g/dL — AB (ref 6.5–8.1)
Total Bilirubin: 0.4 mg/dL (ref 0.3–1.2)

## 2014-07-04 LAB — CBC WITH DIFFERENTIAL/PLATELET
BASOS PCT: 0 % (ref 0–1)
Basophils Absolute: 0 10*3/uL (ref 0.0–0.1)
EOS PCT: 2 % (ref 0–5)
Eosinophils Absolute: 0.1 10*3/uL (ref 0.0–0.7)
HCT: 25.7 % — ABNORMAL LOW (ref 36.0–46.0)
Hemoglobin: 7.2 g/dL — ABNORMAL LOW (ref 12.0–15.0)
LYMPHS PCT: 17 % (ref 12–46)
Lymphs Abs: 0.7 10*3/uL (ref 0.7–4.0)
MCH: 21.4 pg — ABNORMAL LOW (ref 26.0–34.0)
MCHC: 28 g/dL — ABNORMAL LOW (ref 30.0–36.0)
MCV: 76.5 fL — AB (ref 78.0–100.0)
MONOS PCT: 11 % (ref 3–12)
Monocytes Absolute: 0.5 10*3/uL (ref 0.1–1.0)
NEUTROS ABS: 2.8 10*3/uL (ref 1.7–7.7)
NEUTROS PCT: 70 % (ref 43–77)
Platelets: 272 10*3/uL (ref 150–400)
RBC: 3.36 MIL/uL — ABNORMAL LOW (ref 3.87–5.11)
RDW: 19.2 % — AB (ref 11.5–15.5)
WBC: 4.1 10*3/uL (ref 4.0–10.5)

## 2014-07-04 LAB — I-STAT BETA HCG BLOOD, ED (MC, WL, AP ONLY)

## 2014-07-04 LAB — BASIC METABOLIC PANEL
ANION GAP: 6 (ref 5–15)
BUN: 7 mg/dL (ref 6–20)
CALCIUM: 8.7 mg/dL — AB (ref 8.9–10.3)
CO2: 27 mmol/L (ref 22–32)
Chloride: 106 mmol/L (ref 101–111)
Creatinine, Ser: 0.72 mg/dL (ref 0.44–1.00)
GLUCOSE: 82 mg/dL (ref 65–99)
POTASSIUM: 2.9 mmol/L — AB (ref 3.5–5.1)
Sodium: 139 mmol/L (ref 135–145)

## 2014-07-04 LAB — TSH: TSH: 1.801 u[IU]/mL (ref 0.350–4.500)

## 2014-07-04 LAB — TROPONIN I

## 2014-07-04 LAB — ABO/RH: ABO/RH(D): O POS

## 2014-07-04 LAB — MRSA PCR SCREENING: MRSA by PCR: NEGATIVE

## 2014-07-04 LAB — PHOSPHORUS: PHOSPHORUS: 2.7 mg/dL (ref 2.5–4.6)

## 2014-07-04 LAB — PREPARE RBC (CROSSMATCH)

## 2014-07-04 LAB — MAGNESIUM: Magnesium: 1.7 mg/dL (ref 1.7–2.4)

## 2014-07-04 LAB — POC OCCULT BLOOD, ED: Fecal Occult Bld: NEGATIVE

## 2014-07-04 LAB — I-STAT TROPONIN, ED: TROPONIN I, POC: 0 ng/mL (ref 0.00–0.08)

## 2014-07-04 MED ORDER — ASPIRIN 81 MG PO CHEW
324.0000 mg | CHEWABLE_TABLET | Freq: Once | ORAL | Status: DC
  Filled 2014-07-04: qty 4

## 2014-07-04 MED ORDER — SODIUM CHLORIDE 0.9 % IJ SOLN: 3.0000 mL | INTRAMUSCULAR | Status: DC | PRN

## 2014-07-04 MED ORDER — ONDANSETRON HCL 4 MG/2ML IJ SOLN
4.0000 mg | Freq: Four times a day (QID) | INTRAMUSCULAR | Status: DC | PRN
Start: 2014-07-04 — End: 2014-07-05

## 2014-07-04 MED ORDER — MORPHINE SULFATE 4 MG/ML IJ SOLN
4.0000 mg | Freq: Once | INTRAMUSCULAR | Status: AC
  Administered 2014-07-04: 4 mg via INTRAVENOUS
  Filled 2014-07-04: qty 1

## 2014-07-04 MED ORDER — ONDANSETRON HCL 4 MG PO TABS: 4.0000 mg | ORAL_TABLET | Freq: Four times a day (QID) | ORAL | Status: DC | PRN

## 2014-07-04 MED ORDER — ACETAMINOPHEN 650 MG RE SUPP: 650.0000 mg | Freq: Four times a day (QID) | RECTAL | Status: DC | PRN

## 2014-07-04 MED ORDER — ACETAMINOPHEN 325 MG PO TABS
325.0000 mg | ORAL_TABLET | Freq: Once | ORAL | Status: AC
  Administered 2014-07-04: 325 mg via ORAL
  Filled 2014-07-04: qty 1

## 2014-07-04 MED ORDER — POTASSIUM CHLORIDE CRYS ER 20 MEQ PO TBCR
40.0000 meq | EXTENDED_RELEASE_TABLET | Freq: Once | ORAL | Status: AC
  Administered 2014-07-04: 40 meq via ORAL
  Filled 2014-07-04: qty 2

## 2014-07-04 MED ORDER — FERROUS SULFATE 325 (65 FE) MG PO TABS
325.0000 mg | ORAL_TABLET | Freq: Three times a day (TID) | ORAL | Status: DC
  Administered 2014-07-05 (×2): 325 mg via ORAL
  Filled 2014-07-04 (×4): qty 1

## 2014-07-04 MED ORDER — MYCOPHENOLATE MOFETIL 500 MG PO TABS: 1000.0000 mg | ORAL_TABLET | Freq: Two times a day (BID) | ORAL | Status: DC

## 2014-07-04 MED ORDER — HYDRALAZINE HCL 20 MG/ML IJ SOLN
10.0000 mg | Freq: Four times a day (QID) | INTRAMUSCULAR | Status: DC | PRN
  Filled 2014-07-04: qty 1

## 2014-07-04 MED ORDER — PANTOPRAZOLE SODIUM 40 MG PO TBEC
40.0000 mg | DELAYED_RELEASE_TABLET | Freq: Every day | ORAL | Status: DC
  Administered 2014-07-05: 40 mg via ORAL
  Filled 2014-07-04: qty 1

## 2014-07-04 MED ORDER — NITROGLYCERIN 0.4 MG SL SUBL
0.4000 mg | SUBLINGUAL_TABLET | SUBLINGUAL | Status: DC | PRN
  Filled 2014-07-04 (×2): qty 1

## 2014-07-04 MED ORDER — ACETAMINOPHEN 325 MG PO TABS
650.0000 mg | ORAL_TABLET | Freq: Four times a day (QID) | ORAL | Status: DC | PRN
  Administered 2014-07-04 – 2014-07-05 (×3): 650 mg via ORAL
  Filled 2014-07-04 (×3): qty 2

## 2014-07-04 MED ORDER — SODIUM CHLORIDE 0.9 % IV SOLN
Freq: Once | INTRAVENOUS | Status: AC
  Administered 2014-07-04: 15:00:00 via INTRAVENOUS

## 2014-07-04 MED ORDER — SODIUM CHLORIDE 0.9 % IJ SOLN: 3.0000 mL | Freq: Two times a day (BID) | INTRAMUSCULAR | Status: DC

## 2014-07-04 MED ORDER — FUROSEMIDE 10 MG/ML IJ SOLN
20.0000 mg | Freq: Once | INTRAMUSCULAR | Status: AC
  Administered 2014-07-04: 20 mg via INTRAVENOUS

## 2014-07-04 MED ORDER — SODIUM CHLORIDE 0.9 % IV SOLN: 250.0000 mL | INTRAVENOUS | Status: DC | PRN

## 2014-07-04 MED ORDER — MYCOPHENOLATE MOFETIL 250 MG PO CAPS
1000.0000 mg | ORAL_CAPSULE | Freq: Two times a day (BID) | ORAL | Status: DC
  Administered 2014-07-04 – 2014-07-05 (×2): 1000 mg via ORAL
  Filled 2014-07-04 (×3): qty 4

## 2014-07-04 MED ORDER — LISINOPRIL 40 MG PO TABS
40.0000 mg | ORAL_TABLET | Freq: Every day | ORAL | Status: DC
  Administered 2014-07-04 – 2014-07-05 (×2): 40 mg via ORAL
  Filled 2014-07-04 (×2): qty 1

## 2014-07-04 MED ORDER — POTASSIUM CHLORIDE CRYS ER 20 MEQ PO TBCR
40.0000 meq | EXTENDED_RELEASE_TABLET | ORAL | Status: AC
  Administered 2014-07-04 (×2): 40 meq via ORAL
  Filled 2014-07-04 (×2): qty 2

## 2014-07-04 MED ORDER — PREDNISONE 20 MG PO TABS
30.0000 mg | ORAL_TABLET | Freq: Every day | ORAL | Status: DC
  Administered 2014-07-04 – 2014-07-05 (×2): 30 mg via ORAL
  Filled 2014-07-04 (×2): qty 1

## 2014-07-04 MED ORDER — SODIUM CHLORIDE 0.9 % IV SOLN: Freq: Once | INTRAVENOUS | Status: DC

## 2014-07-04 NOTE — ED Provider Notes (Signed)
Patient seen/examined in the Emergency Department in conjunction with Midlevel Provider  Patient reports dizziness, chest pain for several months Exam : awake/alert, no distress.  HTN noted Plan: EKG reviewed and no significant change from prior Plan to monitor and perform cardiac evaluation Pt stable at this time    Ripley Fraise, MD 07/04/14 1031

## 2014-07-04 NOTE — Progress Notes (Signed)
Report received from the ED at 1550 and pt arrived to the unit via stretcher with belongings to the side at 1635. VSS, telemetry applied and verified; pt IV intact with blood transfusing upon arrival to the unit. Pt oriented to the unit and room; call light within reach and will closely monitor. Francis Gaines Jonel Sick RN.

## 2014-07-04 NOTE — ED Notes (Signed)
MD at bedside. 

## 2014-07-04 NOTE — ED Notes (Signed)
C/o dizziness associated w/HA and "feeling of balance" onset 6 months Sx have been getting worse Alert, no signs of acute distress.

## 2014-07-04 NOTE — ED Notes (Signed)
Blood consent signed and at bedside  

## 2014-07-04 NOTE — ED Provider Notes (Signed)
Christine Todd is a 39 y.o. female who presents to Urgent Care today for dizziness and fall. Patient has had 6 months of dizziness in 2 or 3 months of headaches. She describes the dizziness as a lightheaded feeling. Yesterday her dizziness got much worse and she fell. She is not sure if she passed out. She did not hit her head. She denies any vision changes fevers or chills.   Past Medical History  Diagnosis Date  . Lupus erythematosus    History reviewed. No pertinent past surgical history. History  Substance Use Topics  . Smoking status: Never Smoker   . Smokeless tobacco: Not on file  . Alcohol Use: No   ROS as above Medications: No current facility-administered medications for this encounter.   Current Outpatient Prescriptions  Medication Sig Dispense Refill  . acetaminophen (TYLENOL) 650 MG CR tablet Take 650 mg by mouth every 8 (eight) hours as needed. For pain    . predniSONE (DELTASONE) 20 MG tablet Take 40 mg by mouth daily.    Marland Kitchen atenolol (TENORMIN) 50 MG tablet Take 50 mg by mouth daily.    . furosemide (LASIX) 20 MG tablet Take 20 mg by mouth daily.    Marland Kitchen HYDROcodone-acetaminophen (NORCO/VICODIN) 5-325 MG per tablet Take 1 tablet by mouth every 4 (four) hours as needed for severe pain. 12 tablet 0   Allergies  Allergen Reactions  . Sulfa Antibiotics   . Sulfonamide Derivatives     REACTION: rash, itching     Exam:  BP 165/94 mmHg  Pulse 106  Temp(Src) 98.5 F (36.9 C) (Oral)  Resp 18  SpO2 100%  LMP 05/28/2014  Orthostatic VS for the past 24 hrs:  BP- Lying Pulse- Lying BP- Sitting Pulse- Sitting BP- Standing at 0 minutes Pulse- Standing at 0 minutes  07/04/14 0920 (!) 154/96 mmHg 89 (!) 155/96 mmHg 105 (!) 140/97 mmHg 107    Gen: Well NAD HEENT: EOMI,  MMM, PERRLA Lungs: Normal work of breathing. CTABL Heart: RRR no MRG Abd: NABS, Soft. Nondistended, Nontender Exts: Brisk capillary refill, warm and well perfused.  Neuro: Alert and oriented normal  coordination and gait  ED ECG REPORT   Date: 07/04/2014  Rate: 92  Rhythm: normal sinus rhythm  QRS Axis: normal  Intervals: PR shortened and 106  ST/T Wave abnormalities: nonspecific ST/T changes and T wave inversions in the lateral precordial lead with 1 mm ST segment depression and V4, and aVF. 1 mm ST elevation in aVR. No large Q waves. patient additionally has PVCs on EKG  Conduction Disutrbances:none  Narrative Interpretation:   Old EKG Reviewed: changes noted  I have personally reviewed the EKG tracing and agree with the computerized printout as noted.    No results found for this or any previous visit (from the past 24 hour(s)). No results found.  Assessment and Plan: 39 y.o. female with headache near syncope orthostatic changes and new EKG change. Transfer to ED for further evaluation and management.  Discussed warning signs or symptoms. Please see discharge instructions. Patient expresses understanding.     Gregor Hams, MD 07/04/14 [phone]

## 2014-07-04 NOTE — H&P (Signed)
[facility] History and Physical  [name] HQP:[id number] DOB: 25-Dec-1975 DOA: 07/04/2014  Referring physician: Dr. Christy Gentles  PCP: Rosette Reveal, MD   Chief Complaint: chest discomfort, lightheadedness, dizziness and generalized weakness    HPI: Christine Todd is a 39 y.o. female HTN and Lupus; who presented to ED due to generalized weakness and CP. Patient reported having some intermittent episodes of mid chest/epigastric pain for the last month or so; also complaining of lightheadedness and generalized weakness. Patient denies fever, chills, nausea, vomiting, dysuria, hematuria, melena, hematochezia or any other complaints.  In ED, workup demonstrated Hgb of 7.2, troponin of 0.00, no EKG changes, low potassium 2.9 and no signs of acute infiltrates on CXR. [facility] called to admit patient for symptomatic anemia and chest discomfort.    Review of Systems:  Negative except as otherwise mentioned in HPI    Past Medical History  Diagnosis Date  . Lupus erythematosus    History reviewed. No pertinent past surgical history.   Social History:  reports that she has never smoked. She does not have any smokeless tobacco history on file. She reports that she does not drink alcohol or use illicit drugs.  Allergies  Allergen Reactions  . Sulfa Antibiotics   . Sulfonamide Derivatives     REACTION: rash, itching   Family History  Problem Relation Age of Onset  . Diabetes Father   . Diabetes Other   . Cancer Other   . Hypertension Other    Prior to Admission medications   Medication Sig Start Date End Date Taking? Authorizing Provider  acetaminophen (TYLENOL) 650 MG CR tablet Take 650 mg by mouth every 8 (eight) hours as needed. For pain   Yes Historical Provider, MD  ferrous sulfate 325 (65 FE) MG tablet Take 325 mg by mouth daily with breakfast.   Yes Historical Provider, MD  mycophenolate (CELLCEPT) 500 MG tablet Take 1,000 mg by mouth 2 (two) times daily.   Yes Historical  Provider, MD  predniSONE (DELTASONE) 20 MG tablet Take 30 mg by mouth daily.    Yes Historical Provider, MD   Physical Exam: Filed Vitals:   07/04/14 1415 07/04/14 1430 07/04/14 1445 07/04/14 1500  BP: 172/106 154/75 174/80 150/86  Pulse: 79 84 85 87  Temp:      TempSrc:      Resp: 19 18    Height:      Weight:      SpO2: 100% 100% 100% 99%    Wt Readings from Last 3 Encounters:  07/04/14 78.019 kg (172 lb)  03/16/11 74.844 kg (165 lb)  03/06/11 74.844 kg (165 lb)    General:  Appears comfortable; able to follow commands and speaking in full sentences. Eyes: PERRL, normal lids, irises & conjunctiva; no icterus, no nystagmus ENT: grossly normal hearing, lips & tongue; no thrush, no erythema and no exudates  Neck: no LAD, masses or thyromegaly; no JVD Cardiovascular: regular rate, no rubs, no gallops; S1 and S2; slight tachcyardia Respiratory: CTA bilaterally, no w/r/r. Normal respiratory effort. Abdomen: soft, nt, nd, positive BS Skin: no rash or induration seen on limited exam Musculoskeletal: grossly normal tone BUE/BLE Psychiatric: grossly normal mood and affect, speech fluent and appropriate Neurologic: grossly non-focal.          Labs on Admission:  Basic Metabolic Panel:  Recent Labs Lab 07/04/14 1028  NA 139  K 2.9*  CL 106  CO2 27  GLUCOSE 82  BUN 7  CREATININE 0.72  CALCIUM 8.7*  CBC:  Recent Labs Lab 07/04/14 1028  WBC 4.1  NEUTROABS 2.8  HGB 7.2*  HCT 25.7*  MCV 76.5*  PLT 272   Radiological Exams on Admission: Dg Chest 2 View  07/04/2014   CLINICAL DATA:  Dizziness with headaches for 3 months. History of systemic lupus erythematosus on chronic steroid therapy. Initial encounter.  EXAM: CHEST  2 VIEW  COMPARISON:  03/17/2011 radiographs.  FINDINGS: There is stable enlargement of the cardiac silhouette consistent with cardiomegaly and/or pericardial effusion. The mediastinal contours are stable. The pulmonary vascularity is stable. Previously  demonstrated patchy right basilar airspace disease has improved. There are probable chronic opacities at both lung bases. No significant pleural effusion. The bones appear normal.  IMPRESSION: Interval improvement in previously demonstrated right lower lobe airspace disease. 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 Consider further evaluation with high-resolution chest CT. Stable cardiomegaly and/or pericardial effusion.   Electronically Signed   By: Richardean Sale M.D.   On: 07/04/2014 11:02    EKG:  No acute ischemic changes appreciated, sinus rhythm   Assessment/Plan 1-dizziness, generalized weakness and chest discomfort: in patient with symptomatic anemia. Heart score is 1. -troponin neg and no EKG changes -will cycle troponin -will transfuse 2 units of PRBC's -follow Hgb trend -start ferrous sulfate TID -patient started on PPI -no orthostatic abnormalities seen   2-symptomatic anemia: due to decrease production with hx of Lupus and/or due to increase loses during menstrual cycles. -will transfuse 2 units of PRBC's -follow Hgb trend  3-GERD: chronically on steroids -will start PPI  4-SLE: will continue home medication regimen. -patient is on Cellcept and prednisone   5-Benign essential HTN: patient was not taking any antihypertensive agent at home -will start heart healthy diet -patient will be initiated on lisinopril -PRN hydralazine ordered  6-Hypokalemia: will check Mg level, potassium and will replete as needed -admitted to telemetry due to low potassium   Code Status: Full DVT Prophylaxis:SCD's and early ambulation (per patient request) Family Communication: no family at bedside Disposition Plan: LOS < 2; telemetry; observation  Time spent: [place] Pager [phone]

## 2014-07-04 NOTE — ED Provider Notes (Signed)
CSN: [id number]     Arrival date & time 07/04/14  1601 History   First MD Initiated Contact with Patient 07/04/14 1001     Chief Complaint  Patient presents with  . Headache  . Dizziness  . Chest Pain     (Consider location/radiation/quality/duration/timing/severity/associated sxs/prior Treatment) HPI Comments: Patient presents to the ED with a chief complaint of headache, dizziness, and chest pain.  Patient states that she has been having the symptoms intermittently for the past 6 months.  She denies any associated SOB.  States that her pain is moderate.  She has not tried taking anything to alleviate her symptoms.  There are no aggravating or alleviating factors.  Seen today at [facility] and transferred to the ED for further evaluation.  The history is provided by the patient. No language interpreter was used.    Past Medical History  Diagnosis Date  . Lupus erythematosus    History reviewed. No pertinent past surgical history. Family History  Problem Relation Age of Onset  . Diabetes Father   . Diabetes Other   . Cancer Other   . Hypertension Other    History  Substance Use Topics  . Smoking status: Never Smoker   . Smokeless tobacco: Not on file  . Alcohol Use: No   OB History    No data available     Review of Systems  Constitutional: Negative for fever and chills.  Respiratory: Negative for shortness of breath.   Cardiovascular: Positive for chest pain.  Gastrointestinal: Negative for nausea, vomiting, diarrhea and constipation.  Genitourinary: Negative for dysuria.  Neurological: Positive for dizziness and headaches.  All other systems reviewed and are negative.     Allergies  Sulfa antibiotics and Sulfonamide derivatives  Home Medications   Prior to Admission medications   Medication Sig Start Date End Date Taking? Authorizing Provider  acetaminophen (TYLENOL) 650 MG CR tablet Take 650 mg by mouth every 8 (eight) hours as needed. For pain    Historical  Provider, MD  atenolol (TENORMIN) 50 MG tablet Take 50 mg by mouth daily.    Historical Provider, MD  furosemide (LASIX) 20 MG tablet Take 20 mg by mouth daily.    Historical Provider, MD  HYDROcodone-acetaminophen (NORCO/VICODIN) 5-325 MG per tablet Take 1 tablet by mouth every 4 (four) hours as needed for severe pain. 04/17/13   Domenic Moras, PA-C  predniSONE (DELTASONE) 20 MG tablet Take 40 mg by mouth daily.    Historical Provider, MD   BP 169/109 mmHg  Pulse 85  Temp(Src) 98.2 F (36.8 C) (Oral)  Resp 17  Ht 5\' 7"  (1.702 m)  Wt 172 lb (78.019 kg)  BMI 26.93 kg/m2  SpO2 100%  LMP 05/28/2014 Physical Exam  Constitutional: She is oriented to person, place, and time. She appears well-developed and well-nourished.  HENT:  Head: Normocephalic and atraumatic.  Eyes: Conjunctivae and EOM are normal. Pupils are equal, round, and reactive to light.  Neck: Normal range of motion. Neck supple.  Cardiovascular: Normal rate and regular rhythm.  Exam reveals no gallop and no friction rub.   No murmur heard. Pulmonary/Chest: Effort normal and breath sounds normal. No respiratory distress. She has no wheezes. She has no rales. She exhibits no tenderness.  CTAB  Abdominal: Soft. Bowel sounds are normal. She exhibits no distension and no mass. There is no tenderness. There is no rebound and no guarding.  Musculoskeletal: Normal range of motion. She exhibits no edema or tenderness.  Neurological: She is alert  and oriented to person, place, and time.  CN 3-12 intact, normal finger to nose, no pronator drift, speech is clear, movements are goal oriented, ambulates without difficulty  Skin: Skin is warm and dry.  Psychiatric: She has a normal mood and affect. Her behavior is normal. Judgment and thought content normal.  Nursing note and vitals reviewed.   ED Course  Procedures (including critical care time) Results for orders placed or performed during the hospital encounter of 07/04/14  CBC with  Differential/Platelet  Result Value Ref Range   WBC 4.1 4.0 - 10.5 K/uL   RBC 3.36 (L) 3.87 - 5.11 MIL/uL   Hemoglobin 7.2 (L) 12.0 - 15.0 g/dL   HCT 25.7 (L) 36.0 - 46.0 %   MCV 76.5 (L) 78.0 - 100.0 fL   MCH 21.4 (L) 26.0 - 34.0 pg   MCHC 28.0 (L) 30.0 - 36.0 g/dL   RDW 19.2 (H) 11.5 - 15.5 %   Platelets 272 150 - 400 K/uL   Neutrophils Relative % 70 43 - 77 %   Neutro Abs 2.8 1.7 - 7.7 K/uL   Lymphocytes Relative 17 12 - 46 %   Lymphs Abs 0.7 0.7 - 4.0 K/uL   Monocytes Relative 11 3 - 12 %   Monocytes Absolute 0.5 0.1 - 1.0 K/uL   Eosinophils Relative 2 0 - 5 %   Eosinophils Absolute 0.1 0.0 - 0.7 K/uL   Basophils Relative 0 0 - 1 %   Basophils Absolute 0.0 0.0 - 0.1 K/uL  Basic metabolic panel  Result Value Ref Range   Sodium 139 135 - 145 mmol/L   Potassium 2.9 (L) 3.5 - 5.1 mmol/L   Chloride 106 101 - 111 mmol/L   CO2 27 22 - 32 mmol/L   Glucose, Bld 82 65 - 99 mg/dL   BUN 7 6 - 20 mg/dL   Creatinine, Ser 0.72 0.44 - 1.00 mg/dL   Calcium 8.7 (L) 8.9 - 10.3 mg/dL   GFR calc non Af Amer >60 >60 mL/min   GFR calc Af Amer >60 >60 mL/min   Anion gap 6 5 - 15  I-stat troponin, ED  Result Value Ref Range   Troponin i, poc 0.00 0.00 - 0.08 ng/mL   Comment 3          I-Stat Beta hCG blood, ED ([facility], [facility], [facility] only)  Result Value Ref Range   I-stat hCG, quantitative <5.0 <5 mIU/mL   Comment 3          POC occult blood, ED  Result Value Ref Range   Fecal Occult Bld NEGATIVE NEGATIVE   Dg Chest 2 View  07/04/2014   CLINICAL DATA:  Dizziness with headaches for 3 months. History of systemic lupus erythematosus on chronic steroid therapy. Initial encounter.  EXAM: CHEST  2 VIEW  COMPARISON:  03/17/2011 radiographs.  FINDINGS: There is stable enlargement of the cardiac silhouette consistent with cardiomegaly and/or pericardial effusion. The mediastinal contours are stable. The pulmonary vascularity is stable. Previously demonstrated patchy right basilar airspace disease has improved.  There are probable chronic opacities at both lung bases. No significant pleural effusion. The bones appear normal.  IMPRESSION: Interval improvement in previously demonstrated right lower lobe airspace disease. 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 Consider further evaluation with high-resolution chest CT. Stable cardiomegaly and/or pericardial effusion.   Electronically Signed   By: Richardean Sale M.D.   On: 07/04/2014 11:02  Imaging Review No results found.   EKG Interpretation   Date/Time:  Wednesday July 04 2014 10:04:21 EDT Ventricular Rate:  86 PR Interval:  104 QRS Duration: 93 QT Interval:  362 QTC Calculation: 433 R Axis:   20 Text Interpretation:  Sinus rhythm Short PR interval Borderline repol  abnormality, diffuse leads No significant change since last tracing  Confirmed by Christy Gentles  MD, DONALD ([id number]) on 07/04/2014 10:12:15 AM      MDM   Final diagnoses:  Chest pain  Symptomatic anemia    Patient seen by and discussed with Dr. Christy Gentles, who recommends delta troponin at 3 hours, and probable discharge to home.  Hemoglobin is 7.2. This can certainly be the reason for the patient's dizziness, and possible chest pain.  Potassium is 2.9, will supplement this in the ED.  Attempt to ambulate patient, however she states that she is too lightheaded. Orthostatic vital signs are normal. She does not have any rectal bleeding, denies any vaginal bleeding. Patient will need to be admitted for symptomatic anemia.  Patient seen by and discussed with Dr. Christy Gentles, who agrees with the plan. Patient discussed with [facility], who will admit the patient. Recommend transfusing 1 unit of blood. Patient understands and agrees the plan.  CRITICAL CARE Performed by: Montine Circle   Total critical care time: 45  Critical care time was exclusive of separately billable procedures and  treating other patients.  Critical care was necessary to treat or prevent imminent or life-threatening deterioration.  Critical care was time spent personally by me on the following activities: development of treatment plan with patient and/or surrogate as well as nursing, discussions with consultants, evaluation of patient's response to treatment, examination of patient, obtaining history from patient or surrogate, ordering and performing treatments and interventions, ordering and review of laboratory studies, ordering and review of radiographic studies, pulse oximetry and re-evaluation of patient's condition.   Montine Circle, PA-C 07/04/14 1359  Ripley Fraise, MD 07/04/14 1425

## 2014-07-04 NOTE — ED Notes (Signed)
Pt requests to hold off on nitro rt already having a ha. Pt states she can't take aspirin with her prednisone.

## 2014-07-04 NOTE — ED Notes (Signed)
Pt arrives via POV from [facility] Pt states she has been having dizziness since January followed by h/a since March that has been an ongoing issue. Pt states she has had cp "for a while" that has worsened today. Pt has Lupus and is on chronic steroid tx of 30mg  of prednisone daily. Pt states she fell yesterday rt dizziness and denies LOC.

## 2014-07-05 DIAGNOSIS — I10 Essential (primary) hypertension: Secondary | ICD-10-CM

## 2014-07-05 DIAGNOSIS — D509 Iron deficiency anemia, unspecified: Secondary | ICD-10-CM | POA: Diagnosis not present

## 2014-07-05 DIAGNOSIS — M329 Systemic lupus erythematosus, unspecified: Secondary | ICD-10-CM

## 2014-07-05 DIAGNOSIS — E876 Hypokalemia: Secondary | ICD-10-CM

## 2014-07-05 DIAGNOSIS — D649 Anemia, unspecified: Secondary | ICD-10-CM

## 2014-07-05 DIAGNOSIS — K219 Gastro-esophageal reflux disease without esophagitis: Secondary | ICD-10-CM

## 2014-07-05 LAB — TYPE AND SCREEN
ABO/RH(D): O POS
ANTIBODY SCREEN: NEGATIVE
UNIT DIVISION: 0
UNIT DIVISION: 0

## 2014-07-05 LAB — TROPONIN I: Troponin I: <0.03 ng/mL (ref <0.031)

## 2014-07-05 LAB — BASIC METABOLIC PANEL
Anion gap: 10 (ref 5–15)
BUN: 10 mg/dL (ref 6–20)
CALCIUM: 8.8 mg/dL — AB (ref 8.9–10.3)
CHLORIDE: 102 mmol/L (ref 101–111)
CO2: 23 mmol/L (ref 22–32)
CREATININE: 0.73 mg/dL (ref 0.44–1.00)
GFR calc Af Amer: >60 mL/min (ref >60)
GFR calc non Af Amer: >60 mL/min (ref >60)
GLUCOSE: 130 mg/dL — AB (ref 65–99)
Potassium: 4.2 mmol/L (ref 3.5–5.1)
Sodium: 135 mmol/L (ref 135–145)

## 2014-07-05 LAB — CBC
HCT: 34.2 % — ABNORMAL LOW (ref 36.0–46.0)
Hemoglobin: 10.3 g/dL — ABNORMAL LOW (ref 12.0–15.0)
MCH: 23.1 pg — ABNORMAL LOW (ref 26.0–34.0)
MCHC: 30.1 g/dL (ref 30.0–36.0)
MCV: 76.7 fL — ABNORMAL LOW (ref 78.0–100.0)
PLATELETS: 278 10*3/uL (ref 150–400)
RBC: 4.46 MIL/uL (ref 3.87–5.11)
RDW: 17.6 % — AB (ref 11.5–15.5)
WBC: 4.7 10*3/uL (ref 4.0–10.5)

## 2014-07-05 MED ORDER — FERROUS SULFATE 325 (65 FE) MG PO TABS: 325.0000 mg | ORAL_TABLET | Freq: Three times a day (TID) | ORAL | Status: DC

## 2014-07-05 MED ORDER — PANTOPRAZOLE SODIUM 40 MG PO TBEC: 40.0000 mg | DELAYED_RELEASE_TABLET | Freq: Every day | ORAL | Status: DC

## 2014-07-05 MED ORDER — LISINOPRIL 40 MG PO TABS: 40.0000 mg | ORAL_TABLET | Freq: Every day | ORAL | Status: DC

## 2014-07-05 MED ORDER — HYDRALAZINE HCL 20 MG/ML IJ SOLN
10.0000 mg | Freq: Four times a day (QID) | INTRAMUSCULAR | Status: DC | PRN
  Administered 2014-07-05: 10 mg via INTRAVENOUS
  Filled 2014-07-05: qty 1

## 2014-07-05 NOTE — Progress Notes (Signed)
Pt refusing orthostatic VS this AM.  States she would like to do it after breakfast.  Claudette Stapler, RN

## 2014-07-05 NOTE — Progress Notes (Signed)
Reviewed discharge instructions with patient. She verbally understood. IV removed. Education completed. Awaiting the arrival of her ride. Will continue to monitor.  Halvor Behrend, Mervin Kung RN

## 2014-07-05 NOTE — Discharge Summary (Signed)
Physician Discharge Summary  Christine Todd ZOX:[id number] DOB: [birth year] DOA: 07/04/2014  PCP: Christine Reveal, MD  Admit date: 07/04/2014 Discharge date: 07/05/2014  Time spent: 35 minutes  Recommendations for Outpatient Follow-up:  Patient will be discharged to home.  Patient will need to follow up with primary care provider within one week of discharge and have repeat CBC.  Patient should continue medications as prescribed.  Patient should follow a heart healthy diet.   Discharge Diagnoses:  Symptomatic anemia/iron deficiency anemia Chest pain, atypical Dizziness and generalized weakness GERD Lupus Essential hypertension Hypokalemia  Discharge Condition: Stable  Diet recommendation: Heart healthy  Filed Weights   07/04/14 1012  Weight: 78.019 kg (172 lb)    History of present illness:  On 07/04/2014 by Dr. Barton Todd Christine Todd is a 39 y.o. female HTN and Lupus; who presented to ED due to generalized weakness and CP. Patient reported having some intermittent episodes of mid chest/epigastric pain for the last month or so; also complaining of lightheadedness and generalized weakness. Patient denies fever, chills, nausea, vomiting, dysuria, hematuria, melena, hematochezia or any other complaints. In ED, workup demonstrated Hgb of 7.2, troponin of 0.00, no EKG changes, low potassium 2.9 and no signs of acute infiltrates on CXR. TR[facility] called to admit patient for symptomatic anemia and chest discomfort.   Hospital Course:  Symptomatic anemia/iron deficiency anemia -Likely secondary decreased production with a history of lupus and/or due to menstrual cycles -Upon admission, hemoglobin was 7.2, patient was given 2 units of packed red blood cells -Currently hemoglobin 10.3 -Patient is continue iron supplementation however increased to 3 times daily -Discussed side effects of iron including constipation -Patient will need close follow-up with her primary care physician and repeat  CBC within 1 week of discharge  Chest pain, atypical -Resolved -Likely secondary to symptomatic anemia -Troponin cycled and negative, no EKG changes noted -Orthostatics negative  Dizziness with generalized weakness -Secondary to anemia -Resolved -TSH 1.8  GERD -Patient is on chronic steroid-dependent -Started on PPI and should continue. -Patient should have outpatient follow-up with gastroenterology  Lupus -Continue home regimen of CellCept and prednisone  Essential hypertension -Patient started on lisinopril -Continue heart healthy diet  Hypokalemia -Replaced, currently 4.2 -Was 2.9 upon admission  Procedures: None  Consultations: None  Discharge Exam: Filed Vitals:   07/05/14 0700  BP: 151/92  Pulse:   Temp:   Resp:      General: Well developed, well nourished, No distress  HEENT: NCAT, mucous membranes moist.  Cardiovascular: S1 S2 auscultated, no rubs, murmurs or gallops. Regular rate and rhythm.  Respiratory: Clear to auscultation bilaterally with equal chest rise  Abdomen: Soft, obese, nontender, nondistended, + bowel sounds  Extremities: warm dry without cyanosis clubbing or edema  Neuro: AAOx3, nonfocal  Psych: Normal affect and demeanor with intact judgement and insight  Discharge Instructions     Medication List    ASK your doctor about these medications        acetaminophen 650 MG CR tablet  Commonly known as:  TYLENOL  Take 650 mg by mouth every 8 (eight) hours as needed. For pain     ferrous sulfate 325 (65 FE) MG tablet  Take 325 mg by mouth daily with breakfast.     mycophenolate 500 MG tablet  Commonly known as:  CELLCEPT  Take 1,000 mg by mouth 2 (two) times daily.     predniSONE 20 MG tablet  Commonly known as:  DELTASONE  Take 30 mg by mouth daily.  Allergies  Allergen Reactions  . Sulfa Antibiotics   . Sulfonamide Derivatives     REACTION: rash, itching      The results of significant diagnostics  from this hospitalization (including imaging, microbiology, ancillary and laboratory) are listed below for reference.    Significant Diagnostic Studies: Dg Chest 2 View  07/04/2014   CLINICAL DATA:  Dizziness with headaches for 3 months. History of systemic lupus erythematosus on chronic steroid therapy. Initial encounter.  EXAM: CHEST  2 VIEW  COMPARISON:  03/17/2011 radiographs.  FINDINGS: There is stable enlargement of the cardiac silhouette consistent with cardiomegaly and/or pericardial effusion. The mediastinal contours are stable. The pulmonary vascularity is stable. Previously demonstrated patchy right basilar airspace disease has improved. There are probable chronic opacities at both lung bases. No significant pleural effusion. The bones appear normal.  IMPRESSION: Interval improvement in previously demonstrated right lower lobe airspace disease. 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 Consider further evaluation with high-resolution chest CT. Stable cardiomegaly and/or pericardial effusion.   Electronically Signed   By: Richardean Sale M.D.   On: 07/04/2014 11:02    Microbiology: Recent Results (from the past 240 hour(s))  MRSA PCR Screening     Status: None   Collection Time: 07/04/14 10:02 AM  Result Value Ref Range Status   MRSA by PCR NEGATIVE NEGATIVE Final    Comment:        The GeneXpert MRSA Assay (FDA approved for NASAL specimens only), is one component of a comprehensive MRSA colonization surveillance program. It is not intended to diagnose MRSA infection nor to guide or monitor treatment for MRSA infections.      Labs: Basic Metabolic Panel:  Recent Labs Lab 07/04/14 1028 07/04/14 1858 07/05/14 0419  NA 139  --  135  K 2.9*  --  4.2  CL 106  --  102  CO2 27  --  23  GLUCOSE 82  --  130*  BUN 7  --  10  CREATININE 0.72  --  0.73  CALCIUM 8.7*  --  8.8*  MG  --   1.7  --   PHOS  --  2.7  --    Liver Function Tests:  Recent Labs Lab 07/04/14 1858  AST 25  ALT 12*  ALKPHOS 69  BILITOT 0.4  PROT 8.2*  ALBUMIN 3.1*   No results for input(s): LIPASE, AMYLASE in the last 168 hours. No results for input(s): AMMONIA in the last 168 hours. CBC:  Recent Labs Lab 07/04/14 1028 07/05/14 0419  WBC 4.1 4.7  NEUTROABS 2.8  --   HGB 7.2* 10.3*  HCT 25.7* 34.2*  MCV 76.5* 76.7*  PLT 272 278   Cardiac Enzymes:  Recent Labs Lab 07/04/14 1650 07/05/14 0419  TROPONINI <0.03 <0.03   BNP: BNP (last 3 results) No results for input(s): BNP in the last 8760 hours.  ProBNP (last 3 results) No results for input(s): PROBNP in the last 8760 hours.  CBG: No results for input(s): GLUCAP in the last 168 hours.     SignedCristal Ford  [facility] 07/05/2014, 10:19 AM

## 2014-07-05 NOTE — Care Management Note (Signed)
Case Management Note  Patient Details  Name: Ieisha Gao MRN: [id number] Date of Birth: [birth year]  Subjective/Objective:    Symptomatic anemia/iron deficiency anemia                Action/Plan:   Expected Discharge Date:     07/05/2014             Expected Discharge Plan:  Home/Self Care  In-House Referral:     Discharge planning Services  CM Consult   Status of Service:  Completed, signed off  Medicare Important Message Given:  No Date Medicare IM Given:    Medicare IM give by:    Date Additional Medicare IM Given:    Additional Medicare Important Message give by:     If discussed at [place] of Stay Meetings, dates discussed:    Additional Comments: NCM spoke to pt and she see Dr. Verlene Mayer, Rheumatologist for her Lupus. Arranged appt with MD on 6/3 at 10:55 am, only available appt until August. Explained she will need labwork completed in 1 week. She states she has her Medicaid card and will contact her Casework. Medicaid is not showing active.    Erenest Rasher, RN 07/05/2014, 12:55 PM

## 2014-07-05 NOTE — Progress Notes (Signed)
Chaplain responded to a consult request. Pt was moving about. Pt seem to be responsive. Chaplain checked to see if there were any other needs. Chaplain  Prayed for Pt. Chaplain will inform other Chaplain of activity.   07/05/14 1000  Clinical Encounter Type  Visited With Patient  Visit Type Spiritual support  Referral From Nurse  Spiritual Encounters  Spiritual Needs Prayer  Stress Factors  Patient Stress Factors Health changes

## 2014-07-05 NOTE — Discharge Instructions (Signed)
Anemia, Nonspecific Anemia is a condition in which the concentration of red blood cells or hemoglobin in the blood is below normal. Hemoglobin is a substance in red blood cells that carries oxygen to the tissues of the body. Anemia results in not enough oxygen reaching these tissues.  CAUSES  Common causes of anemia include:   Excessive bleeding. Bleeding may be internal or external. This includes excessive bleeding from periods (in women) or from the intestine.   Poor nutrition.   Chronic kidney, thyroid, and liver disease.  Bone marrow disorders that decrease red blood cell production.  Cancer and treatments for cancer.  HIV, AIDS, and their treatments.  Spleen problems that increase red blood cell destruction.  Blood disorders.  Excess destruction of red blood cells due to infection, medicines, and autoimmune disorders. SIGNS AND SYMPTOMS   Minor weakness.   Dizziness.   Headache.  Palpitations.   Shortness of breath, especially with exercise.   Paleness.  Cold sensitivity.  Indigestion.  Nausea.  Difficulty sleeping.  Difficulty concentrating. Symptoms may occur suddenly or they may develop slowly.  DIAGNOSIS  Additional blood tests are often needed. These help your health care provider determine the best treatment. Your health care provider will check your stool for blood and look for other causes of blood loss.  TREATMENT  Treatment varies depending on the cause of the anemia. Treatment can include:   Supplements of iron, vitamin B12, or folic acid.   Hormone medicines.   A blood transfusion. This may be needed if blood loss is severe.   Hospitalization. This may be needed if there is significant continual blood loss.   Dietary changes.  Spleen removal. HOME CARE INSTRUCTIONS Keep all follow-up appointments. It often takes many weeks to correct anemia, and having your health care provider check on your condition and your response to  treatment is very important. SEEK IMMEDIATE MEDICAL CARE IF:   You develop extreme weakness, shortness of breath, or chest pain.   You become dizzy or have trouble concentrating.  You develop heavy vaginal bleeding.   You develop a rash.   You have bloody or black, tarry stools.   You faint.   You vomit up blood.   You vomit repeatedly.   You have abdominal pain.  You have a fever or persistent symptoms for more than 2-3 days.   You have a fever and your symptoms suddenly get worse.   You are dehydrated.  MAKE SURE YOU:  Understand these instructions.  Will watch your condition.  Will get help right away if you are not doing well or get worse. Document Released: 02/27/2004 Document Revised: 09/21/2012 Document Reviewed: 07/15/2012 ExitCare Patient Information 2015 [employer]. This information is not intended to replace advice given to you by your health care provider. Make sure you discuss any questions you have with your health care provider.  

## 2015-06-14 ENCOUNTER — Emergency Department (HOSPITAL_COMMUNITY): Payer: Medicaid Other

## 2015-06-14 ENCOUNTER — Encounter (HOSPITAL_COMMUNITY): Payer: Self-pay | Admitting: Emergency Medicine

## 2015-06-14 ENCOUNTER — Emergency Department (HOSPITAL_COMMUNITY)
Admission: EM | Admit: 2015-06-14 | Discharge: 2015-06-14 | Disposition: A | Discharge status: Home / Self Care | Payer: Medicaid Other | Attending: Emergency Medicine | Admitting: Emergency Medicine

## 2015-06-14 DIAGNOSIS — Z79899 Other long term (current) drug therapy: Secondary | ICD-10-CM | POA: Diagnosis not present

## 2015-06-14 DIAGNOSIS — R000 Tachycardia, unspecified: Secondary | ICD-10-CM | POA: Diagnosis not present

## 2015-06-14 DIAGNOSIS — N12 Tubulo-interstitial nephritis, not specified as acute or chronic: Secondary | ICD-10-CM | POA: Diagnosis not present

## 2015-06-14 DIAGNOSIS — Z7952 Long term (current) use of systemic steroids: Secondary | ICD-10-CM | POA: Diagnosis not present

## 2015-06-14 DIAGNOSIS — Z8701 Personal history of pneumonia (recurrent): Secondary | ICD-10-CM | POA: Insufficient documentation

## 2015-06-14 DIAGNOSIS — M1990 Unspecified osteoarthritis, unspecified site: Secondary | ICD-10-CM | POA: Diagnosis not present

## 2015-06-14 DIAGNOSIS — Z3202 Encounter for pregnancy test, result negative: Secondary | ICD-10-CM | POA: Diagnosis not present

## 2015-06-14 DIAGNOSIS — R109 Unspecified abdominal pain: Secondary | ICD-10-CM | POA: Diagnosis present

## 2015-06-14 DIAGNOSIS — D649 Anemia, unspecified: Secondary | ICD-10-CM | POA: Diagnosis not present

## 2015-06-14 DIAGNOSIS — Z8719 Personal history of other diseases of the digestive system: Secondary | ICD-10-CM | POA: Diagnosis not present

## 2015-06-14 LAB — COMPREHENSIVE METABOLIC PANEL
ALT: 8 U/L — AB (ref 14–54)
AST: 13 U/L — ABNORMAL LOW (ref 15–41)
Albumin: 2.8 g/dL — ABNORMAL LOW (ref 3.5–5.0)
Alkaline Phosphatase: 60 U/L (ref 38–126)
Anion gap: 12 (ref 5–15)
BILIRUBIN TOTAL: 0.3 mg/dL (ref 0.3–1.2)
BUN: 11 mg/dL (ref 6–20)
CALCIUM: 9 mg/dL (ref 8.9–10.3)
CO2: 22 mmol/L (ref 22–32)
CREATININE: 0.89 mg/dL (ref 0.44–1.00)
Chloride: 105 mmol/L (ref 101–111)
GFR calc non Af Amer: >60 mL/min (ref >60)
Glucose, Bld: 101 mg/dL — ABNORMAL HIGH (ref 65–99)
Potassium: 3.6 mmol/L (ref 3.5–5.1)
SODIUM: 139 mmol/L (ref 135–145)
TOTAL PROTEIN: 7.4 g/dL (ref 6.5–8.1)

## 2015-06-14 LAB — URINALYSIS, ROUTINE W REFLEX MICROSCOPIC
BILIRUBIN URINE: NEGATIVE
Glucose, UA: NEGATIVE mg/dL
Ketones, ur: NEGATIVE mg/dL
NITRITE: POSITIVE — AB
PROTEIN: 100 mg/dL — AB
Specific Gravity, Urine: 1.016 (ref 1.005–1.030)
pH: 5.5 (ref 5.0–8.0)

## 2015-06-14 LAB — URINE MICROSCOPIC-ADD ON

## 2015-06-14 LAB — CBC
HCT: 30.9 % — ABNORMAL LOW (ref 36.0–46.0)
Hemoglobin: 9.3 g/dL — ABNORMAL LOW (ref 12.0–15.0)
MCH: 25.3 pg — AB (ref 26.0–34.0)
MCHC: 30.1 g/dL (ref 30.0–36.0)
MCV: 84.2 fL (ref 78.0–100.0)
PLATELETS: 211 10*3/uL (ref 150–400)
RBC: 3.67 MIL/uL — AB (ref 3.87–5.11)
RDW: 15.2 % (ref 11.5–15.5)
WBC: 14.3 10*3/uL — ABNORMAL HIGH (ref 4.0–10.5)

## 2015-06-14 LAB — POC URINE PREG, ED: PREG TEST UR: NEGATIVE

## 2015-06-14 MED ORDER — MORPHINE SULFATE (PF) 4 MG/ML IV SOLN
4.0000 mg | Freq: Once | INTRAVENOUS | Status: AC
  Administered 2015-06-14: 4 mg via INTRAVENOUS
  Filled 2015-06-14: qty 1

## 2015-06-14 MED ORDER — SODIUM CHLORIDE 0.9 % IV BOLUS (SEPSIS)
1000.0000 mL | Freq: Once | INTRAVENOUS | Status: AC
  Administered 2015-06-14: 1000 mL via INTRAVENOUS

## 2015-06-14 MED ORDER — HYDROCODONE-ACETAMINOPHEN 5-325 MG PO TABS: 1.0000 | ORAL_TABLET | ORAL | Status: DC | PRN

## 2015-06-14 MED ORDER — ONDANSETRON HCL 4 MG/2ML IJ SOLN
4.0000 mg | Freq: Once | INTRAMUSCULAR | Status: AC
  Administered 2015-06-14: 4 mg via INTRAVENOUS
  Filled 2015-06-14: qty 2

## 2015-06-14 MED ORDER — CIPROFLOXACIN HCL 500 MG PO TABS: 500.0000 mg | ORAL_TABLET | Freq: Two times a day (BID) | ORAL | Status: DC

## 2015-06-14 MED ORDER — DEXTROSE 5 % IV SOLN
1.0000 g | Freq: Once | INTRAVENOUS | Status: AC
  Administered 2015-06-14: 1 g via INTRAVENOUS
  Filled 2015-06-14: qty 10

## 2015-06-14 NOTE — ED Notes (Signed)
Pt. reports right flank pain with fever , nausea and concentrated/cloudy urine onset last week .

## 2015-06-14 NOTE — ED Provider Notes (Signed)
  Physical Exam  BP 145/97 mmHg  Pulse 85  Temp(Src) 99.3 F (37.4 C) (Oral)  Resp 16  SpO2 100%  Physical Exam  ED Course  Procedures  MDM CT scan is returned and shows likely pyelonephritis. No visible obstructing stone. Patient states she can manage this at home. IV antibodies given here and will give Cipro for home. Will discharge.      Davonna Belling, MD 06/14/15 1008

## 2015-06-14 NOTE — Discharge Instructions (Signed)

## 2015-06-14 NOTE — ED Notes (Signed)
Attempted IV access x2, unsuccessful.

## 2015-06-14 NOTE — ED Notes (Signed)
Patient transported to CT 

## 2015-06-14 NOTE — ED Notes (Signed)
Back from CT

## 2015-06-14 NOTE — ED Provider Notes (Signed)
CSN: [id number]     Arrival date & time 06/14/15  0503 History   First MD Initiated Contact with Patient 06/14/15 0531     Chief Complaint  Patient presents with  . Flank Pain     (Consider location/radiation/quality/duration/timing/severity/associated sxs/prior Treatment) HPI  This is a 40 year old female with history of lupus, anemia who presents with right flank pain and fever. Patient reports she has had waxing and waning right flank pain for the last 1 week. She has not noted any hematuria or dysuria but has noted cloudy urine. Has a history of UTI. Currently she states her pain is sharp and nonradiating. Her pain is 8 out of 10. She took her temperature at home tonight and it was greater than 101. She did take Tylenol. Denies any vomiting or diarrhea. Does endorse nausea. No history of kidney stones.  Past Medical History  Diagnosis Date  . Pneumonia "several times"  . 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 SLE (systemic lupus erythematosus) ([place])   . Anemia   . History of blood transfusion 07/04/2014    "related to anemia"  . GERD (gastroesophageal reflux disease)   . Headache     "weekly" (07/04/2014)  . Arthritis     "hands and feet" (07/04/2014)   Past Surgical History  Procedure Laterality Date  . Dilation and curettage of uterus  2009  . Renal biopsy, percutaneous  ?2013   Family History  Problem Relation Age of Onset  . Diabetes Father   . Diabetes Other   . Cancer Other   . Hypertension Other    Social History  Substance Use Topics  . Smoking status: Never Smoker   . Smokeless tobacco: Never Used  . Alcohol Use: Yes   OB History    No data available     Review of Systems  Constitutional: Positive for fever and chills.  Respiratory: Negative for shortness of breath.   Cardiovascular: Negative for chest pain.  Gastrointestinal: Positive for nausea. Negative for vomiting and abdominal pain.  Genitourinary: Positive for flank pain. Negative for  dysuria, hematuria and difficulty urinating.       Foul odor  All other systems reviewed and are negative.     Allergies  Sulfa antibiotics and Sulfonamide derivatives  Home Medications   Prior to Admission medications   Medication Sig Start Date End Date Taking? Authorizing Provider  acetaminophen (TYLENOL) 650 MG CR tablet Take 650 mg by mouth every 8 (eight) hours as needed for pain.    Yes Historical Provider, MD  ferrous sulfate 325 (65 FE) MG tablet Take 1 tablet (325 mg total) by mouth 3 (three) times daily with meals. Patient taking differently: Take 325 mg by mouth every other day.  07/05/14  Yes Maryann Mikhail, DO  mycophenolate (CELLCEPT) 500 MG tablet Take 1,000 mg by mouth 2 (two) times a week.    Yes Historical Provider, MD  OVER THE COUNTER MEDICATION Take 1 Package by mouth daily. Vitamin b and biotin packet   Yes Historical Provider, MD  predniSONE (DELTASONE) 20 MG tablet Take 25 mg by mouth daily.    Yes Historical Provider, MD   BP 151/92 mmHg  Pulse 103  Temp(Src) 99.3 F (37.4 C) (Oral)  Resp 18  SpO2 99% Physical Exam  Constitutional: She is oriented to person, place, and time. She appears well-developed and well-nourished. No distress.  HENT:  Head: Normocephalic and atraumatic.  Cardiovascular: Regular rhythm and normal heart  sounds.   No murmur heard. Tachycardia  Pulmonary/Chest: Effort normal and breath sounds normal. No respiratory distress. She has no wheezes.  Abdominal: Soft. Bowel sounds are normal. There is no tenderness. There is no rebound.  Genitourinary:  CVA tenderness on the right  Neurological: She is alert and oriented to person, place, and time.  Skin: Skin is warm and dry.  Psychiatric: She has a normal mood and affect.  Nursing note and vitals reviewed.   ED Course  Procedures (including critical care time) Labs Review Labs Reviewed  COMPREHENSIVE METABOLIC PANEL - Abnormal; Notable for the following:    Glucose, Bld 101 (*)     Albumin 2.8 (*)    AST 13 (*)    ALT 8 (*)    All other components within normal limits  CBC - Abnormal; Notable for the following:    WBC 14.3 (*)    RBC 3.67 (*)    Hemoglobin 9.3 (*)    HCT 30.9 (*)    MCH 25.3 (*)    All other components within normal limits  URINALYSIS, ROUTINE W REFLEX MICROSCOPIC (NOT AT [facility]) - Abnormal; Notable for the following:    APPearance TURBID (*)    Hgb urine dipstick LARGE (*)    Protein, ur 100 (*)    Nitrite POSITIVE (*)    Leukocytes, UA LARGE (*)    All other components within normal limits  URINE MICROSCOPIC-ADD ON - Abnormal; Notable for the following:    Squamous Epithelial / LPF 0-5 (*)    Bacteria, UA FEW (*)    All other components within normal limits  URINE CULTURE  POC URINE PREG, ED    Imaging Review No results found. I have personally reviewed and evaluated these images and lab results as part of my medical decision-making.   EKG Interpretation None      MDM   Final diagnoses:  Pyelonephritis    Patient presents with right flank pain. Urinary symptoms and fever. Nontoxic on exam. Mildly tachycardic. Temperature 99.3. Some CVA tenderness. Laboratory notable for leukocytosis to 14 and urine appears infected. Patient was given IV Rocephin. Urine culture pending. Will obtain CT scan to rule out infected stone.    Merryl Hacker, MD 06/14/15 [phone]

## 2015-06-16 LAB — URINE CULTURE: Culture: >=100000 — AB

## 2015-06-17 ENCOUNTER — Telehealth (HOSPITAL_BASED_OUTPATIENT_CLINIC_OR_DEPARTMENT_OTHER): Payer: Self-pay | Admitting: Emergency Medicine

## 2015-06-17 NOTE — Telephone Encounter (Signed)
Post ED Visit - Positive Culture Follow-up  Culture report reviewed by antimicrobial stewardship pharmacist:  []  Elenor Quinones, Pharm.D. []  Heide Guile, Pharm.D., BCPS []  Parks Neptune, Pharm.D. [x]  Alycia Rossetti, Pharm.D., BCPS []  [place], Pharm.D., BCPS, AAHIVP []  Legrand Como, Pharm.D., BCPS, AAHIVP []  Milus Glazier, Pharm.D. []  Stephens November, [place].D.  Positive urine culture Treated with ciprofloxacin, organism sensitive to the same and no further patient follow-up is required at this time.  Hazle Nordmann 06/17/2015, 11:42 AM

## 2015-08-09 ENCOUNTER — Encounter (HOSPITAL_COMMUNITY): Payer: Self-pay | Admitting: Emergency Medicine

## 2015-08-09 ENCOUNTER — Emergency Department (HOSPITAL_COMMUNITY)
Admission: EM | Admit: 2015-08-09 | Discharge: 2015-08-09 | Disposition: A | Discharge status: Home / Self Care | Payer: Medicaid Other | Attending: Emergency Medicine | Admitting: Emergency Medicine

## 2015-08-09 ENCOUNTER — Emergency Department (HOSPITAL_COMMUNITY): Payer: Medicaid Other

## 2015-08-09 DIAGNOSIS — Y999 Unspecified external cause status: Secondary | ICD-10-CM | POA: Insufficient documentation

## 2015-08-09 DIAGNOSIS — S00511A Abrasion of lip, initial encounter: Secondary | ICD-10-CM | POA: Diagnosis not present

## 2015-08-09 DIAGNOSIS — S0993XA Unspecified injury of face, initial encounter: Secondary | ICD-10-CM | POA: Diagnosis present

## 2015-08-09 DIAGNOSIS — Y939 Activity, unspecified: Secondary | ICD-10-CM | POA: Insufficient documentation

## 2015-08-09 DIAGNOSIS — Y929 Unspecified place or not applicable: Secondary | ICD-10-CM | POA: Diagnosis not present

## 2015-08-09 DIAGNOSIS — M25561 Pain in right knee: Secondary | ICD-10-CM | POA: Diagnosis not present

## 2015-08-09 MED ORDER — NAPROXEN 250 MG PO TABS: 250.0000 mg | ORAL_TABLET | Freq: Two times a day (BID) | ORAL | Status: DC
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
## 2015-08-09 NOTE — ED Notes (Signed)
Pt in xray
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
## 2015-08-09 NOTE — ED Notes (Signed)
The pt went right to xray

## 2015-09-09 ENCOUNTER — Encounter (HOSPITAL_COMMUNITY): Payer: Self-pay | Admitting: Emergency Medicine

## 2015-09-09 DIAGNOSIS — N760 Acute vaginitis: Secondary | ICD-10-CM | POA: Insufficient documentation

## 2015-09-09 DIAGNOSIS — B9689 Other specified bacterial agents as the cause of diseases classified elsewhere: Secondary | ICD-10-CM | POA: Diagnosis not present

## 2015-09-09 DIAGNOSIS — N83209 Unspecified ovarian cyst, unspecified side: Secondary | ICD-10-CM | POA: Diagnosis not present

## 2015-09-09 DIAGNOSIS — R1030 Lower abdominal pain, unspecified: Secondary | ICD-10-CM | POA: Diagnosis present

## 2015-09-09 DIAGNOSIS — N83201 Unspecified ovarian cyst, right side: Secondary | ICD-10-CM | POA: Insufficient documentation

## 2015-09-09 NOTE — ED Triage Notes (Signed)
Pt. reports low abdominal pain with nausea / emesis onset this evening , denies diarrhea / no fever or chills.

## 2015-09-10 ENCOUNTER — Emergency Department (HOSPITAL_COMMUNITY): Payer: Medicaid Other

## 2015-09-10 ENCOUNTER — Emergency Department (HOSPITAL_COMMUNITY)
Admission: EM | Admit: 2015-09-10 | Discharge: 2015-09-10 | Disposition: A | Discharge status: Home / Self Care | Payer: Medicaid Other | Attending: Emergency Medicine | Admitting: Emergency Medicine

## 2015-09-10 DIAGNOSIS — B9689 Other specified bacterial agents as the cause of diseases classified elsewhere: Secondary | ICD-10-CM

## 2015-09-10 DIAGNOSIS — N760 Acute vaginitis: Secondary | ICD-10-CM

## 2015-09-10 DIAGNOSIS — R52 Pain, unspecified: Secondary | ICD-10-CM

## 2015-09-10 DIAGNOSIS — N83209 Unspecified ovarian cyst, unspecified side: Secondary | ICD-10-CM

## 2015-09-10 LAB — COMPREHENSIVE METABOLIC PANEL
ALBUMIN: 3.6 g/dL (ref 3.5–5.0)
ALK PHOS: 62 U/L (ref 38–126)
ALT: 14 U/L (ref 14–54)
ANION GAP: 6 (ref 5–15)
AST: 20 U/L (ref 15–41)
BUN: 10 mg/dL (ref 6–20)
CO2: 25 mmol/L (ref 22–32)
CREATININE: 0.77 mg/dL (ref 0.44–1.00)
Calcium: 9 mg/dL (ref 8.9–10.3)
Chloride: 108 mmol/L (ref 101–111)
GFR calc Af Amer: >60 mL/min (ref >60)
GFR calc non Af Amer: >60 mL/min (ref >60)
Glucose, Bld: 85 mg/dL (ref 65–99)
Potassium: 3.4 mmol/L — ABNORMAL LOW (ref 3.5–5.1)
SODIUM: 139 mmol/L (ref 135–145)
TOTAL PROTEIN: 8.3 g/dL — AB (ref 6.5–8.1)
Total Bilirubin: 0.3 mg/dL (ref 0.3–1.2)

## 2015-09-10 LAB — CBC
HCT: 30.4 % — ABNORMAL LOW (ref 36.0–46.0)
Hemoglobin: 9.3 g/dL — ABNORMAL LOW (ref 12.0–15.0)
MCH: 24.6 pg — ABNORMAL LOW (ref 26.0–34.0)
MCHC: 30.6 g/dL (ref 30.0–36.0)
MCV: 80.4 fL (ref 78.0–100.0)
PLATELETS: 152 10*3/uL (ref 150–400)
RBC: 3.78 MIL/uL — AB (ref 3.87–5.11)
RDW: 17.6 % — ABNORMAL HIGH (ref 11.5–15.5)
WBC: 8.4 10*3/uL (ref 4.0–10.5)

## 2015-09-10 LAB — URINE MICROSCOPIC-ADD ON

## 2015-09-10 LAB — WET PREP, GENITAL
Sperm: NONE SEEN
TRICH WET PREP: NONE SEEN
WBC, Wet Prep HPF POC: NONE SEEN
YEAST WET PREP: NONE SEEN

## 2015-09-10 LAB — URINALYSIS, ROUTINE W REFLEX MICROSCOPIC
BILIRUBIN URINE: NEGATIVE
Glucose, UA: NEGATIVE mg/dL
KETONES UR: 15 mg/dL — AB
Leukocytes, UA: NEGATIVE
NITRITE: NEGATIVE
SPECIFIC GRAVITY, URINE: 1.029 (ref 1.005–1.030)
pH: 6 (ref 5.0–8.0)

## 2015-09-10 LAB — LIPASE, BLOOD: Lipase: 26 U/L (ref 11–51)

## 2015-09-10 LAB — POC URINE PREG, ED: PREG TEST UR: NEGATIVE

## 2015-09-10 LAB — GC/CHLAMYDIA PROBE AMP (~~LOC~~) NOT AT ARMC
CHLAMYDIA, DNA PROBE: NEGATIVE
Neisseria Gonorrhea: NEGATIVE

## 2015-09-10 MED ORDER — ONDANSETRON 4 MG PO TBDP
4.0000 mg | ORAL_TABLET | Freq: Once | ORAL | Status: AC
  Administered 2015-09-10: 4 mg via ORAL
  Filled 2015-09-10: qty 1

## 2015-09-10 MED ORDER — ONDANSETRON HCL 4 MG PO TABS: 4.0000 mg | ORAL_TABLET | Freq: Four times a day (QID) | ORAL | 0 refills | Status: DC

## 2015-09-10 MED ORDER — OXYCODONE-ACETAMINOPHEN 5-325 MG PO TABS
1.0000 | ORAL_TABLET | Freq: Once | ORAL | Status: AC
  Administered 2015-09-10: 1 via ORAL
  Filled 2015-09-10: qty 1

## 2015-09-10 MED ORDER — METRONIDAZOLE 500 MG PO TABS: 500.0000 mg | ORAL_TABLET | Freq: Two times a day (BID) | ORAL | 0 refills | Status: DC

## 2015-09-10 MED ORDER — METRONIDAZOLE 500 MG PO TABS
500.0000 mg | ORAL_TABLET | Freq: Once | ORAL | Status: AC
  Administered 2015-09-10: 500 mg via ORAL
  Filled 2015-09-10: qty 1

## 2015-09-10 MED ORDER — METOCLOPRAMIDE HCL 10 MG PO TABS
10.0000 mg | ORAL_TABLET | Freq: Once | ORAL | Status: AC
  Administered 2015-09-10: 10 mg via ORAL
  Filled 2015-09-10: qty 1

## 2015-09-10 MED ORDER — HYDROCODONE-ACETAMINOPHEN 5-325 MG PO TABS: 1.0000 | ORAL_TABLET | ORAL | 0 refills | Status: DC | PRN

## 2015-09-10 NOTE — ED Provider Notes (Signed)
By signing my name below, I, Reola Mosher, attest that this documentation has been prepared under the direction and in the presence of Delos Haring, PA-C.  Electronically Signed: Reola Mosher, ED Scribe. 09/10/15. 1:29 AM.  TIME SEEN:  First MD Initiated Contact with Patient 09/10/15 0120    CHIEF COMPLAINT:  Chief Complaint  Patient presents with  . Abdominal Pain   HPI Comments: Christine Todd is a 40 y.o. female BIB EMS, with a PMHx of anemia, GERD, and SLE, who presents to the Emergency Department complaining of gradual onset, gradually worsening, constant, 5/10 right to middle lower abdominal pain onset yesterday PM PTA. Pt reports associated nausea and vomiting since the onset of her abdominal pain. She states that she has a hx of similar pain with a ruptured ovarian cyst, and her pain came on after sexual intercourse. LNMP: 08/17/15, and states that she had an episode of abnormal vaginal bleeding ~6 days later. She additionally had an episode of abnormal vaginal bleeding with associated milky white discharge last week. Pt has taken a dose of Ibuprofen ~5 hours PTA with minimal relief of her symptoms. Denies diarrhea, fever, chills, or any other associated symptoms.   ROS: See HPI Constitutional: no fever  Eyes: no drainage  ENT: no runny nose   Cardiovascular:  no chest pain  Resp: no SOB  GI:  vomiting GU: no dysuria Integumentary: no rash  Allergy: no hives  Musculoskeletal: no leg swelling  Neurological: no slurred speech ROS otherwise negative  PAST MEDICAL HISTORY/PAST SURGICAL HISTORY:  Past Medical History:  Diagnosis Date  . Anemia   . Arthritis    "hands and feet" (07/04/2014)  . GERD (gastroesophageal reflux disease)   . Headache    "weekly" (07/04/2014)  . History of blood transfusion 07/04/2014   "related to anemia"  . Pneumonia "several times"  . 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 SLE (systemic lupus erythematosus) ([facility])      MEDICATIONS:  Prior to Admission medications   Medication Sig Start Date End Date Taking? Authorizing Provider  acetaminophen (TYLENOL) 650 MG CR tablet Take 650 mg by mouth every 8 (eight) hours as needed for pain.     Historical Provider, MD  ciprofloxacin (CIPRO) 500 MG tablet Take 1 tablet (500 mg total) by mouth 2 (two) times daily. 06/14/15   Davonna Belling, MD  ferrous sulfate 325 (65 FE) MG tablet Take 1 tablet (325 mg total) by mouth 3 (three) times daily with meals. Patient taking differently: Take 325 mg by mouth every other day.  07/05/14   Maryann Mikhail, DO  HYDROcodone-acetaminophen (NORCO/VICODIN) 5-325 MG tablet Take 1-2 tablets by mouth every 4 (four) hours as needed. 06/14/15   Davonna Belling, MD  mycophenolate (CELLCEPT) 500 MG tablet Take 1,000 mg by mouth 2 (two) times a week.     Historical Provider, MD  naproxen (NAPROSYN) 250 MG tablet Take 1 tablet (250 mg total) by mouth 2 (two) times daily with a meal. 08/09/15   Waynetta Pean, PA-C  OVER THE COUNTER MEDICATION Take 1 Package by mouth daily. Vitamin b and biotin packet    Historical Provider, MD  predniSONE (DELTASONE) 20 MG tablet Take 25 mg by mouth daily.     Historical Provider, MD    ALLERGIES:  Allergies  Allergen Reactions  . Sulfa Antibiotics Itching  . Sulfonamide Derivatives     REACTION: rash, itching    SOCIAL HISTORY:  Social History  Substance  Use Topics  . Smoking status: Never Smoker  . Smokeless tobacco: Never Used  . Alcohol use Yes    FAMILY HISTORY: Family History  Problem Relation Age of Onset  . Diabetes Other   . Cancer Other   . Hypertension Other   . Diabetes Father     EXAM: BP 144/98 (BP Location: Right Arm)   Pulse 79   Temp 98.6 F (37 C) (Oral)   LMP 08/17/2015 (Approximate)  CONSTITUTIONAL: Alert and oriented and responds appropriately to questions. Well-appearing; well-nourished HEAD: Normocephalic EYES: Conjunctivae clear, PERRL ENT: normal nose; no  rhinorrhea; moist mucous membranes NECK: Supple, no meningismus, no LAD  CARD: RRR; S1 and S2 appreciated; no murmurs, no clicks, no rubs, no gallops RESP: Normal chest excursion without splinting or tachypnea; breath sounds clear and equal bilaterally; no wheezes, no rhonchi, no rales, no hypoxia or respiratory distress, speaking full sentences ABD/GI: Normal bowel sounds; non-distended; soft, tender diffusely about the abdomen, worse in the suprapubic area; however, no true RLQ tenderness, no rebound, no guarding, no peritoneal signs GU: white discharge in vaginal vault, right ovarian tenderness to palpation. No  Blood within vaginal vault, no CMT BACK:  The back appears normal and is non-tender to palpation, there is no CVA tenderness EXT: Normal ROM in all joints; non-tender to palpation; no edema; normal capillary refill; no cyanosis, no calf tenderness or swelling    SKIN: Normal color for age and race; warm; no rash NEURO: Moves all extremities equally, sensation to light touch intact diffusely, cranial nerves II through XII intact PSYCH: The patient's mood and manner are appropriate. Grooming and personal hygiene are appropriate.  MEDICAL DECISION MAKING: + clue cells Neg urine preg Lipase, CMP and CBC are not acutely abnormal. US shows right hemorrhagic ovarian cyst rupture- otherwise no other abnormality. Will start on Flagyl and treat pain.She can f/u with her primary care doctor or OB, but she has been referred to [facility] outpatient clinic here from the ER.   I personally performed the services described in this documentation, which was scribed in my presence. The recorded information has been reviewed and is accurate.    Delos Haring, PA-C 09/10/15 [place], DO 09/10/15 [id number]

## 2015-09-10 NOTE — ED Notes (Signed)
Pt understood dc material. NAD Noted. Scripts and paperwork reviewed at [employer]

## 2016-01-16 ENCOUNTER — Encounter (HOSPITAL_COMMUNITY): Payer: Self-pay | Admitting: Emergency Medicine

## 2016-01-16 ENCOUNTER — Ambulatory Visit (HOSPITAL_COMMUNITY)
Admission: EM | Admit: 2016-01-16 | Discharge: 2016-01-16 | Disposition: A | Discharge status: Home / Self Care | Payer: Medicaid Other | Attending: Emergency Medicine | Admitting: Emergency Medicine

## 2016-01-16 DIAGNOSIS — K047 Periapical abscess without sinus: Secondary | ICD-10-CM

## 2016-01-16 MED ORDER — HYDROCODONE-ACETAMINOPHEN 5-325 MG PO TABS: 1.0000 | ORAL_TABLET | Freq: Four times a day (QID) | ORAL | 0 refills | Status: DC | PRN

## 2016-01-16 MED ORDER — AMOXICILLIN 500 MG PO CAPS: 1000.0000 mg | ORAL_CAPSULE | Freq: Two times a day (BID) | ORAL | 0 refills | Status: DC

## 2016-01-16 MED ORDER — IBUPROFEN 600 MG PO TABS: 600.0000 mg | ORAL_TABLET | Freq: Four times a day (QID) | ORAL | 0 refills | Status: DC | PRN

## 2016-01-16 NOTE — Discharge Instructions (Signed)
You have an infection around that tooth. Take amoxicillin twice a day for 10 days. Take ibuprofen 600 mg every 6 hours for the next 2-3 days. Use the hydrocodone every 4-6 hours as needed for severe pain. Do not drive while taking this medicine. That tooth will have to come out. Please follow-up with a dentist as soon as you can.

## 2016-01-16 NOTE — ED Provider Notes (Signed)
[place]    CSN: [id number] Arrival date & time: 01/16/16  1106     History   Chief Complaint Chief Complaint  Patient presents with  . Dental Pain    HPI Christine Todd is a 40 y.o. female.   HPI  She is a 40 year old woman here for evaluation of dental pain. Pain started 2 days ago. Yesterday, she developed swelling. This involves the right lower jaw. She has had problems with that tooth in the past, but will typically resolve with some mouthwash. No fevers or difficulty swallowing. She does not have a Pharmacist, community.  Past Medical History:  Diagnosis Date  . Anemia   . Arthritis    "hands and feet" (07/04/2014)  . GERD (gastroesophageal reflux disease)   . Headache    "weekly" (07/04/2014)  . History of blood transfusion 07/04/2014   "related to anemia"  . Pneumonia "several times"  . 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 SLE (systemic lupus erythematosus) ([place])     Patient Active Problem List   Diagnosis Date Noted  . Symptomatic anemia 07/04/2014  . Benign essential HTN 07/04/2014  . Hypokalemia 07/04/2014  . FOOT PAIN, LEFT 12/10/2009  . KNEE PAIN 11/13/2008  . SKIN RASH 11/13/2008  . UTI 11/09/2008  . Iron deficiency anemia 11/01/2008  . MICROSCOPIC HEMATURIA 11/01/2008  . INSOMNIA 07/31/2008  . HEADACHE 07/31/2008  . ABSCESS, SKIN 04/27/2008  . FREQUENCY, URINARY 03/01/2008  . OVARIAN CYST, RUPTURED 11/08/2007  . GERD 09/24/2007  . SLE 11/25/2006  . CANDIDIASIS 11/18/2006  . SORE THROAT 11/18/2006  . ABNORMAL RESULT, FUNCTION STUDY, LIVER 11/18/2006    Past Surgical History:  Procedure Laterality Date  . DILATION AND CURETTAGE OF UTERUS  2009  . RENAL BIOPSY, PERCUTANEOUS  ?2013    OB History    No data available       Home Medications    Prior to Admission medications   Medication Sig Start Date End Date Taking? Authorizing Provider  acetaminophen (TYLENOL) 650 MG CR tablet Take 650 mg by mouth every 8 (eight) hours as  needed for pain.    Yes Historical Provider, MD  ferrous sulfate 325 (65 FE) MG tablet Take 1 tablet (325 mg total) by mouth 3 (three) times daily with meals. Patient taking differently: Take 325 mg by mouth every other day.  07/05/14  Yes Maryann Mikhail, DO  HYDROcodone-acetaminophen (NORCO/VICODIN) 5-325 MG tablet Take 1-2 tablets by mouth every 4 (four) hours as needed. 06/14/15  Yes Davonna Belling, MD  amoxicillin (AMOXIL) 500 MG capsule Take 2 capsules (1,000 mg total) by mouth 2 (two) times daily. 01/16/16   Melony Overly, MD  HYDROcodone-acetaminophen (NORCO) 5-325 MG tablet Take 1 tablet by mouth every 6 (six) hours as needed for moderate pain. 01/16/16   Melony Overly, MD  ibuprofen (ADVIL,MOTRIN) 600 MG tablet Take 1 tablet (600 mg total) by mouth every 6 (six) hours as needed for moderate pain. 01/16/16   Melony Overly, MD  ondansetron (ZOFRAN) 4 MG tablet Take 1 tablet (4 mg total) by mouth every 6 (six) hours. 09/10/15   Tiffany Carlota Raspberry, PA-C  OVER THE COUNTER MEDICATION Take 1 Package by mouth daily. Vitamin b and biotin packet    Historical Provider, MD    Family History Family History  Problem Relation Age of Onset  . Diabetes Other   . Cancer Other   . Hypertension Other   . Diabetes Father  Social History Social History  Substance Use Topics  . Smoking status: Never Smoker  . Smokeless tobacco: Never Used  . Alcohol use Yes     Allergies   Sulfa antibiotics and Sulfonamide derivatives   Review of Systems Review of Systems As in history of present illness  Physical Exam Triage Vital Signs ED Triage Vitals  Enc Vitals Group     BP 01/16/16 1145 143/84     Pulse Rate 01/16/16 1145 87     Resp 01/16/16 1145 12     Temp 01/16/16 1145 98.7 F (37.1 C)     Temp Source 01/16/16 1145 Oral     SpO2 01/16/16 1145 98 %     Weight --      Height --      Head Circumference --      Peak Flow --      Pain Score 01/16/16 1200 2     Pain Loc --      Pain Edu? --       Excl. in [place]? --    No data found.   Updated Vital Signs BP 143/84 (BP Location: Right Arm)   Pulse 87   Temp 98.7 F (37.1 C) (Oral)   Resp 12   SpO2 98%   Visual Acuity Right Eye Distance:   Left Eye Distance:   Bilateral Distance:    Right Eye Near:   Left Eye Near:    Bilateral Near:     Physical Exam  Constitutional: She is oriented to person, place, and time. She appears well-developed and well-nourished. No distress.  HENT:  Mouth/Throat:    Swelling of the right lower jaw  Cardiovascular: Normal rate.   Pulmonary/Chest: Effort normal.  Neurological: She is alert and oriented to person, place, and time.     UC Treatments / Results  Labs (all labs ordered are listed, but only abnormal results are displayed) Labs Reviewed - No data to display  EKG  EKG Interpretation None       Radiology No results found.  Procedures Procedures (including critical care time)  Medications Ordered in UC Medications - No data to display   Initial Impression / Assessment and Plan / UC Course  I have reviewed the triage vital signs and the nursing notes.  Pertinent labs & imaging results that were available during my care of the patient were reviewed by me and considered in my medical decision making (see chart for details).  Clinical Course     Treat with amoxicillin. Ibuprofen and Vicodin as needed for pain. Dental resource handout given.  Final Clinical Impressions(s) / UC Diagnoses   Final diagnoses:  Dental abscess    New Prescriptions New Prescriptions   AMOXICILLIN (AMOXIL) 500 MG CAPSULE    Take 2 capsules (1,000 mg total) by mouth 2 (two) times daily.   HYDROCODONE-ACETAMINOPHEN (NORCO) 5-325 MG TABLET    Take 1 tablet by mouth every 6 (six) hours as needed for moderate pain.   IBUPROFEN (ADVIL,MOTRIN) 600 MG TABLET    Take 1 tablet (600 mg total) by mouth every 6 (six) hours as needed for moderate pain.     Melony Overly, MD 01/16/16  1210

## 2016-01-16 NOTE — ED Triage Notes (Signed)
Here for right lower dental pain onset 3 days associated w/facial swelling  Pain is 2/10... Taking left over Vicodin w/temp relief.   Denies fevers  A&O x4... NAD

## 2016-12-24 ENCOUNTER — Encounter (HOSPITAL_COMMUNITY): Payer: Self-pay

## 2016-12-24 ENCOUNTER — Other Ambulatory Visit: Payer: Self-pay

## 2016-12-24 ENCOUNTER — Emergency Department (HOSPITAL_COMMUNITY)
Admission: EM | Admit: 2016-12-24 | Discharge: 2016-12-24 | Disposition: A | Discharge status: Home / Self Care | Payer: Medicaid Other | Attending: Emergency Medicine | Admitting: Emergency Medicine

## 2016-12-24 DIAGNOSIS — I10 Essential (primary) hypertension: Secondary | ICD-10-CM | POA: Insufficient documentation

## 2016-12-24 DIAGNOSIS — M545 Low back pain, unspecified: Secondary | ICD-10-CM

## 2016-12-24 DIAGNOSIS — Z79899 Other long term (current) drug therapy: Secondary | ICD-10-CM | POA: Insufficient documentation

## 2016-12-24 LAB — POC URINE PREG, ED: Preg Test, Ur: NEGATIVE

## 2016-12-24 LAB — URINALYSIS, ROUTINE W REFLEX MICROSCOPIC
Bilirubin Urine: NEGATIVE
GLUCOSE, UA: NEGATIVE mg/dL
Ketones, ur: NEGATIVE mg/dL
Leukocytes, UA: NEGATIVE
Nitrite: NEGATIVE
PROTEIN: 100 mg/dL — AB
Specific Gravity, Urine: 1.021 (ref 1.005–1.030)
pH: 6 (ref 5.0–8.0)

## 2016-12-24 MED ORDER — KETOROLAC TROMETHAMINE 30 MG/ML IJ SOLN
30.0000 mg | Freq: Once | INTRAMUSCULAR | Status: AC
  Administered 2016-12-24: 30 mg via INTRAMUSCULAR
  Filled 2016-12-24: qty 1

## 2016-12-24 MED ORDER — NAPROXEN 500 MG PO TABS: 500.0000 mg | ORAL_TABLET | Freq: Two times a day (BID) | ORAL | 0 refills | Status: DC

## 2016-12-24 MED ORDER — METHOCARBAMOL 500 MG PO TABS: 500.0000 mg | ORAL_TABLET | Freq: Four times a day (QID) | ORAL | 0 refills | Status: DC

## 2016-12-24 NOTE — ED Notes (Signed)
ED Provider at bedside. 

## 2016-12-24 NOTE — Discharge Instructions (Signed)
Please read and follow all provided instructions.  Your diagnoses today include:  1. Acute right-sided low back pain without sciatica     Tests performed today include:  Vital signs - see below for your results today  Urine test -no infection or pregnancy  Medications prescribed:   Robaxin (methocarbamol) - muscle relaxer medication  DO NOT drive or perform any activities that require you to be awake and alert because this medicine can make you drowsy.    Naproxen - anti-inflammatory pain medication  Do not exceed 500mg  naproxen every 12 hours, take with food  You have been prescribed an anti-inflammatory medication or NSAID. Take with food. Take smallest effective dose for the shortest duration needed for your pain. Stop taking if you experience stomach pain or vomiting.   Take any prescribed medications only as directed.  Home care instructions:   Follow any educational materials contained in this packet  Please rest, use ice or heat on your back for the next several days  Do not lift, push, pull anything more than 10 pounds for the next week  Follow-up instructions: Please follow-up with your primary care provider in the next 2 week for further evaluation of your symptoms if not improving.   Return instructions:  SEEK IMMEDIATE MEDICAL ATTENTION IF YOU HAVE:  New numbness, tingling, weakness, or problem with the use of your arms or legs  Severe back pain not relieved with medications  Loss control of your bowels or bladder  Increasing pain in any areas of the body (such as chest or abdominal pain)  Shortness of breath, dizziness, or fainting.   Worsening nausea (feeling sick to your stomach), vomiting, fever, or sweats  Any other emergent concerns regarding your health   Additional Information:  Your vital signs today were: BP (!) 142/84    Pulse 71    Temp 98.1 F (36.7 C) (Oral)    Resp 16    SpO2 100%  If your blood pressure (BP) was elevated above  135/85 this visit, please have this repeated by your doctor within one month. --------------

## 2016-12-24 NOTE — ED Provider Notes (Signed)
[facility] EMERGENCY DEPARTMENT Provider Note   CSN: [id number] Arrival date & time: 12/24/16  2703     History   Chief Complaint Chief Complaint  Patient presents with  . Back Pain    HPI Christine Todd is a 41 y.o. female.  Patient presents with complaints of right lower back pain ongoing over the past 3 weeks.  At first, symptoms were intermittent but have been more constant over the past week and half.  No injury at onset.  Pain is not over her spine.  It is improved with palpation and pressure.  It is worse when she sits for a long time.  Pain does not radiate.  No chest pain or abdominal pain.  No dysuria or hematuria.  No significant vaginal bleeding or discharge.  Patient states that she had two episodes of vaginal bleeding in October.  She has not yet had her period and wonders if she could be pregnant.  She has been taking ibuprofen at home with relief, but has run out. Patient denies warning symptoms of back pain including: fecal incontinence, urinary retention or overflow incontinence, night sweats, waking from sleep with back pain, unexplained fevers or weight loss, h/o cancer, IVDU, recent trauma.         Past Medical History:  Diagnosis Date  . Anemia   . Arthritis    "hands and feet" (07/04/2014)  . GERD (gastroesophageal reflux disease)   . Headache    "weekly" (07/04/2014)  . History of blood transfusion 07/04/2014   "related to anemia"  . Pneumonia "several times"  . 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 SLE (systemic lupus erythematosus) ([place])     Patient Active Problem List   Diagnosis Date Noted  . Symptomatic anemia 07/04/2014  . Benign essential HTN 07/04/2014  . Hypokalemia 07/04/2014  . FOOT PAIN, LEFT 12/10/2009  . KNEE PAIN 11/13/2008  . SKIN RASH 11/13/2008  . UTI 11/09/2008  . Iron deficiency anemia 11/01/2008  . MICROSCOPIC HEMATURIA 11/01/2008  . INSOMNIA 07/31/2008  . HEADACHE 07/31/2008  . ABSCESS, SKIN  04/27/2008  . FREQUENCY, URINARY 03/01/2008  . OVARIAN CYST, RUPTURED 11/08/2007  . GERD 09/24/2007  . SLE 11/25/2006  . CANDIDIASIS 11/18/2006  . SORE THROAT 11/18/2006  . ABNORMAL RESULT, FUNCTION STUDY, LIVER 11/18/2006    Past Surgical History:  Procedure Laterality Date  . DILATION AND CURETTAGE OF UTERUS  2009  . RENAL BIOPSY, PERCUTANEOUS  ?2013    OB History    No data available       Home Medications    Prior to Admission medications   Medication Sig Start Date End Date Taking? Authorizing Provider  acetaminophen (TYLENOL) 650 MG CR tablet Take 650 mg by mouth every 8 (eight) hours as needed for pain.     [provider]  amoxicillin (AMOXIL) 500 MG capsule Take 2 capsules (1,000 mg total) by mouth 2 (two) times daily. 01/16/16   Melony Overly, MD  ferrous sulfate 325 (65 FE) MG tablet Take 1 tablet (325 mg total) by mouth 3 (three) times daily with meals. Patient taking differently: Take 325 mg by mouth every other day.  07/05/14   Mikhail, Velta Addison, DO  HYDROcodone-acetaminophen (NORCO) 5-325 MG tablet Take 1 tablet by mouth every 6 (six) hours as needed for moderate pain. 01/16/16   Melony Overly, MD  HYDROcodone-acetaminophen (NORCO/VICODIN) 5-325 MG tablet Take 1-2 tablets by mouth every 4 (four) hours as needed.  06/14/15   Davonna Belling, MD  ibuprofen (ADVIL,MOTRIN) 600 MG tablet Take 1 tablet (600 mg total) by mouth every 6 (six) hours as needed for moderate pain. 01/16/16   Melony Overly, MD  ondansetron (ZOFRAN) 4 MG tablet Take 1 tablet (4 mg total) by mouth every 6 (six) hours. 09/10/15   Delos Haring, PA-C  OVER THE COUNTER MEDICATION Take 1 Package by mouth daily. Vitamin b and biotin packet    [provider]    Family History Family History  Problem Relation Age of Onset  . Diabetes Other   . Cancer Other   . Hypertension Other   . Diabetes Father     Social History Social History   Tobacco Use  . Smoking status: Never  Smoker  . Smokeless tobacco: Never Used  Substance Use Topics  . Alcohol use: Yes  . Drug use: No     Allergies   Sulfa antibiotics and Sulfonamide derivatives   Review of Systems Review of Systems  Constitutional: Negative for fever and unexpected weight change.  Gastrointestinal: Negative for constipation.       Negative for fecal incontinence.   Genitourinary: Negative for dysuria, flank pain, hematuria, pelvic pain, vaginal bleeding and vaginal discharge.       Negative for urinary incontinence or retention.  Musculoskeletal: Positive for back pain.  Neurological: Negative for weakness and numbness.       Denies saddle paresthesias.     Physical Exam Updated Vital Signs BP (!) 137/105 (BP Location: Right Arm)   Pulse 81   Temp 98.1 F (36.7 C) (Oral)   Resp 18   SpO2 100%   Physical Exam  Constitutional: She appears well-developed and well-nourished.  HENT:  Head: Normocephalic and atraumatic.  Eyes: Conjunctivae are normal.  Neck: Normal range of motion. Neck supple.  Pulmonary/Chest: Effort normal.  Abdominal: Soft. There is no tenderness. There is no CVA tenderness.  Musculoskeletal:       Cervical back: She exhibits normal range of motion and no tenderness.       Thoracic back: She exhibits normal range of motion, no tenderness and no bony tenderness.       Lumbar back: She exhibits tenderness. She exhibits normal range of motion and no bony tenderness.       Back:  No step-off noted with palpation of spine.   Neurological: She is alert. She has normal strength and normal reflexes. No sensory deficit.  5/5 strength in entire lower extremities bilaterally. No sensation deficit.   Skin: Skin is warm and dry. No rash noted.  Psychiatric: She has a normal mood and affect.  Nursing note and vitals reviewed.    ED Treatments / Results  Labs (all labs ordered are listed, but only abnormal results are displayed) Labs Reviewed  URINALYSIS, ROUTINE W REFLEX  MICROSCOPIC - Abnormal; Notable for the following components:      Result Value   Hgb urine dipstick SMALL (*)    Protein, ur 100 (*)    Bacteria, UA FEW (*)    Squamous Epithelial / LPF 0-5 (*)    All other components within normal limits  POC URINE PREG, ED    EKG  EKG Interpretation None       Radiology No results found.  Procedures Procedures (including critical care time)  Medications Ordered in ED Medications - No data to display   Initial Impression / Assessment and Plan / ED Course  I have reviewed the triage vital signs  and the nursing notes.  Pertinent labs & imaging results that were available during my care of the patient were reviewed by me and considered in my medical decision making (see chart for details).     Patient seen and examined. Work-up initiated.   Vital signs reviewed and are as follows: BP (!) 147/96 (BP Location: Right Arm)   Pulse 67   Temp 98.1 F (36.7 C) (Oral)   Resp 16   SpO2 100%   No red flag s/s of low back pain. Patient was counseled on back pain precautions and told to do activity as tolerated but do not lift, push, or pull heavy objects more than 10 pounds for the next week.  Patient counseled to use ice or heat on back for no longer than 15 minutes every hour.   Patient counseled on proper use of muscle relaxant medication.  They were told not to drink alcohol, drive any vehicle, or do any dangerous activities while taking this medication.  Patient verbalized understanding.  Patient urged to follow-up with PCP if pain does not improve with treatment and rest or if pain becomes recurrent. Urged to return with worsening severe pain, loss of bowel or bladder control, trouble walking.   The patient verbalizes understanding and agrees with the plan.   Final Clinical Impressions(s) / ED Diagnoses   Final diagnoses:  Acute right-sided low back pain without sciatica   Patient with back pain. Urine negative. No neurological  deficits. Patient is ambulatory. No warning symptoms of back pain including: fecal incontinence, urinary retention or overflow incontinence, night sweats, waking from sleep with back pain, unexplained fevers or weight loss, h/o cancer, IVDU, recent trauma. No concern for cauda equina, epidural abscess, or other serious cause of back pain. Conservative measures such as rest, ice/heat and pain medicine indicated with PCP follow-up if no improvement with conservative management. Encouraged f/u in 2 weeks if no improvement.    ED Discharge Orders        Ordered    naproxen (NAPROSYN) 500 MG tablet  2 times daily     12/24/16 0903    methocarbamol (ROBAXIN) 500 MG tablet  4 times daily     12/24/16 0903        Carlisle Cater, PA-C 12/24/16 0908    Carmin Muskrat, MD 12/24/16 1420

## 2016-12-24 NOTE — ED Triage Notes (Signed)
Patient complains of 3 weeks of right sided lower back pain with ROM, denies injury. Has been taking ibuprofen with some relief. NAD

## 2017-01-27 ENCOUNTER — Other Ambulatory Visit: Payer: Self-pay

## 2017-01-27 ENCOUNTER — Emergency Department (HOSPITAL_COMMUNITY)
Admission: EM | Admit: 2017-01-27 | Discharge: 2017-01-28 | Disposition: A | Discharge status: Home / Self Care | Payer: Medicaid Other | Attending: Physician Assistant | Admitting: Physician Assistant

## 2017-01-27 ENCOUNTER — Encounter (HOSPITAL_COMMUNITY): Payer: Self-pay | Admitting: Emergency Medicine

## 2017-01-27 ENCOUNTER — Ambulatory Visit (HOSPITAL_COMMUNITY)
Admission: EM | Admit: 2017-01-27 | Discharge: 2017-01-27 | Disposition: A | Discharge status: Home / Self Care | Payer: Medicaid Other | Attending: Family Medicine | Admitting: Family Medicine

## 2017-01-27 ENCOUNTER — Emergency Department (HOSPITAL_COMMUNITY): Payer: Medicaid Other

## 2017-01-27 ENCOUNTER — Encounter (HOSPITAL_COMMUNITY): Payer: Self-pay | Admitting: *Deleted

## 2017-01-27 ENCOUNTER — Ambulatory Visit (INDEPENDENT_AMBULATORY_CARE_PROVIDER_SITE_OTHER): Payer: Medicaid Other

## 2017-01-27 DIAGNOSIS — R0789 Other chest pain: Secondary | ICD-10-CM

## 2017-01-27 DIAGNOSIS — R0602 Shortness of breath: Secondary | ICD-10-CM

## 2017-01-27 DIAGNOSIS — I10 Essential (primary) hypertension: Secondary | ICD-10-CM | POA: Diagnosis not present

## 2017-01-27 DIAGNOSIS — Z79899 Other long term (current) drug therapy: Secondary | ICD-10-CM | POA: Insufficient documentation

## 2017-01-27 DIAGNOSIS — R079 Chest pain, unspecified: Secondary | ICD-10-CM | POA: Diagnosis present

## 2017-01-27 LAB — CBC
HCT: 30.5 % — ABNORMAL LOW (ref 36.0–46.0)
HEMOGLOBIN: 9.5 g/dL — AB (ref 12.0–15.0)
MCH: 25.8 pg — AB (ref 26.0–34.0)
MCHC: 31.1 g/dL (ref 30.0–36.0)
MCV: 82.9 fL (ref 78.0–100.0)
PLATELETS: 214 10*3/uL (ref 150–400)
RBC: 3.68 MIL/uL — ABNORMAL LOW (ref 3.87–5.11)
RDW: 17 % — AB (ref 11.5–15.5)
WBC: 5 10*3/uL (ref 4.0–10.5)

## 2017-01-27 LAB — BASIC METABOLIC PANEL
Anion gap: 7 (ref 5–15)
BUN: 10 mg/dL (ref 6–20)
CALCIUM: 8.4 mg/dL — AB (ref 8.9–10.3)
CO2: 25 mmol/L (ref 22–32)
CREATININE: 0.92 mg/dL (ref 0.44–1.00)
Chloride: 103 mmol/L (ref 101–111)
GFR calc Af Amer: >60 mL/min (ref >60)
GLUCOSE: 88 mg/dL (ref 65–99)
Potassium: 3.3 mmol/L — ABNORMAL LOW (ref 3.5–5.1)
Sodium: 135 mmol/L (ref 135–145)

## 2017-01-27 LAB — I-STAT TROPONIN, ED: TROPONIN I, POC: 0 ng/mL (ref 0.00–0.08)

## 2017-01-27 LAB — I-STAT BETA HCG BLOOD, ED (MC, WL, AP ONLY): I-stat hCG, quantitative: <5 m[IU]/mL (ref <5)

## 2017-01-27 MED ORDER — KETOROLAC TROMETHAMINE 30 MG/ML IJ SOLN: 30.0000 mg | Freq: Once | INTRAMUSCULAR | Status: DC

## 2017-01-27 MED ORDER — PREDNISONE 10 MG (21) PO TBPK: ORAL_TABLET | Freq: Every day | ORAL | 0 refills | Status: DC

## 2017-01-27 MED ORDER — IOPAMIDOL (ISOVUE-370) INJECTION 76%
100.0000 mL | Freq: Once | INTRAVENOUS | Status: AC | PRN
  Administered 2017-01-27: 100 mL via INTRAVENOUS

## 2017-01-27 MED ORDER — AZITHROMYCIN 250 MG PO TABS: 250.0000 mg | ORAL_TABLET | Freq: Every day | ORAL | 0 refills | Status: DC

## 2017-01-27 MED ORDER — KETOROLAC TROMETHAMINE 30 MG/ML IJ SOLN
30.0000 mg | Freq: Once | INTRAMUSCULAR | Status: AC
  Administered 2017-01-27: 30 mg via INTRAVENOUS
  Filled 2017-01-27: qty 1

## 2017-01-27 NOTE — ED Provider Notes (Signed)
[place]   [id number] 01/27/17 Arrival Time: 6967  ASSESSMENT & PLAN:  1. Shortness of breath   2. Atypical chest pain     Meds ordered this encounter  Medications  . DISCONTD: ketorolac (TORADOL) 30 MG/ML injection 30 mg  . azithromycin (ZITHROMAX) 250 MG tablet    Sig: Take 1 tablet (250 mg total) by mouth daily. Take first 2 tablets together, then 1 every day until finished.    Dispense:  6 tablet    Refill:  0  . predniSONE (STERAPRED UNI-PAK 21 TAB) 10 MG (21) TBPK tablet    Sig: Take by mouth daily. Take as directed.    Dispense:  21 tablet    Refill:  0   Unsure whether this is primarily lupus-related. Based on radiology report will cover her with course of azithromycin. Will also use a short course of prednisone. Toradol given in office for chest discomfort. No acute or new changes on ECG seen this evening when compared to previous ECGs.  Reviewed expectations re: course of current medical issues. Questions answered. Outlined signs and symptoms indicating need for more acute intervention. Patient verbalized understanding. After Visit Summary given.  After our discussion, [name] has decided that she prefers to be evaluated in the Emergency Department and will go there now.   SUBJECTIVE:  Christine Todd is a 41 y.o. female who presents with complaint of chest discomfort in center of her chest; some L-sided. Gradual onset a few days ago. Occasional SOB. No n/v. Ambulatory. Reports having to sleep upright because pain is worse when supine. Overall worse at night. Worse when bending over. No new medications. No new illnesses. OTC treatment: None.  ROS: As per HPI.   OBJECTIVE:  Vitals:   01/27/17 1639  BP: (!) 148/102  Pulse: 91  Resp: (!) 30  Temp: 99.4 F (37.4 C)  TempSrc: Oral  SpO2: 100%    Recheck RR: 22 General appearance: alert; no distress; appears anxious Eyes: PERRLA; EOMI; conjunctiva normal HENT: normocephalic; atraumatic Neck:  supple Lungs: clear to auscultation bilaterally Heart: regular rate and rhythm Chest Wall: non-tender Abdomen: soft, non-tender; bowel sounds normal; no masses or organomegaly; no guarding or rebound tenderness Extremities: no cyanosis or edema; symmetrical with no gross deformities Skin: warm and dry Psychological: alert and cooperative; normal mood and affect  Imaging: Dg Chest 2 View  Result Date: 01/27/2017 CLINICAL DATA:  Anterior chest pain radiating to LEFT chest for 5 days. LEFT hand numbness and chills. History of lupus. EXAM: CHEST  2 VIEW COMPARISON:  Chest radiograph July 04, 2014 FINDINGS: Cardiac silhouette is moderately enlarged. Mild bronchitic changes. Patchy bibasilar airspace opacities similar to prior radiograph. No pleural effusion. No pneumothorax. Soft tissue planes and included osseous structures are nonsuspicious. IMPRESSION: Chronic bibasilar airspace opacities though, there could be an acute component. Mild bronchitic changes. Slightly increased moderate cardiomegaly. Electronically Signed   By: Elon Alas M.D.   On: 01/27/2017 18:17    Allergies  Allergen Reactions  . Sulfa Antibiotics Itching  . Sulfonamide Derivatives     REACTION: rash, itching    Past Medical History:  Diagnosis Date  . Anemia   . Arthritis    "hands and feet" (07/04/2014)  . GERD (gastroesophageal reflux disease)   . Headache    "weekly" (07/04/2014)  . History of blood transfusion 07/04/2014   "related to anemia"  . Pneumonia "several times"  . 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  SLE (systemic lupus erythematosus) ([facility])    Social History   Socioeconomic History  . Marital status: Single    Spouse name: Not on file  . Number of children: Not on file  . Years of education: Not on file  . Highest education level: Not on file  Social Needs  . Financial resource strain: Not on file  . Food insecurity - worry: Not on file  . Food insecurity - inability: Not on  file  . Transportation needs - medical: Not on file  . Transportation needs - non-medical: Not on file  Occupational History  . Not on file  Tobacco Use  . Smoking status: Never Smoker  . Smokeless tobacco: Never Used  Substance and Sexual Activity  . Alcohol use: Yes  . Drug use: No  . Sexual activity: Not on file  Other Topics Concern  . Not on file  Social History Narrative  . Not on file   Family History  Problem Relation Age of Onset  . Diabetes Other   . Cancer Other   . Hypertension Other   . Diabetes Father    Past Surgical History:  Procedure Laterality Date  . DILATION AND CURETTAGE OF UTERUS  2009  . RENAL BIOPSY, PERCUTANEOUS  ?2013     Vanessa Kick, MD 02/01/17 1003

## 2017-01-27 NOTE — ED Notes (Signed)
Initial ekg completed and shown to dr. Thomasene Lot, request second EKG in 15 minutes.

## 2017-01-27 NOTE — ED Triage Notes (Addendum)
Pt reports centralized chest pain that radiates to the left chest, and into her left neck.  She also reports left hand numbness.  Pt is SOB.   She states she has to sleep sitting upright because if she is lying down the pain intensifies.  She also reports the pain gets worse with bending over and doing light activities & ADL's.

## 2017-01-27 NOTE — ED Notes (Signed)
Second EKG shown to Dr. Thomasene Lot, no changes at this time

## 2017-01-27 NOTE — Discharge Instructions (Signed)
Please return here or to the Emergency Department immediately should you feel worse in any way or have any of the following symptoms: increasing or different chest pain, pain that spreads to your arm, neck, jaw, back or abdomen, worsening shortness of breath, or nausea and vomiting. Please follow up for a recheck in 24 hours in order to ensure that you are not developing a problem that might require hospitalization.

## 2017-01-27 NOTE — ED Notes (Signed)
Patient transported to X-ray 

## 2017-01-27 NOTE — ED Triage Notes (Signed)
Pt reports CP and SOB for 5 days. Pain in the central chest that is "constant, worse with lying down, bending over and walking". When pain is worse, goes to her back at times.  No meds PTA. Pt was seen at Urgent Care prior.

## 2017-01-28 MED ORDER — INDOMETHACIN 25 MG PO CAPS: 25.0000 mg | ORAL_CAPSULE | Freq: Three times a day (TID) | ORAL | 0 refills | Status: AC | PRN

## 2017-01-28 MED ORDER — COLCHICINE 0.6 MG PO TABS: 0.6000 mg | ORAL_TABLET | Freq: Two times a day (BID) | ORAL | 0 refills | Status: DC

## 2017-01-28 NOTE — ED Provider Notes (Signed)
[place] EMERGENCY DEPARTMENT Provider Note   CSN: [id number] Arrival date & time: 01/27/17  1902     History   Chief Complaint Chief Complaint  Patient presents with  . Chest Pain    HPI Christine Todd is a 41 y.o. female.  HPI Patient presents to the emergency department with chest pain that is been ongoing for the last 5 days.  Patient is also had some shortness of breath as well.  Patient states the pain has been constant since that time and does not seem to be affected by positional changes she has had some cough as well.  The patient denies headache,blurred vision, neck pain, fever,  weakness, numbness, dizziness, anorexia, edema, abdominal pain, nausea, vomiting, diarrhea, rash, back pain, dysuria, hematemesis, bloody stool, near syncope, or syncope. Past Medical History:  Diagnosis Date  . Anemia   . Arthritis    "hands and feet" (07/04/2014)  . GERD (gastroesophageal reflux disease)   . Headache    "weekly" (07/04/2014)  . History of blood transfusion 07/04/2014   "related to anemia"  . Pneumonia "several times"  . 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 SLE (systemic lupus erythematosus) ([place])     Patient Active Problem List   Diagnosis Date Noted  . Symptomatic anemia 07/04/2014  . Benign essential HTN 07/04/2014  . Hypokalemia 07/04/2014  . FOOT PAIN, LEFT 12/10/2009  . KNEE PAIN 11/13/2008  . SKIN RASH 11/13/2008  . UTI 11/09/2008  . Iron deficiency anemia 11/01/2008  . MICROSCOPIC HEMATURIA 11/01/2008  . INSOMNIA 07/31/2008  . HEADACHE 07/31/2008  . ABSCESS, SKIN 04/27/2008  . FREQUENCY, URINARY 03/01/2008  . OVARIAN CYST, RUPTURED 11/08/2007  . GERD 09/24/2007  . SLE 11/25/2006  . CANDIDIASIS 11/18/2006  . SORE THROAT 11/18/2006  . ABNORMAL RESULT, FUNCTION STUDY, LIVER 11/18/2006    Past Surgical History:  Procedure Laterality Date  . DILATION AND CURETTAGE OF UTERUS  2009  . RENAL BIOPSY, PERCUTANEOUS  ?2013    OB  History    No data available       Home Medications    Prior to Admission medications   Medication Sig Start Date End Date Taking? Authorizing Provider  amoxicillin (AMOXIL) 500 MG capsule Take 2 capsules (1,000 mg total) by mouth 2 (two) times daily. Patient not taking: Reported on 01/27/2017 01/16/16   Melony Overly, MD  azithromycin (ZITHROMAX) 250 MG tablet Take 1 tablet (250 mg total) by mouth daily. Take first 2 tablets together, then 1 every day until finished. 01/27/17   Vanessa Kick, MD  ferrous sulfate 325 (65 FE) MG tablet Take 1 tablet (325 mg total) by mouth 3 (three) times daily with meals. Patient not taking: Reported on 01/27/2017 07/05/14   Cristal Ford, DO  HYDROcodone-acetaminophen (NORCO) 5-325 MG tablet Take 1 tablet by mouth every 6 (six) hours as needed for moderate pain. Patient not taking: Reported on 01/27/2017 01/16/16   Melony Overly, MD  HYDROcodone-acetaminophen (NORCO/VICODIN) 5-325 MG tablet Take 1-2 tablets by mouth every 4 (four) hours as needed. Patient not taking: Reported on 01/27/2017 06/14/15   Davonna Belling, MD  ibuprofen (ADVIL,MOTRIN) 600 MG tablet Take 1 tablet (600 mg total) by mouth every 6 (six) hours as needed for moderate pain. Patient not taking: Reported on 01/27/2017 01/16/16   Melony Overly, MD  methocarbamol (ROBAXIN) 500 MG tablet Take 1 tablet (500 mg total) by mouth 4 (four) times daily. Patient not taking: Reported  on 01/27/2017 12/24/16   Carlisle Cater, PA-C  naproxen (NAPROSYN) 500 MG tablet Take 1 tablet (500 mg total) by mouth 2 (two) times daily. Patient not taking: Reported on 01/27/2017 12/24/16   Carlisle Cater, PA-C  ondansetron (ZOFRAN) 4 MG tablet Take 1 tablet (4 mg total) by mouth every 6 (six) hours. Patient not taking: Reported on 01/27/2017 09/10/15   Delos Haring, PA-C  predniSONE (STERAPRED UNI-PAK 21 TAB) 10 MG (21) TBPK tablet Take by mouth daily. Take as directed. 01/27/17   Vanessa Kick, MD    Family  History Family History  Problem Relation Age of Onset  . Diabetes Other   . Cancer Other   . Hypertension Other   . Diabetes Father     Social History Social History   Tobacco Use  . Smoking status: Never Smoker  . Smokeless tobacco: Never Used  Substance Use Topics  . Alcohol use: Yes  . Drug use: No     Allergies   Sulfa antibiotics and Sulfonamide derivatives   Review of Systems Review of Systems All other systems negative except as documented in the HPI. All pertinent positives and negatives as reviewed in the HPI.  Physical Exam Updated Vital Signs BP (!) 160/109 (BP Location: Right Arm)   Pulse 90   Temp 99.5 F (37.5 C) (Oral)   Resp (!) 22   Ht 5\' 7"  (1.702 m)   Wt 74.8 kg (165 lb)   LMP 12/31/2016 (Approximate)   SpO2 100%   BMI 25.84 kg/m   Physical Exam  Constitutional: She is oriented to person, place, and time. She appears well-developed and well-nourished. No distress.  HENT:  Head: Normocephalic and atraumatic.  Mouth/Throat: Oropharynx is clear and moist.  Eyes: Pupils are equal, round, and reactive to light.  Neck: Normal range of motion. Neck supple.  Cardiovascular: Normal rate, regular rhythm and normal heart sounds. Exam reveals no gallop and no friction rub.  No murmur heard. Pulmonary/Chest: Effort normal and breath sounds normal. No respiratory distress. She has no wheezes.  Abdominal: Soft. Bowel sounds are normal. She exhibits no distension. There is no tenderness.  Neurological: She is alert and oriented to person, place, and time. She exhibits normal muscle tone. Coordination normal.  Skin: Skin is warm and dry. Capillary refill takes less than 2 seconds. No rash noted. No erythema.  Psychiatric: She has a normal mood and affect. Her behavior is normal.  Nursing note and vitals reviewed.    ED Treatments / Results  Labs (all labs ordered are listed, but only abnormal results are displayed) Labs Reviewed  BASIC METABOLIC  PANEL - Abnormal; Notable for the following components:      Result Value   Potassium 3.3 (*)    Calcium 8.4 (*)    All other components within normal limits  CBC - Abnormal; Notable for the following components:   RBC 3.68 (*)    Hemoglobin 9.5 (*)    HCT 30.5 (*)    MCH 25.8 (*)    RDW 17.0 (*)    All other components within normal limits  I-STAT TROPONIN, ED  I-STAT BETA HCG BLOOD, ED ([facility], [facility], [facility] ONLY)    EKG  EKG Interpretation  Date/Time:  Wednesday January 27 2017 19:28:21 EST Ventricular Rate:  89 PR Interval:  122 QRS Duration: 78 QT Interval:  360 QTC Calculation: 438 R Axis:   23 Text Interpretation:  no changes from prior today Confirmed by Thomasene Lot, [place] [phone]) on 01/27/2017 11:35:46 PM  Radiology Dg Chest 2 View  Result Date: 01/27/2017 CLINICAL DATA:  Left chest pain, lupus, shortness of breath EXAM: CHEST  2 VIEW COMPARISON:  01/27/2017 FINDINGS: Stable cardiomegaly with central vascular congestion. Chronic bilateral basilar interstitial fibrosis. Artifact overlies the upper lobes. No superimposed CHF, definite pneumonia, collapse or consolidation. No pleural effusion or pneumothorax. Trachea is midline. No acute osseous finding. IMPRESSION: Stable cardiomegaly without CHF Chronic bibasilar interstitial fibrosis pattern No interval change or superimposed acute process Electronically Signed   By: Jerilynn Mages.  Shick M.D.   On: 01/27/2017 19:45   Dg Chest 2 View  Result Date: 01/27/2017 CLINICAL DATA:  Anterior chest pain radiating to LEFT chest for 5 days. LEFT hand numbness and chills. History of lupus. EXAM: CHEST  2 VIEW COMPARISON:  Chest radiograph July 04, 2014 FINDINGS: Cardiac silhouette is moderately enlarged. Mild bronchitic changes. Patchy bibasilar airspace opacities similar to prior radiograph. No pleural effusion. No pneumothorax. Soft tissue planes and included osseous structures are nonsuspicious. IMPRESSION: Chronic bibasilar airspace opacities  though, there could be an acute component. Mild bronchitic changes. Slightly increased moderate cardiomegaly. Electronically Signed   By: Elon Alas M.D.   On: 01/27/2017 18:17   Ct Angio Chest Pe W/cm &/or Wo Cm  Result Date: 01/27/2017 CLINICAL DATA:  Shortness of breath. Shortness of breath. History of lupus. EXAM: CT ANGIOGRAPHY CHEST WITH CONTRAST TECHNIQUE: Multidetector CT imaging of the chest was performed using the standard protocol during bolus administration of intravenous contrast. Multiplanar CT image reconstructions and MIPs were obtained to evaluate the vascular anatomy. CONTRAST:  145mL ISOVUE-370 IOPAMIDOL (ISOVUE-370) INJECTION 76% COMPARISON:  Chest radiographs January 27, 2017 FINDINGS: Mild respiratory motion degraded examination. CARDIOVASCULAR: Adequate contrast opacification of the pulmonary artery's. Main pulmonary artery is not enlarged. No pulmonary arterial filling defects to the level of the subsegmental branches. Moderate cardiomegaly. No pericardial effusion. Thoracic aorta is normal course and caliber, unremarkable. MEDIASTINUM/NODES: Bilateral axillary lymphadenopathy measuring to 16 mm in short axis. Residual thymic tissue versus reactivation. LUNGS/PLEURA: Tracheobronchial tree is patent, no pneumothorax. Mild bronchial wall thickening. Bibasilar reticular changes and ground-glass opacities without bronchiectasis, pleural effusion. LEFT lung base calcified pleural plaque. 4 mm ground-glass nodules along RIGHT major fissure, nonspecific and no routine indicated follow-up. UPPER ABDOMEN: Included view of the abdomen is unremarkable. MUSCULOSKELETAL: Nonacute. Multiple subcentimeter RIGHT thyroid nodules, no routine indicated follow-up. Review of the MIP images confirms the above findings. IMPRESSION: 1. No acute pulmonary embolism on this respiratory motion degraded examination. 2. Mild bronchial wall thickening seen with reactive airway disease and bronchitis without  focal consolidation. 3. 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 LEFT lung base calcified pleural plaque. 4. Moderate cardiomegaly. 5. Nonspecific bilateral axillary lymphadenopathy. Electronically Signed   By: Elon Alas M.D.   On: 01/27/2017 21:38    Procedures Procedures (including critical care time)  Medications Ordered in ED Medications  iopamidol (ISOVUE-370) 76 % injection 100 mL (100 mLs Intravenous Contrast Given 01/27/17 2113)  ketorolac (TORADOL) 30 MG/ML injection 30 mg (30 mg Intravenous Given 01/27/17 2329)     Initial Impression / Assessment and Plan / ED Course  I have reviewed the triage vital signs and the nursing notes.  Pertinent labs & imaging results that were available during my care of the patient were reviewed by me and considered in my medical decision making (see chart for details).     Patient will be treated for possible early acute pericarditis however she does not have any of the classic findings  for this other than the chest pain.  We will refer her to cardiology as well.  Told to return here for any worsening in her condition.  Final Clinical Impressions(s) / ED Diagnoses   Final diagnoses:  None    ED Discharge Orders    None       Dalia Heading, PA-C 01/28/17 0018    Macarthur Critchley, MD 02/02/17 2324

## 2017-01-28 NOTE — Discharge Instructions (Signed)
Follow-up with the cardiologist provided. return here as needed.

## 2017-02-23 ENCOUNTER — Encounter: Payer: Self-pay | Admitting: Interventional Cardiology

## 2017-02-23 ENCOUNTER — Ambulatory Visit: Payer: Medicaid Other | Admitting: Interventional Cardiology

## 2017-02-23 VITALS — BP 122/78 | HR 79 | Ht 67.0 in | Wt 166.1 lb

## 2017-02-23 DIAGNOSIS — R0789 Other chest pain: Secondary | ICD-10-CM

## 2017-02-23 DIAGNOSIS — L930 Discoid lupus erythematosus: Secondary | ICD-10-CM

## 2017-02-23 DIAGNOSIS — K219 Gastro-esophageal reflux disease without esophagitis: Secondary | ICD-10-CM | POA: Diagnosis not present

## 2017-02-23 NOTE — Progress Notes (Signed)
Cardiology Office Note   Date:  02/23/2017   ID:  Christine Todd, DOB [birth year], MRN [id number]  PCP:  Patient, No Pcp Per    No chief complaint on file. chest pain   Wt Readings from Last 3 Encounters:  02/23/17 166 lb 1.9 oz (75.4 kg)  01/27/17 165 lb (74.8 kg)  07/04/14 172 lb (78 kg)       History of Present Illness: Christine Todd is a 42 y.o. female who is being seen today for the evaluation of chest pain at the request of No ref. provider [facility] ED, Dr. Thomasene Lot.  She has had chest pain, worse with lying down or with laughing hard.  It is a twinge.  Squatting down or standing up will cause chest pain.   She has lupus and had this type of pain in the past when she was diagnosed with lupus.   Denies :  Dizziness. Leg edema. Nitroglycerin use. Orthopnea. Palpitations. Paroxysmal nocturnal dyspnea. Shortness of breath. Syncope.   She took a steroid dose pack with relief of her chest pain.     Past Medical History:  Diagnosis Date  . Anemia   . Arthritis    "hands and feet" (07/04/2014)  . GERD (gastroesophageal reflux disease)   . Headache    "weekly" (07/04/2014)  . History of blood transfusion 07/04/2014   "related to anemia"  . Pneumonia "several times"  . 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 SLE (systemic lupus erythematosus) ([place])     Past Surgical History:  Procedure Laterality Date  . DILATION AND CURETTAGE OF UTERUS  2009  . RENAL BIOPSY, PERCUTANEOUS  ?2013     Current Outpatient Medications  Medication Sig Dispense Refill  . amoxicillin (AMOXIL) 500 MG capsule Take 2 capsules (1,000 mg total) by mouth 2 (two) times daily. 40 capsule 0  . azithromycin (ZITHROMAX) 250 MG tablet Take 1 tablet (250 mg total) by mouth daily. Take first 2 tablets together, then 1 every day until finished. 6 tablet 0  . colchicine 0.6 MG tablet Take 1 tablet (0.6 mg total) by mouth 2 (two) times daily. 180 tablet 0  . ferrous sulfate 325 (65 FE) MG tablet Take 1  tablet (325 mg total) by mouth 3 (three) times daily with meals. 90 tablet 0  . HYDROcodone-acetaminophen (NORCO) 5-325 MG tablet Take 1 tablet by mouth every 6 (six) hours as needed for moderate pain. 15 tablet 0  . HYDROcodone-acetaminophen (NORCO/VICODIN) 5-325 MG tablet Take 1-2 tablets by mouth every 4 (four) hours as needed. 10 tablet 0  . ibuprofen (ADVIL,MOTRIN) 600 MG tablet Take 1 tablet (600 mg total) by mouth every 6 (six) hours as needed for moderate pain. 30 tablet 0  . indomethacin (INDOCIN) 25 MG capsule Take 25 mg by mouth 3 (three) times daily as needed.    . methocarbamol (ROBAXIN) 500 MG tablet Take 1 tablet (500 mg total) by mouth 4 (four) times daily. 20 tablet 0  . naproxen (NAPROSYN) 500 MG tablet Take 1 tablet (500 mg total) by mouth 2 (two) times daily. 20 tablet 0  . ondansetron (ZOFRAN) 4 MG tablet Take 1 tablet (4 mg total) by mouth every 6 (six) hours. 12 tablet 0  . predniSONE (STERAPRED UNI-PAK 21 TAB) 10 MG (21) TBPK tablet Take by mouth daily. Take as directed. 21 tablet 0   No current facility-administered medications for this visit.     Allergies:   Sulfa antibiotics and  Sulfonamide derivatives    Social History:  The patient  reports that  has never smoked. she has never used smokeless tobacco. She reports that she drinks alcohol. She reports that she does not use drugs.   Family History:  The patient's family history includes Cancer in her other; Diabetes in her father and other; Hypertension in her other.    ROS:  Please see the history of present illness.   Otherwise, review of systems are positive for chest pain.   All other systems are reviewed and negative.    PHYSICAL EXAM: VS:  BP 122/78   Pulse 79   Ht 5\' 7"  (1.702 m)   Wt 166 lb 1.9 oz (75.4 kg)   SpO2 92%   BMI 26.02 kg/m  , BMI Body mass index is 26.02 kg/m. GEN: Well nourished, well developed, in no acute distress  HEENT: normal  Neck: no JVD, carotid bruits, or masses Cardiac:  RRR; no murmurs, rubs, or gallops,no edema  Respiratory:  clear to auscultation bilaterally, normal work of breathing GI: soft, nontender, nondistended, + BS MS: no deformity or atrophy  Skin: warm and dry, no rash Neuro:  Strength and sensation are intact Psych: euthymic mood, full affect   EKG:   The ekg ordered today demonstrates NSR, inferior T wave inversion; ST changes of AVL are less pronounced   Recent Labs: 2017-02-04: BUN 10; Creatinine, Ser 0.92; Hemoglobin 9.5; Platelets 214; Potassium 3.3; Sodium 135   Lipid Panel No results found for: CHOL, TRIG, HDL, CHOLHDL, VLDL, LDLCALC, LDLDIRECT    Other studies Reviewed: Additional studies/ records that were reviewed today with results demonstrating: 2007 echo showed normal LV function.   ASSESSMENT AND PLAN:  1. Chest pain: I think this was most likely related to pericarditis.  Sx better after steroids.  Check echo given history.  Her chest discomfort is not typical for ischemia. 2. Lupus: trying to find a rheumatologist.   3. GERD: no recent sx.  SHe changed her diet and symptoms improved.   Current medicines are reviewed at length with the patient today.  The patient concerns regarding her medicines were addressed.  The following changes have been made:  No change  Labs/ tests ordered today include:  No orders of the defined types were placed in this encounter.   Recommend 150 minutes/week of aerobic exercise Low fat, low carb, high fiber diet recommended  Disposition:   FU in as needed based on echo   Signed, Larae Grooms, MD  02/23/2017 3:24 PM    [facility] [address], [place], [place]  [address] Phone: [phone]; Fax: [phone]

## 2017-02-23 NOTE — Patient Instructions (Signed)
Medication Instructions:  Your physician recommends that you continue on your current medications as directed. Please refer to the Current Medication list given to you today.   Labwork: None ordered  Testing/Procedures: Your physician has requested that you have an echocardiogram. Echocardiography is a painless test that uses sound waves to create images of your heart. It provides your doctor with information about the size and shape of your heart and how well your heart's chambers and valves are working. This procedure takes approximately one hour. There are no restrictions for this procedure.  Follow-Up: Your physician wants you to follow-up AS NEEDED   Any Other Special Instructions Will Be Listed Below (If Applicable).  Echocardiogram An echocardiogram, or echocardiography, uses sound waves (ultrasound) to produce an image of your heart. The echocardiogram is simple, painless, obtained within a short period of time, and offers valuable information to your health care provider. The images from an echocardiogram can provide information such as:  Evidence of coronary artery disease (CAD).  Heart size.  Heart muscle function.  Heart valve function.  Aneurysm detection.  Evidence of a past heart attack.  Fluid buildup around the heart.  Heart muscle thickening.  Assess heart valve function.  Tell a health care provider about:  Any allergies you have.  All medicines you are taking, including vitamins, herbs, eye drops, creams, and over-the-counter medicines.  Any problems you or family members have had with anesthetic medicines.  Any blood disorders you have.  Any surgeries you have had.  Any medical conditions you have.  Whether you are pregnant or may be pregnant. What happens before the procedure? No special preparation is needed. Eat and drink normally. What happens during the procedure?  In order to produce an image of your heart, gel will be applied to your  chest and a wand-like tool (transducer) will be moved over your chest. The gel will help transmit the sound waves from the transducer. The sound waves will harmlessly bounce off your heart to allow the heart images to be captured in real-time motion. These images will then be recorded.  You may need an IV to receive a medicine that improves the quality of the pictures. What happens after the procedure? You may return to your normal schedule including diet, activities, and medicines, unless your health care provider tells you otherwise. This information is not intended to replace advice given to you by your health care provider. Make sure you discuss any questions you have with your health care provider. Document Released: 01/17/2000 Document Revised: 09/07/2015 Document Reviewed: 09/26/2012 Elsevier Interactive Patient Education  2017 [employer]    If you need a refill on your cardiac medications before your next appointment, please call your pharmacy.   

## 2017-02-26 ENCOUNTER — Telehealth (HOSPITAL_COMMUNITY): Payer: Self-pay | Admitting: Interventional Cardiology

## 2017-02-26 ENCOUNTER — Other Ambulatory Visit (HOSPITAL_COMMUNITY): Payer: Medicaid Other

## 2017-03-05 NOTE — Telephone Encounter (Signed)
User: Cherie Dark A Date/time: 03/04/17 11:33 AM  Comment: Called pt and lmsg for her to CB to get r/s for echo .Marland KitchenRG  Context:  Outcome: Left Message  Phone number: [phone] Phone Type: Home Phone  Comm. type: Telephone Call type: Outgoing  Contact: [name] Relation to patient: Self    User: Cherie Dark A Date/time: 03/01/17 9:26 AM  Comment: Called pt to r/s her echo that was cancelled on 1/25.Marland KitchenRG  Context:  Outcome: Left Message  Phone number: [phone] Phone Type: Home Phone  Comm. type: Telephone Call type: Outgoing  Contact: [name] Relation to patient: Self    User: Cherie Dark A Date/time: 02/26/17 1:33 PM  Comment: called pt back to r/s echo that needed to be cancelled today.Vassie Moment  Context:  Outcome: Left Message  Phone number: [phone] Phone Type: Home Phone  Comm. type: Telephone Call type: Outgoing  Contact: [name] Relation to patient: Self

## 2017-03-09 ENCOUNTER — Encounter: Payer: Self-pay | Admitting: Interventional Cardiology

## 2017-03-23 ENCOUNTER — Encounter: Payer: Self-pay | Admitting: Interventional Cardiology

## 2017-08-01 ENCOUNTER — Encounter (HOSPITAL_COMMUNITY): Payer: Self-pay | Admitting: Emergency Medicine

## 2017-08-01 ENCOUNTER — Ambulatory Visit (HOSPITAL_COMMUNITY)
Admission: EM | Admit: 2017-08-01 | Discharge: 2017-08-01 | Disposition: A | Discharge status: Home / Self Care | Payer: Medicaid Other | Attending: Family Medicine | Admitting: Family Medicine

## 2017-08-01 DIAGNOSIS — H66002 Acute suppurative otitis media without spontaneous rupture of ear drum, left ear: Secondary | ICD-10-CM | POA: Diagnosis not present

## 2017-08-01 MED ORDER — AMOXICILLIN-POT CLAVULANATE 875-125 MG PO TABS: 1.0000 | ORAL_TABLET | Freq: Two times a day (BID) | ORAL | 0 refills | Status: AC

## 2017-08-01 NOTE — ED Triage Notes (Signed)
Pt states her L ear pain woke her up this morning.

## 2017-08-01 NOTE — ED Provider Notes (Signed)
[place]    CSN: [id number] Arrival date & time: 08/01/17  1325     History   Chief Complaint Chief Complaint  Patient presents with  . Appointment    130pm  . Otalgia    HPI Christine Todd is a 42 y.o. female.   Christine Todd presents with complaints of left ear pain which woke her this morning. Pain 5/10 in severity. No drainage. No fevers. Has recently had mild cough and congestion. States she feels she is having a lupus flare as she has been feeling more aches and pain. No jaw pain. No hearing loss. Took tylenol this morning which did not seem to help. No gi/gu complaints.   ROS per HPI.      Past Medical History:  Diagnosis Date  . Anemia   . Arthritis    "hands and feet" (07/04/2014)  . GERD (gastroesophageal reflux disease)   . Headache    "weekly" (07/04/2014)  . History of blood transfusion 07/04/2014   "related to anemia"  . Pneumonia "several times"  . 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 SLE (systemic lupus erythematosus) ([place])     Patient Active Problem List   Diagnosis Date Noted  . Symptomatic anemia 07/04/2014  . Benign essential HTN 07/04/2014  . Hypokalemia 07/04/2014  . FOOT PAIN, LEFT 12/10/2009  . KNEE PAIN 11/13/2008  . SKIN RASH 11/13/2008  . UTI 11/09/2008  . Iron deficiency anemia 11/01/2008  . MICROSCOPIC HEMATURIA 11/01/2008  . INSOMNIA 07/31/2008  . HEADACHE 07/31/2008  . ABSCESS, SKIN 04/27/2008  . FREQUENCY, URINARY 03/01/2008  . OVARIAN CYST, RUPTURED 11/08/2007  . GERD 09/24/2007  . SLE 11/25/2006  . CANDIDIASIS 11/18/2006  . SORE THROAT 11/18/2006  . ABNORMAL RESULT, FUNCTION STUDY, LIVER 11/18/2006    Past Surgical History:  Procedure Laterality Date  . DILATION AND CURETTAGE OF UTERUS  2009  . RENAL BIOPSY, PERCUTANEOUS  ?2013    OB History   None      Home Medications    Prior to Admission medications   Medication Sig Start Date End Date Taking? Authorizing Provider    amoxicillin-clavulanate (AUGMENTIN) 875-125 MG tablet Take 1 tablet by mouth every 12 (twelve) hours for 7 days. 08/01/17 08/08/17  Zigmund Gottron, NP  ferrous sulfate 325 (65 FE) MG tablet Take 1 tablet (325 mg total) by mouth 3 (three) times daily with meals. Patient not taking: Reported on 08/01/2017 07/05/14   Cristal Ford, DO  HYDROcodone-acetaminophen (NORCO) 5-325 MG tablet Take 1 tablet by mouth every 6 (six) hours as needed for moderate pain. Patient not taking: Reported on 08/01/2017 01/16/16   Melony Overly, MD  HYDROcodone-acetaminophen (NORCO/VICODIN) 5-325 MG tablet Take 1-2 tablets by mouth every 4 (four) hours as needed. Patient not taking: Reported on 08/01/2017 06/14/15   Davonna Belling, MD  ibuprofen (ADVIL,MOTRIN) 600 MG tablet Take 1 tablet (600 mg total) by mouth every 6 (six) hours as needed for moderate pain. 01/16/16   Melony Overly, MD  indomethacin (INDOCIN) 25 MG capsule Take 25 mg by mouth 3 (three) times daily as needed.    [provider]  methocarbamol (ROBAXIN) 500 MG tablet Take 1 tablet (500 mg total) by mouth 4 (four) times daily. Patient not taking: Reported on 08/01/2017 12/24/16   Carlisle Cater, PA-C  naproxen (NAPROSYN) 500 MG tablet Take 1 tablet (500 mg total) by mouth 2 (two) times daily. 12/24/16   Carlisle Cater, PA-C  ondansetron [facility])  4 MG tablet Take 1 tablet (4 mg total) by mouth every 6 (six) hours. 09/10/15   Delos Haring, PA-C  predniSONE (STERAPRED UNI-PAK 21 TAB) 10 MG (21) TBPK tablet Take by mouth daily. Take as directed. 01/27/17   Vanessa Kick, MD    Family History Family History  Problem Relation Age of Onset  . Diabetes Other   . Cancer Other   . Hypertension Other   . Diabetes Father     Social History Social History   Tobacco Use  . Smoking status: Never Smoker  . Smokeless tobacco: Never Used  Substance Use Topics  . Alcohol use: Yes  . Drug use: No     Allergies   Sulfa antibiotics and Sulfonamide  derivatives   Review of Systems Review of Systems   Physical Exam Triage Vital Signs ED Triage Vitals [08/01/17 1336]  Enc Vitals Group     BP (!) 157/103     Pulse Rate 90     Resp 18     Temp 98.6 F (37 C)     Temp src      SpO2 100 %     Weight      Height      Head Circumference      Peak Flow      Pain Score      Pain Loc      Pain Edu?      Excl. in [place]?    No data found.  Updated Vital Signs BP (!) 157/103   Pulse 90   Temp 98.6 F (37 C)   Resp 18   SpO2 100%   Visual Acuity Right Eye Distance:   Left Eye Distance:   Bilateral Distance:    Right Eye Near:   Left Eye Near:    Bilateral Near:     Physical Exam  Constitutional: She is oriented to person, place, and time. She appears well-developed and well-nourished. No distress.  HENT:  Head: Normocephalic and atraumatic.  Right Ear: Tympanic membrane, external ear and ear canal normal.  Left Ear: External ear and ear canal normal. Tympanic membrane is erythematous and bulging.  Nose: Nose normal.  Mouth/Throat: Uvula is midline, oropharynx is clear and moist and mucous membranes are normal. No tonsillar exudate.  Eyes: Pupils are equal, round, and reactive to light. Conjunctivae and EOM are normal.  Cardiovascular: Normal rate, regular rhythm and normal heart sounds.  Pulmonary/Chest: Effort normal and breath sounds normal.  Neurological: She is alert and oriented to person, place, and time.  Skin: Skin is warm and dry.     UC Treatments / Results  Labs (all labs ordered are listed, but only abnormal results are displayed) Labs Reviewed - No data to display  EKG None  Radiology No results found.  Procedures Procedures (including critical care time)  Medications Ordered in UC Medications - No data to display  Initial Impression / Assessment and Plan / UC Course  I have reviewed the triage vital signs and the nursing notes.  Pertinent labs & imaging results that were available  during my care of the patient were reviewed by me and considered in my medical decision making (see chart for details).     Left otitis media present. 7 days of augmentin initiated. Tylenol and/or ibuprofen as needed for pain or fevers.  If symptoms worsen or do not improve in the next week to return to be seen or to follow up with PCP.  Patient verbalized understanding and  agreeable to plan.   Final Clinical Impressions(s) / UC Diagnoses   Final diagnoses:  Acute suppurative otitis media of left ear without spontaneous rupture of tympanic membrane, recurrence not specified     Discharge Instructions     Push fluids to ensure adequate hydration and keep secretions thin.  Tylenol and/or ibuprofen as needed for pain or fevers.  Complete course of antibiotics.  If symptoms worsen or do not improve in the next week to return to be seen or to follow up with your PCP.     ED Prescriptions    Medication Sig Dispense Auth. Provider   amoxicillin-clavulanate (AUGMENTIN) 875-125 MG tablet Take 1 tablet by mouth every 12 (twelve) hours for 7 days. 14 tablet Zigmund Gottron, NP     Controlled Substance Prescriptions [place] Controlled Substance Registry consulted? Not Applicable   Zigmund Gottron, NP 08/01/17 1358

## 2017-08-01 NOTE — Discharge Instructions (Signed)
Push fluids to ensure adequate hydration and keep secretions thin.  Tylenol and/or ibuprofen as needed for pain or fevers.  Complete course of antibiotics.  If symptoms worsen or do not improve in the next week to return to be seen or to follow up with your PCP.

## 2017-08-01 NOTE — ED Notes (Signed)
Bed: UC01 Expected date:  Expected time:  Means of arrival:  Comments: 

## 2017-09-11 ENCOUNTER — Ambulatory Visit (HOSPITAL_COMMUNITY)
Admission: EM | Admit: 2017-09-11 | Discharge: 2017-09-11 | Disposition: A | Discharge status: Home / Self Care | Payer: Medicaid Other | Attending: Internal Medicine | Admitting: Internal Medicine

## 2017-09-11 ENCOUNTER — Encounter (HOSPITAL_COMMUNITY): Payer: Self-pay | Admitting: *Deleted

## 2017-09-11 DIAGNOSIS — N3001 Acute cystitis with hematuria: Secondary | ICD-10-CM | POA: Insufficient documentation

## 2017-09-11 DIAGNOSIS — E876 Hypokalemia: Secondary | ICD-10-CM | POA: Diagnosis not present

## 2017-09-11 DIAGNOSIS — Z8249 Family history of ischemic heart disease and other diseases of the circulatory system: Secondary | ICD-10-CM | POA: Diagnosis not present

## 2017-09-11 DIAGNOSIS — N907 Vulvar cyst: Secondary | ICD-10-CM | POA: Diagnosis not present

## 2017-09-11 DIAGNOSIS — K219 Gastro-esophageal reflux disease without esophagitis: Secondary | ICD-10-CM | POA: Diagnosis not present

## 2017-09-11 DIAGNOSIS — Z833 Family history of diabetes mellitus: Secondary | ICD-10-CM | POA: Diagnosis not present

## 2017-09-11 DIAGNOSIS — M329 Systemic lupus erythematosus, unspecified: Secondary | ICD-10-CM | POA: Diagnosis not present

## 2017-09-11 DIAGNOSIS — Z79899 Other long term (current) drug therapy: Secondary | ICD-10-CM | POA: Insufficient documentation

## 2017-09-11 DIAGNOSIS — I10 Essential (primary) hypertension: Secondary | ICD-10-CM | POA: Diagnosis not present

## 2017-09-11 DIAGNOSIS — N760 Acute vaginitis: Secondary | ICD-10-CM

## 2017-09-11 DIAGNOSIS — N3000 Acute cystitis without hematuria: Secondary | ICD-10-CM

## 2017-09-11 DIAGNOSIS — M1990 Unspecified osteoarthritis, unspecified site: Secondary | ICD-10-CM | POA: Insufficient documentation

## 2017-09-11 DIAGNOSIS — Z882 Allergy status to sulfonamides status: Secondary | ICD-10-CM | POA: Diagnosis not present

## 2017-09-11 DIAGNOSIS — Z791 Long term (current) use of non-steroidal anti-inflammatories (NSAID): Secondary | ICD-10-CM | POA: Diagnosis not present

## 2017-09-11 DIAGNOSIS — Z9889 Other specified postprocedural states: Secondary | ICD-10-CM | POA: Insufficient documentation

## 2017-09-11 LAB — POCT URINALYSIS DIP (DEVICE)
Bilirubin Urine: NEGATIVE
GLUCOSE, UA: NEGATIVE mg/dL
KETONES UR: NEGATIVE mg/dL
Nitrite: POSITIVE — AB
Urobilinogen, UA: 0.2 mg/dL (ref 0.0–1.0)
pH: 6 (ref 5.0–8.0)

## 2017-09-11 LAB — POCT PREGNANCY, URINE: PREG TEST UR: NEGATIVE

## 2017-09-11 MED ORDER — IPRATROPIUM-ALBUTEROL 0.5-2.5 (3) MG/3ML IN SOLN: 3.0000 mL | Freq: Once | RESPIRATORY_TRACT | Status: DC

## 2017-09-11 MED ORDER — CEPHALEXIN 500 MG PO CAPS: 500.0000 mg | ORAL_CAPSULE | Freq: Two times a day (BID) | ORAL | 0 refills | Status: AC

## 2017-09-11 MED ORDER — FLUCONAZOLE 200 MG PO TABS: ORAL_TABLET | ORAL | 0 refills | Status: DC

## 2017-09-11 NOTE — Discharge Instructions (Signed)
Please take one yeast pill today, followed by one at the end of course of antibiotics.  7 days of antibiotics for your uti.  Drink plenty of water to empty bladder regularly. Avoid alcohol and caffeine as these may irritate the bladder.   Please withhold from intercourse for the next week. Warm soaks/compresses to cyst may be helpful. Return if increases in size as may need to be opened, or follow up with gynecology.

## 2017-09-11 NOTE — ED Triage Notes (Signed)
C/O urinary urgency and frequency x 2 wks.  Believes she has yeast infection x 6 days.  Also feels she is developing a vulvar "boil".

## 2017-09-11 NOTE — ED Provider Notes (Signed)
[place]    CSN: [id number] Arrival date & time: 09/11/17  1607     History   Chief Complaint Chief Complaint  Patient presents with  . Appointment    4:00pm  . Urinary Frequency  . Abscess    HPI Christine Todd is a 42 y.o. female.   Christine Todd presents with complaints of urinary frequency and pressure, feeling like she isn't completely emptying her bladder which started approximately 2 weeks ago. Has tried drinking cranberry which has not helped. Some pressuring/discomfort at the end of voiding. No abdominal or back pain. No blood in urine. No fevers. Also with vaginal itching. No known discharge. Used one day monistat which briefly helped before it returned. LMP 7/18. Sexually active with one partner, doesn't use condoms. Denies any concern for STDs. Has had UTI's in the past. Hx of arthritis, headache, gerd, pneumonia, sle.    ROS per HPI.      Past Medical History:  Diagnosis Date  . Anemia   . Arthritis    "hands and feet" (07/04/2014)  . GERD (gastroesophageal reflux disease)   . Headache    "weekly" (07/04/2014)  . History of blood transfusion 07/04/2014   "related to anemia"  . Pneumonia "several times"  . 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 SLE (systemic lupus erythematosus) ([place])     Patient Active Problem List   Diagnosis Date Noted  . Symptomatic anemia 07/04/2014  . Benign essential HTN 07/04/2014  . Hypokalemia 07/04/2014  . FOOT PAIN, LEFT 12/10/2009  . KNEE PAIN 11/13/2008  . SKIN RASH 11/13/2008  . UTI 11/09/2008  . Iron deficiency anemia 11/01/2008  . MICROSCOPIC HEMATURIA 11/01/2008  . INSOMNIA 07/31/2008  . HEADACHE 07/31/2008  . ABSCESS, SKIN 04/27/2008  . FREQUENCY, URINARY 03/01/2008  . OVARIAN CYST, RUPTURED 11/08/2007  . GERD 09/24/2007  . SLE 11/25/2006  . CANDIDIASIS 11/18/2006  . SORE THROAT 11/18/2006  . ABNORMAL RESULT, FUNCTION STUDY, LIVER 11/18/2006    Past Surgical History:  Procedure Laterality  Date  . DILATION AND CURETTAGE OF UTERUS  2009  . RENAL BIOPSY, PERCUTANEOUS  ?2013    OB History   None      Home Medications    Prior to Admission medications   Medication Sig Start Date End Date Taking? Authorizing Provider  ibuprofen (ADVIL,MOTRIN) 600 MG tablet Take 1 tablet (600 mg total) by mouth every 6 (six) hours as needed for moderate pain. 01/16/16  Yes Melony Overly, MD  cephALEXin (KEFLEX) 500 MG capsule Take 1 capsule (500 mg total) by mouth 2 (two) times daily for 7 days. 09/11/17 09/18/17  Zigmund Gottron, NP  fluconazole (DIFLUCAN) 200 MG tablet Take once today. Take second pill at completion of antibiotics. 09/11/17   Zigmund Gottron, NP    Family History Family History  Problem Relation Age of Onset  . Diabetes Other   . Cancer Other   . Hypertension Other   . Diabetes Father     Social History Social History   Tobacco Use  . Smoking status: Never Smoker  . Smokeless tobacco: Never Used  Substance Use Topics  . Alcohol use: Yes    Comment: occasionally  . Drug use: No     Allergies   Sulfa antibiotics and Sulfonamide derivatives   Review of Systems Review of Systems   Physical Exam Triage Vital Signs ED Triage Vitals  Enc Vitals Group     BP 09/11/17 1624 Marland Kitchen)  146/87     Pulse Rate 09/11/17 1624 80     Resp 09/11/17 1624 16     Temp 09/11/17 1624 98.3 F (36.8 C)     Temp Source 09/11/17 1624 Oral     SpO2 09/11/17 1624 100 %     Weight --      Height --      Head Circumference --      Peak Flow --      Pain Score 09/11/17 1625 3     Pain Loc --      Pain Edu? --      Excl. in [place]? --    No data found.  Updated Vital Signs BP (!) 146/87   Pulse 80   Temp 98.3 F (36.8 C) (Oral)   Resp 16   LMP 08/19/2017 (Approximate)   SpO2 100%   Visual Acuity Right Eye Distance:   Left Eye Distance:   Bilateral Distance:    Right Eye Near:   Left Eye Near:    Bilateral Near:     Physical Exam  Constitutional: She is  oriented to person, place, and time. She appears well-developed and well-nourished. No distress.  Cardiovascular: Normal rate, regular rhythm and normal heart sounds.  Pulmonary/Chest: Effort normal and breath sounds normal.  Abdominal: Soft. There is no tenderness. There is no rigidity, no rebound, no guarding and no CVA tenderness.  Genitourinary:    Vaginal discharge found.  Genitourinary Comments: Rubbery small mobile mass to left labia majora, no fluctuance or surrounding redness; very thick white and red vaginal discharge on pelvic exam  Neurological: She is alert and oriented to person, place, and time.  Skin: Skin is warm and dry.     UC Treatments / Results  Labs (all labs ordered are listed, but only abnormal results are displayed) Labs Reviewed  POCT URINALYSIS DIP (DEVICE) - Abnormal; Notable for the following components:      Result Value   Hgb urine dipstick MODERATE (*)    Protein, ur >=300 (*)    Nitrite POSITIVE (*)    Leukocytes, UA TRACE (*)    All other components within normal limits  POCT PREGNANCY, URINE  CERVICOVAGINAL ANCILLARY ONLY    EKG None  Radiology No results found.  Procedures Procedures (including critical care time)  Medications Ordered in UC Medications - No data to display  Initial Impression / Assessment and Plan / UC Course  I have reviewed the triage vital signs and the nursing notes.  Pertinent labs & imaging results that were available during my care of the patient were reviewed by me and considered in my medical decision making (see chart for details).     Keflex for uti, warm soaks to labia, diflucan for yeast. Vaginal cytology pending. Will notify of any positive findings and if any changes to treatment are needed.  If symptoms worsen or do not improve in the next week to return to be seen or to follow up with PCP.  Return precautions provided. Patient verbalized understanding and agreeable to plan.    Final Clinical  Impressions(s) / UC Diagnoses   Final diagnoses:  Acute cystitis without hematuria  Acute vaginitis  Cyst, vulva     Discharge Instructions     Please take one yeast pill today, followed by one at the end of course of antibiotics.  7 days of antibiotics for your uti.  Drink plenty of water to empty bladder regularly. Avoid alcohol and caffeine as these may  irritate the bladder.   Please withhold from intercourse for the next week. Warm soaks/compresses to cyst may be helpful. Return if increases in size as may need to be opened, or follow up with gynecology.    ED Prescriptions    Medication Sig Dispense Auth. Provider   fluconazole (DIFLUCAN) 200 MG tablet Take once today. Take second pill at completion of antibiotics. 2 tablet Augusto Gamble B, NP   cephALEXin (KEFLEX) 500 MG capsule Take 1 capsule (500 mg total) by mouth 2 (two) times daily for 7 days. 14 capsule Zigmund Gottron, NP     Controlled Substance Prescriptions [place] Controlled Substance Registry consulted? Not Applicable   Zigmund Gottron, NP 09/11/17 1658

## 2017-09-13 LAB — CERVICOVAGINAL ANCILLARY ONLY
Bacterial vaginitis: NEGATIVE
CHLAMYDIA, DNA PROBE: NEGATIVE
Candida vaginitis: NEGATIVE
Neisseria Gonorrhea: NEGATIVE
TRICH (WINDOWPATH): NEGATIVE

## 2017-11-02 ENCOUNTER — Ambulatory Visit (HOSPITAL_COMMUNITY)
Admission: EM | Admit: 2017-11-02 | Discharge: 2017-11-02 | Disposition: A | Discharge status: Home / Self Care | Payer: Medicaid Other | Attending: Family Medicine | Admitting: Family Medicine

## 2017-11-02 ENCOUNTER — Other Ambulatory Visit: Payer: Self-pay

## 2017-11-02 ENCOUNTER — Encounter (HOSPITAL_COMMUNITY): Payer: Self-pay | Admitting: Emergency Medicine

## 2017-11-02 DIAGNOSIS — I10 Essential (primary) hypertension: Secondary | ICD-10-CM | POA: Insufficient documentation

## 2017-11-02 DIAGNOSIS — M1990 Unspecified osteoarthritis, unspecified site: Secondary | ICD-10-CM | POA: Insufficient documentation

## 2017-11-02 DIAGNOSIS — M329 Systemic lupus erythematosus, unspecified: Secondary | ICD-10-CM | POA: Insufficient documentation

## 2017-11-02 DIAGNOSIS — R7989 Other specified abnormal findings of blood chemistry: Secondary | ICD-10-CM | POA: Diagnosis not present

## 2017-11-02 DIAGNOSIS — E860 Dehydration: Secondary | ICD-10-CM | POA: Diagnosis not present

## 2017-11-02 DIAGNOSIS — Z833 Family history of diabetes mellitus: Secondary | ICD-10-CM | POA: Diagnosis not present

## 2017-11-02 DIAGNOSIS — Z79899 Other long term (current) drug therapy: Secondary | ICD-10-CM | POA: Insufficient documentation

## 2017-11-02 DIAGNOSIS — R531 Weakness: Secondary | ICD-10-CM | POA: Diagnosis present

## 2017-11-02 DIAGNOSIS — D638 Anemia in other chronic diseases classified elsewhere: Secondary | ICD-10-CM | POA: Diagnosis not present

## 2017-11-02 DIAGNOSIS — Z882 Allergy status to sulfonamides status: Secondary | ICD-10-CM | POA: Diagnosis not present

## 2017-11-02 LAB — CBC
HCT: 32.8 % — ABNORMAL LOW (ref 36.0–46.0)
Hemoglobin: 10 g/dL — ABNORMAL LOW (ref 12.0–15.0)
MCH: 26.7 pg (ref 26.0–34.0)
MCHC: 30.5 g/dL (ref 30.0–36.0)
MCV: 87.7 fL (ref 78.0–100.0)
Platelets: 198 10*3/uL (ref 150–400)
RBC: 3.74 MIL/uL — ABNORMAL LOW (ref 3.87–5.11)
RDW: 14.8 % (ref 11.5–15.5)
WBC: 5 10*3/uL (ref 4.0–10.5)

## 2017-11-02 LAB — POCT I-STAT, CHEM 8
BUN: 17 mg/dL (ref 6–20)
CALCIUM ION: 1.13 mmol/L — AB (ref 1.15–1.40)
CHLORIDE: 102 mmol/L (ref 98–111)
Creatinine, Ser: 1.7 mg/dL — ABNORMAL HIGH (ref 0.44–1.00)
Glucose, Bld: 93 mg/dL (ref 70–99)
HCT: 33 % — ABNORMAL LOW (ref 36.0–46.0)
Hemoglobin: 11.2 g/dL — ABNORMAL LOW (ref 12.0–15.0)
POTASSIUM: 4.1 mmol/L (ref 3.5–5.1)
Sodium: 138 mmol/L (ref 135–145)
TCO2: 25 mmol/L (ref 22–32)

## 2017-11-02 MED ORDER — SODIUM CHLORIDE 0.9 % IV SOLN
Freq: Once | INTRAVENOUS | Status: AC
  Administered 2017-11-02: 20:00:00 via INTRAVENOUS

## 2017-11-02 NOTE — ED Provider Notes (Signed)
[place]    CSN: [id number] Arrival date & time: 11/02/17  1828     History   Chief Complaint Chief Complaint  Patient presents with  . Weakness  . Dizziness  . Numbness    HPI Christine Todd is a 42 y.o. female.   Pt reports fatigue, dizziness, and lower extremity numbness and tingling that started after she got her flu shot last week.  She also started her period last week and had a renal biopsy to evaluate her lupus which she has had for 19 years.  Patient has had loss of appetite and nausea over the last week or so but she does not have any nausea currently.  She denies abdominal pain currently.  She also denies fever or urinary symptoms.  Patient is on Plaquenil currently.  She does not always take it as prescribed.  Patient has lost her appetite and is not able to take more than sips of fluid at a time.  Patient works at the [employer] and also works as a Programme researcher, broadcasting/film/video.     Past Medical History:  Diagnosis Date  . Anemia   . Arthritis    "hands and feet" (07/04/2014)  . GERD (gastroesophageal reflux disease)   . Headache    "weekly" (07/04/2014)  . History of blood transfusion 07/04/2014   "related to anemia"  . Pneumonia "several times"  . 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 SLE (systemic lupus erythematosus) ([place])     Patient Active Problem List   Diagnosis Date Noted  . Symptomatic anemia 07/04/2014  . Benign essential HTN 07/04/2014  . Hypokalemia 07/04/2014  . FOOT PAIN, LEFT 12/10/2009  . KNEE PAIN 11/13/2008  . SKIN RASH 11/13/2008  . UTI 11/09/2008  . Iron deficiency anemia 11/01/2008  . MICROSCOPIC HEMATURIA 11/01/2008  . INSOMNIA 07/31/2008  . HEADACHE 07/31/2008  . ABSCESS, SKIN 04/27/2008  . FREQUENCY, URINARY 03/01/2008  . OVARIAN CYST, RUPTURED 11/08/2007  . GERD 09/24/2007  . SLE 11/25/2006  . CANDIDIASIS 11/18/2006  . SORE THROAT 11/18/2006  . ABNORMAL RESULT, FUNCTION STUDY, LIVER 11/18/2006    Past Surgical  History:  Procedure Laterality Date  . DILATION AND CURETTAGE OF UTERUS  2009  . RENAL BIOPSY, PERCUTANEOUS  ?2013    OB History   None      Home Medications    Prior to Admission medications   Medication Sig Start Date End Date Taking? Authorizing Provider  hydroxychloroquine (PLAQUENIL) 200 MG tablet Take by mouth. 09/28/17  Yes [provider]  lisinopril (PRINIVIL,ZESTRIL) 5 MG tablet Take by mouth. 10/26/17  Yes [provider]  Vitamin D, Ergocalciferol, (DRISDOL) 50000 units CAPS capsule Take by mouth. 10/29/17 01/27/18 Yes [provider]  ibuprofen (ADVIL,MOTRIN) 600 MG tablet Take 1 tablet (600 mg total) by mouth every 6 (six) hours as needed for moderate pain. 01/16/16   Melony Overly, MD    Family History Family History  Problem Relation Age of Onset  . Diabetes Other   . Cancer Other   . Hypertension Other   . Diabetes Father     Social History Social History   Tobacco Use  . Smoking status: Never Smoker  . Smokeless tobacco: Never Used  Substance Use Topics  . Alcohol use: Yes    Comment: occasionally  . Drug use: No     Allergies   Sulfa antibiotics and Sulfonamide derivatives   Review of Systems Review of Systems  Constitutional:  Positive for appetite change and fatigue. Negative for activity change, chills, diaphoresis, fever and unexpected weight change.  HENT: Negative.   Respiratory: Negative.   Cardiovascular: Negative.   Gastrointestinal: Positive for nausea.  Genitourinary: Negative.   Musculoskeletal: Positive for myalgias.  Skin: Negative.   Neurological: Positive for dizziness, weakness, light-headedness and numbness.  Hematological: Negative.   Psychiatric/Behavioral: Negative.      Physical Exam Triage Vital Signs ED Triage Vitals  Enc Vitals Group     BP 11/02/17 1913 96/60     Pulse Rate 11/02/17 1913 (!) 104     Resp --      Temp 11/02/17 1913 98.5 F (36.9 C)     Temp Source 11/02/17 1913  Oral     SpO2 11/02/17 1913 100 %     Weight --      Height --      Head Circumference --      Peak Flow --      Pain Score 11/02/17 1914 0     Pain Loc --      Pain Edu? --      Excl. in [place]? --    No data found.  Updated Vital Signs BP (!) 79/47 (BP Location: Left Arm)   Pulse (!) 122   Temp 98.5 F (36.9 C) (Oral)   LMP 10/27/2017 (Approximate)   SpO2 100%   Physical Exam  Constitutional: She is oriented to person, place, and time. She appears well-developed and well-nourished.  HENT:  Right Ear: External ear normal.  Left Ear: External ear normal.  Mouth/Throat: Oropharynx is clear and moist.  Eyes: Pupils are equal, round, and reactive to light. Conjunctivae are normal.  Neck: Normal range of motion. Neck supple.  Cardiovascular: Normal rate and normal heart sounds.  Mildly tachycardic  Pulmonary/Chest: Effort normal and breath sounds normal.  Abdominal: Soft. Bowel sounds are normal. There is no tenderness.  Musculoskeletal: Normal range of motion.  Neurological: She is alert and oriented to person, place, and time.  Skin: Skin is warm and dry.  Nursing note and vitals reviewed.    UC Treatments / Results  Labs (all labs ordered are listed, but only abnormal results are displayed) Labs Reviewed  POCT I-STAT, CHEM 8 - Abnormal; Notable for the following components:      Result Value   Creatinine, Ser 1.70 (*)    Calcium, Ion 1.13 (*)    Hemoglobin 11.2 (*)    HCT 33.0 (*)    All other components within normal limits  CBC    EKG None  Radiology No results found.  Procedures Procedures (including critical care time)  Medications Ordered in UC Medications  0.9 %  sodium chloride infusion ( Intravenous New Bag/Given 11/02/17 2012)    Initial Impression / Assessment and Plan / UC Course  I have reviewed the triage vital signs and the nursing notes.  Pertinent labs & imaging results that were available during my care of the patient were reviewed by  me and considered in my medical decision making (see chart for details).    Final Clinical Impressions(s) / UC Diagnoses   Final diagnoses:  Dehydration  Azotemia  Anemia of chronic disease     Discharge Instructions     Rest and continue the fluids.  I am taking out of work for 2 days.  If your symptoms are not improving over the next 12 hours, go to the emergency department.    ED Prescriptions    None  Controlled Substance Prescriptions [place] Controlled Substance Registry consulted? Not Applicable   Robyn Haber, MD 11/02/17 2028

## 2017-11-02 NOTE — ED Triage Notes (Signed)
Pt reports fatigue, dizziness, and lower extremity numbness and tingling that started after she got her flu shot last week.

## 2017-11-02 NOTE — Discharge Instructions (Signed)
Rest and continue the fluids.  I am taking out of work for 2 days.  If your symptoms are not improving over the next 12 hours, go to the emergency department.

## 2017-11-05 ENCOUNTER — Emergency Department (HOSPITAL_COMMUNITY)
Admission: EM | Admit: 2017-11-05 | Discharge: 2017-11-06 | Disposition: A | Discharge status: Home / Self Care | Payer: Medicaid Other | Attending: Emergency Medicine | Admitting: Emergency Medicine

## 2017-11-05 ENCOUNTER — Encounter (HOSPITAL_COMMUNITY): Payer: Self-pay

## 2017-11-05 DIAGNOSIS — R350 Frequency of micturition: Secondary | ICD-10-CM | POA: Diagnosis not present

## 2017-11-05 DIAGNOSIS — R1030 Lower abdominal pain, unspecified: Secondary | ICD-10-CM | POA: Insufficient documentation

## 2017-11-05 DIAGNOSIS — R42 Dizziness and giddiness: Secondary | ICD-10-CM | POA: Diagnosis present

## 2017-11-05 DIAGNOSIS — R55 Syncope and collapse: Secondary | ICD-10-CM

## 2017-11-05 DIAGNOSIS — E860 Dehydration: Secondary | ICD-10-CM | POA: Insufficient documentation

## 2017-11-05 LAB — URINALYSIS, ROUTINE W REFLEX MICROSCOPIC
BACTERIA UA: NONE SEEN
Bilirubin Urine: NEGATIVE
Glucose, UA: NEGATIVE mg/dL
KETONES UR: NEGATIVE mg/dL
Leukocytes, UA: NEGATIVE
Nitrite: NEGATIVE
PROTEIN: 30 mg/dL — AB
Specific Gravity, Urine: 1.016 (ref 1.005–1.030)
pH: 5 (ref 5.0–8.0)

## 2017-11-05 LAB — CBC WITH DIFFERENTIAL/PLATELET
ABS IMMATURE GRANULOCYTES: 0 10*3/uL (ref 0.0–0.1)
Basophils Absolute: 0 10*3/uL (ref 0.0–0.1)
Basophils Relative: 0 %
Eosinophils Absolute: 0.1 10*3/uL (ref 0.0–0.7)
Eosinophils Relative: 1 %
HCT: 32.3 % — ABNORMAL LOW (ref 36.0–46.0)
HEMOGLOBIN: 9.8 g/dL — AB (ref 12.0–15.0)
Immature Granulocytes: 0 %
LYMPHS PCT: 14 %
Lymphs Abs: 0.8 10*3/uL (ref 0.7–4.0)
MCH: 26.7 pg (ref 26.0–34.0)
MCHC: 30.3 g/dL (ref 30.0–36.0)
MCV: 88 fL (ref 78.0–100.0)
MONO ABS: 0.4 10*3/uL (ref 0.1–1.0)
MONOS PCT: 7 %
NEUTROS ABS: 4.5 10*3/uL (ref 1.7–7.7)
Neutrophils Relative %: 78 %
Platelets: 191 10*3/uL (ref 150–400)
RBC: 3.67 MIL/uL — ABNORMAL LOW (ref 3.87–5.11)
RDW: 14.6 % (ref 11.5–15.5)
WBC: 5.7 10*3/uL (ref 4.0–10.5)

## 2017-11-05 LAB — COMPREHENSIVE METABOLIC PANEL
ALK PHOS: 61 U/L (ref 38–126)
ALT: 13 U/L (ref 0–44)
ANION GAP: 7 (ref 5–15)
AST: 19 U/L (ref 15–41)
Albumin: 3.3 g/dL — ABNORMAL LOW (ref 3.5–5.0)
BILIRUBIN TOTAL: 0.3 mg/dL (ref 0.3–1.2)
BUN: 14 mg/dL (ref 6–20)
CALCIUM: 9.5 mg/dL (ref 8.9–10.3)
CO2: 25 mmol/L (ref 22–32)
Chloride: 103 mmol/L (ref 98–111)
Creatinine, Ser: 1.19 mg/dL — ABNORMAL HIGH (ref 0.44–1.00)
GFR calc Af Amer: >60 mL/min (ref >60)
GFR, EST NON AFRICAN AMERICAN: 56 mL/min — AB (ref >60)
GLUCOSE: 102 mg/dL — AB (ref 70–99)
Potassium: 4.6 mmol/L (ref 3.5–5.1)
Sodium: 135 mmol/L (ref 135–145)
TOTAL PROTEIN: 8.3 g/dL — AB (ref 6.5–8.1)

## 2017-11-05 LAB — I-STAT BETA HCG BLOOD, ED (MC, WL, AP ONLY): I-stat hCG, quantitative: <5 m[IU]/mL (ref <5)

## 2017-11-05 MED ORDER — SODIUM CHLORIDE 0.9 % IV BOLUS
1000.0000 mL | Freq: Once | INTRAVENOUS | Status: AC
  Administered 2017-11-05: 1000 mL via INTRAVENOUS

## 2017-11-05 MED ORDER — MECLIZINE HCL 25 MG PO TABS
25.0000 mg | ORAL_TABLET | Freq: Once | ORAL | Status: AC
Start: 2017-11-05 — End: 2017-11-05
  Administered 2017-11-05: 25 mg via ORAL
  Filled 2017-11-05: qty 1

## 2017-11-05 MED ORDER — MECLIZINE HCL 25 MG PO TABS: 25.0000 mg | ORAL_TABLET | Freq: Three times a day (TID) | ORAL | 0 refills | Status: DC | PRN

## 2017-11-05 NOTE — ED Notes (Signed)
2 unsuccessful iv attempts by this RN, another RN to try

## 2017-11-05 NOTE — ED Provider Notes (Signed)
Patient placed in Quick Look pathway, seen and evaluated   Chief Complaint: Generalized weakness, lightheadedness  HPI: Patient presents with 1 to 2-week history of lightheadedness, fatigue, generalized aches and polyarthralgias since receiving her flu vaccine.  Notes aching of her low back and abdomen.  Went to see urgent care on 11/02/2017 and was told to come to the ED if symptoms did not improve.  Denies fevers, chest pain, or shortness of breath.  Had a few episodes of near syncope.  ROS: Weakness, myalgias, lightheadedness  Physical Exam:   Gen: No distress  Neuro: Awake and Alert  Skin: Warm    Focused Exam: Fluent speech, no facial droop.  Lungs clear to auscultation bilaterally.  Heart regular rate and rhythm.  Abdomen soft and nontender.  No tenderness to palpation of the lumbar spine or paralumbar musculature.   Initiation of care has begun. The patient has been counseled on the process, plan, and necessity for staying for the completion/evaluation, and the remainder of the medical screening examination    Renita Papa, PA-C 11/05/17 1449    Valarie Merino, MD 11/06/17 1152

## 2017-11-05 NOTE — ED Provider Notes (Signed)
[facility] EMERGENCY DEPARTMENT Provider Note   CSN: [id number] Arrival date & time: 11/05/17  1428     History   Chief Complaint No chief complaint on file.   HPI Christine Todd is a 42 y.o. female.  The history is provided by the patient. No language interpreter was used.     42 year old female with history of anemia, lupus, GERD, presenting complaining of lightheadedness.  Patient reports she received a flu shot a week ago.  Shortly after, patient states I just do not feel right" since then, she has endorsed bouts of lightheadedness dizziness worsening with positional change, that has been waxing waning.  She endorsed decrease in appetite and having been drinking much.  She endorsed a mild headache yesterday improved with Tylenol.  She notices symptoms more prominent when she is stands for a prolonged period of time.  She went to urgent care yesterday for her symptoms, and receive IV fluid and was told to come to ER if her condition worsen.  Today, she still endorse feeling lightheadedness and decided to come here.  She does endorse increase urination without burning on urination or urgency.  She denies any cold symptoms.  Denies any chest pain shortness of breath.  Endorse some mild lower abdominal discomfort which is waxing waning and minimal at this time.  He denies any medication changes.  She denies any abnormal bleeding.  No report of vomiting or diarrhea but does endorse mild nausea.  Past Medical History:  Diagnosis Date  . Anemia   . Arthritis    "hands and feet" (07/04/2014)  . GERD (gastroesophageal reflux disease)   . Headache    "weekly" (07/04/2014)  . History of blood transfusion 07/04/2014   "related to anemia"  . Pneumonia "several times"  . 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 SLE (systemic lupus erythematosus) ([place])     Patient Active Problem List   Diagnosis Date Noted  . Symptomatic anemia 07/04/2014  . Benign essential HTN  07/04/2014  . Hypokalemia 07/04/2014  . FOOT PAIN, LEFT 12/10/2009  . KNEE PAIN 11/13/2008  . SKIN RASH 11/13/2008  . UTI 11/09/2008  . Iron deficiency anemia 11/01/2008  . MICROSCOPIC HEMATURIA 11/01/2008  . INSOMNIA 07/31/2008  . HEADACHE 07/31/2008  . ABSCESS, SKIN 04/27/2008  . FREQUENCY, URINARY 03/01/2008  . OVARIAN CYST, RUPTURED 11/08/2007  . GERD 09/24/2007  . SLE 11/25/2006  . CANDIDIASIS 11/18/2006  . SORE THROAT 11/18/2006  . ABNORMAL RESULT, FUNCTION STUDY, LIVER 11/18/2006    Past Surgical History:  Procedure Laterality Date  . DILATION AND CURETTAGE OF UTERUS  2009  . RENAL BIOPSY, PERCUTANEOUS  ?2013     OB History   None      Home Medications    Prior to Admission medications   Medication Sig Start Date End Date Taking? Authorizing Provider  hydroxychloroquine (PLAQUENIL) 200 MG tablet Take by mouth. 09/28/17   [provider]  ibuprofen (ADVIL,MOTRIN) 600 MG tablet Take 1 tablet (600 mg total) by mouth every 6 (six) hours as needed for moderate pain. 01/16/16   Melony Overly, MD  lisinopril (PRINIVIL,ZESTRIL) 5 MG tablet Take by mouth. 10/26/17   [provider]  Vitamin D, Ergocalciferol, (DRISDOL) 50000 units CAPS capsule Take by mouth. 10/29/17 01/27/18  [provider]    Family History Family History  Problem Relation Age of Onset  . Diabetes Other   . Cancer Other   . Hypertension Other   .  Diabetes Father     Social History Social History   Tobacco Use  . Smoking status: Never Smoker  . Smokeless tobacco: Never Used  Substance Use Topics  . Alcohol use: Yes    Comment: occasionally  . Drug use: No     Allergies   Sulfa antibiotics and Sulfonamide derivatives   Review of Systems Review of Systems  All other systems reviewed and are negative.    Physical Exam Updated Vital Signs BP 117/83   Pulse 77   Temp 99.2 F (37.3 C) (Oral)   Resp 19   Ht 5\' 7"  (1.702 m)   Wt 72.6 kg   LMP  10/27/2017 (Approximate)   SpO2 100%   BMI 25.06 kg/m   Physical Exam  Constitutional: She is oriented to person, place, and time. She appears well-developed and well-nourished. No distress.  HENT:  Head: Atraumatic.  Right Ear: External ear normal.  Left Ear: External ear normal.  No cerumen impaction  Eyes: Pupils are equal, round, and reactive to light. Conjunctivae and EOM are normal.  No nystagmus  Neck: Normal range of motion. Neck supple.  Cardiovascular: Normal rate and regular rhythm.  Pulmonary/Chest: Effort normal and breath sounds normal.  Abdominal: Soft. Bowel sounds are normal. She exhibits no distension. There is no tenderness.  Musculoskeletal:  Equal strength to all 4 extremities.  Neurological: She is alert and oriented to person, place, and time. GCS eye subscore is 4. GCS verbal subscore is 5. GCS motor subscore is 6.  Skin: No rash noted.  Psychiatric: She has a normal mood and affect.  Nursing note and vitals reviewed.    ED Treatments / Results  Labs (all labs ordered are listed, but only abnormal results are displayed) Labs Reviewed  CBC WITH DIFFERENTIAL/PLATELET - Abnormal; Notable for the following components:      Result Value   RBC 3.67 (*)    Hemoglobin 9.8 (*)    HCT 32.3 (*)    All other components within normal limits  COMPREHENSIVE METABOLIC PANEL - Abnormal; Notable for the following components:   Glucose, Bld 102 (*)    Creatinine, Ser 1.19 (*)    Total Protein 8.3 (*)    Albumin 3.3 (*)    GFR calc non Af Amer 56 (*)    All other components within normal limits  URINALYSIS, ROUTINE W REFLEX MICROSCOPIC - Abnormal; Notable for the following components:   Hgb urine dipstick SMALL (*)    Protein, ur 30 (*)    All other components within normal limits  I-STAT BETA HCG BLOOD, ED ([facility], [facility], [facility] ONLY)    EKG EKG Interpretation  Date/Time:  Friday November 05 2017 14:58:51 EDT Ventricular Rate:  100 PR Interval:  116 QRS  Duration: 86 QT Interval:  338 QTC Calculation: 436 R Axis:   24 Text Interpretation:  Normal sinus rhythm Right atrial enlargement Septal infarct , age undetermined Abnormal ECG no STEMI Confirmed by Antony Blackbird [phone]) on 11/06/2017 2:36:31 PM   ED ECG REPORT   Date: 11/05/2017  Rate: 100  Rhythm: normal sinus rhythm  QRS Axis: normal  Intervals: normal  ST/T Wave abnormalities: nonspecific ST changes  Conduction Disutrbances:none  Narrative Interpretation:   Old EKG Reviewed: none available  I have personally reviewed the EKG tracing and agree with the computerized printout as noted.   Radiology No results found.  Procedures Procedures (including critical care time)  Medications Ordered in ED Medications  sodium chloride 0.9 % bolus 1,000  mL (has no administration in time range)  meclizine (ANTIVERT) tablet 25 mg (25 mg Oral Given 11/05/17 2007)     Initial Impression / Assessment and Plan / ED Course  I have reviewed the triage vital signs and the nursing notes.  Pertinent labs & imaging results that were available during my care of the patient were reviewed by me and considered in my medical decision making (see chart for details).     BP (!) 144/93   Pulse 82   Temp 99.2 F (37.3 C) (Oral)   Resp 18   Ht 5\' 7"  (1.702 m)   Wt 72.6 kg   LMP 10/27/2017 (Approximate)   SpO2 100%   BMI 25.06 kg/m    Final Clinical Impressions(s) / ED Diagnoses   Final diagnoses:  Near syncope  Dehydration    ED Discharge Orders         Ordered    meclizine (ANTIVERT) 25 MG tablet  3 times daily PRN     11/05/17 2335         8:24 PM Patient here with complaints of lightheadedness for nearly a week after receiving flu shot.  Endorsed some mild lower abdominal discomfort which is minimal on my exam.  She does not have any strokelike symptoms.  She does have history of anemia and blood work shows a mild anemia with hemoglobin of 9.8.  Very mild dehydration with a  creatinine of 1.19, IV fluid given.  Pt received IVF and felt better.  Stable for discharge.  Return precaution discussed.    Domenic Moras, PA-C 11/06/17 [place], [place], DO 11/08/17 1207

## 2017-11-05 NOTE — ED Notes (Signed)
ED Provider at bedside. 

## 2017-11-05 NOTE — ED Triage Notes (Addendum)
Pt presents with onset of weakness, dizziness, aches since getting flu vaccine last week.  Pt reports symptoms have worsened to the point of near-syncope yesterday and today.  Pt reports headache yesterday that was relieved with tylenol.  Pt was seen at urgent care and reports she was told she was dehydrated and to come here if no improvement.

## 2017-11-05 NOTE — ED Notes (Signed)
Pt ambulatory to the restroom.  

## 2017-11-05 NOTE — ED Notes (Signed)
Results reviewed, no acuity change at this time.

## 2017-11-06 NOTE — ED Notes (Signed)
Patient left at this time with all belongings. 

## 2017-12-26 ENCOUNTER — Encounter (HOSPITAL_COMMUNITY): Payer: Self-pay

## 2017-12-26 ENCOUNTER — Emergency Department (HOSPITAL_COMMUNITY): Payer: Medicaid Other

## 2017-12-26 ENCOUNTER — Emergency Department (HOSPITAL_COMMUNITY)
Admission: EM | Admit: 2017-12-26 | Discharge: 2017-12-26 | Disposition: A | Discharge status: Home / Self Care | Payer: Medicaid Other | Attending: Emergency Medicine | Admitting: Emergency Medicine

## 2017-12-26 ENCOUNTER — Other Ambulatory Visit: Payer: Self-pay

## 2017-12-26 DIAGNOSIS — I10 Essential (primary) hypertension: Secondary | ICD-10-CM | POA: Diagnosis not present

## 2017-12-26 DIAGNOSIS — R51 Headache: Secondary | ICD-10-CM | POA: Diagnosis not present

## 2017-12-26 DIAGNOSIS — R1011 Right upper quadrant pain: Secondary | ICD-10-CM | POA: Diagnosis present

## 2017-12-26 DIAGNOSIS — R519 Headache, unspecified: Secondary | ICD-10-CM

## 2017-12-26 DIAGNOSIS — Z79899 Other long term (current) drug therapy: Secondary | ICD-10-CM | POA: Diagnosis not present

## 2017-12-26 LAB — I-STAT BETA HCG BLOOD, ED (MC, WL, AP ONLY): I-stat hCG, quantitative: <5 m[IU]/mL (ref <5)

## 2017-12-26 LAB — CBC
HCT: 28.7 % — ABNORMAL LOW (ref 36.0–46.0)
Hemoglobin: 8.5 g/dL — ABNORMAL LOW (ref 12.0–15.0)
MCH: 26.5 pg (ref 26.0–34.0)
MCHC: 29.6 g/dL — ABNORMAL LOW (ref 30.0–36.0)
MCV: 89.4 fL (ref 80.0–100.0)
NRBC: 0 % (ref 0.0–0.2)
PLATELETS: 200 10*3/uL (ref 150–400)
RBC: 3.21 MIL/uL — AB (ref 3.87–5.11)
RDW: 15.7 % — AB (ref 11.5–15.5)
WBC: 4.2 10*3/uL (ref 4.0–10.5)

## 2017-12-26 LAB — LIPASE, BLOOD: Lipase: 28 U/L (ref 11–51)

## 2017-12-26 LAB — COMPREHENSIVE METABOLIC PANEL
ALT: 12 U/L (ref 0–44)
ANION GAP: 6 (ref 5–15)
AST: 17 U/L (ref 15–41)
Albumin: 3 g/dL — ABNORMAL LOW (ref 3.5–5.0)
Alkaline Phosphatase: 49 U/L (ref 38–126)
BILIRUBIN TOTAL: 0.4 mg/dL (ref 0.3–1.2)
BUN: 8 mg/dL (ref 6–20)
CO2: 25 mmol/L (ref 22–32)
Calcium: 8.4 mg/dL — ABNORMAL LOW (ref 8.9–10.3)
Chloride: 107 mmol/L (ref 98–111)
Creatinine, Ser: 0.85 mg/dL (ref 0.44–1.00)
GFR calc non Af Amer: >60 mL/min (ref >60)
GLUCOSE: 89 mg/dL (ref 70–99)
Potassium: 3 mmol/L — ABNORMAL LOW (ref 3.5–5.1)
Sodium: 138 mmol/L (ref 135–145)
TOTAL PROTEIN: 7.8 g/dL (ref 6.5–8.1)

## 2017-12-26 LAB — URINALYSIS, ROUTINE W REFLEX MICROSCOPIC
Bilirubin Urine: NEGATIVE
GLUCOSE, UA: NEGATIVE mg/dL
KETONES UR: NEGATIVE mg/dL
Leukocytes, UA: NEGATIVE
Nitrite: NEGATIVE
PH: 6 (ref 5.0–8.0)
Protein, ur: 30 mg/dL — AB
SPECIFIC GRAVITY, URINE: 1.015 (ref 1.005–1.030)

## 2017-12-26 MED ORDER — METOCLOPRAMIDE HCL 5 MG/ML IJ SOLN
10.0000 mg | INTRAMUSCULAR | Status: AC
  Administered 2017-12-26: 10 mg via INTRAVENOUS
  Filled 2017-12-26: qty 2

## 2017-12-26 MED ORDER — POTASSIUM CHLORIDE CRYS ER 20 MEQ PO TBCR
40.0000 meq | EXTENDED_RELEASE_TABLET | Freq: Once | ORAL | Status: AC
  Administered 2017-12-26: 40 meq via ORAL
  Filled 2017-12-26: qty 2

## 2017-12-26 MED ORDER — DICYCLOMINE HCL 10 MG PO CAPS
10.0000 mg | ORAL_CAPSULE | Freq: Once | ORAL | Status: AC
  Administered 2017-12-26: 10 mg via ORAL
  Filled 2017-12-26: qty 1

## 2017-12-26 MED ORDER — KETOROLAC TROMETHAMINE 30 MG/ML IJ SOLN
30.0000 mg | Freq: Once | INTRAMUSCULAR | Status: AC
  Administered 2017-12-26: 30 mg via INTRAVENOUS
  Filled 2017-12-26: qty 1

## 2017-12-26 MED ORDER — DIPHENHYDRAMINE HCL 50 MG/ML IJ SOLN
12.5000 mg | Freq: Once | INTRAMUSCULAR | Status: AC
  Administered 2017-12-26: 12.5 mg via INTRAVENOUS
  Filled 2017-12-26: qty 1

## 2017-12-26 MED ORDER — DICYCLOMINE HCL 20 MG PO TABS: 20.0000 mg | ORAL_TABLET | Freq: Two times a day (BID) | ORAL | 0 refills | Status: DC | PRN

## 2017-12-26 MED ORDER — SODIUM CHLORIDE 0.9 % IV BOLUS
500.0000 mL | Freq: Once | INTRAVENOUS | Status: AC
  Administered 2017-12-26: 500 mL via INTRAVENOUS

## 2017-12-26 NOTE — ED Notes (Signed)
PT states understanding of care given, follow up care, and medication prescribed. PT ambulated from ED to car with a steady gait. 

## 2017-12-26 NOTE — Discharge Instructions (Signed)
Continue your daily prescribed medications.  We recommend the use of Excedrin Migraine for persistent headaches.  You have been prescribed Bentyl to take as needed for persistent abdominal pain or cramping.  You were found to have some thickening to your gallbladder wall without any evidence of gallstones or other infectious process.  Because of this, you may find that your abdominal pain may worsen when eating fatty or greasy foods.  Try to avoid these.  If your abdominal pain persists, be reevaluated by your primary care doctor.  You may also benefit from consultation with a surgeon.  Return to the ED for any new or concerning symptoms.

## 2017-12-26 NOTE — ED Triage Notes (Signed)
Pt states she has been having left upper and right upper quadrant pain in the abdomin that radiates to her back. Pt states this has been going on for over a week and also been having headaches/migraines as well with the stomach pain.

## 2017-12-26 NOTE — ED Provider Notes (Signed)
[facility] EMERGENCY DEPARTMENT Provider Note   CSN: [id number] Arrival date & time: 12/26/17  0232     History   Chief Complaint Chief Complaint  Patient presents with  . Abdominal Pain  . Migraine    HPI Christine Todd is a 42 y.o. female.   42 year old female with a history of anemia, esophageal reflux, SLE presents to the emergency department for evaluation of multiple complaints.  She states that she began to notice increased stooling 10 days ago.  She describes her stool is softer as well as clay colored.  This became associated with upper abdominal pain a few days later.  She notes the discomfort to be primarily in her epigastrium and her right upper abdomen.  This is intermittent and without modifying factors.  She has not had any associated nausea, vomiting, fevers, urinary symptoms, vaginal complaints.  She has not taken any medications for symptoms.  Is also been experiencing a headache for the past week.  Headache has been waxing and waning in severity and migratory.  It began in her occiput, but is currently located on the top of her head.  She saw her primary care doctor 5 days ago and received a shot of Toradol which did not provide significant relief.  She states that her headache onset felt similar to her headaches associated with weather changes.  The history is provided by the patient. No language interpreter was used.  Abdominal Pain    Migraine  Associated symptoms include abdominal pain.    Past Medical History:  Diagnosis Date  . Anemia   . Arthritis    "hands and feet" (07/04/2014)  . GERD (gastroesophageal reflux disease)   . Headache    "weekly" (07/04/2014)  . History of blood transfusion 07/04/2014   "related to anemia"  . Pneumonia "several times"  . 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 SLE (systemic lupus erythematosus) ([place])     Patient Active Problem List   Diagnosis Date Noted  . Symptomatic anemia 07/04/2014  .  Benign essential HTN 07/04/2014  . Hypokalemia 07/04/2014  . FOOT PAIN, LEFT 12/10/2009  . KNEE PAIN 11/13/2008  . SKIN RASH 11/13/2008  . UTI 11/09/2008  . Iron deficiency anemia 11/01/2008  . MICROSCOPIC HEMATURIA 11/01/2008  . INSOMNIA 07/31/2008  . HEADACHE 07/31/2008  . ABSCESS, SKIN 04/27/2008  . FREQUENCY, URINARY 03/01/2008  . OVARIAN CYST, RUPTURED 11/08/2007  . GERD 09/24/2007  . SLE 11/25/2006  . CANDIDIASIS 11/18/2006  . SORE THROAT 11/18/2006  . ABNORMAL RESULT, FUNCTION STUDY, LIVER 11/18/2006    Past Surgical History:  Procedure Laterality Date  . DILATION AND CURETTAGE OF UTERUS  2009  . RENAL BIOPSY, PERCUTANEOUS  ?2013     OB History   None      Home Medications    Prior to Admission medications   Medication Sig Start Date End Date Taking? Authorizing Provider  hydroxychloroquine (PLAQUENIL) 200 MG tablet Take by mouth. 09/28/17   [provider]  ibuprofen (ADVIL,MOTRIN) 600 MG tablet Take 1 tablet (600 mg total) by mouth every 6 (six) hours as needed for moderate pain. 01/16/16   Melony Overly, MD  lisinopril (PRINIVIL,ZESTRIL) 5 MG tablet Take by mouth. 10/26/17   [provider]  meclizine (ANTIVERT) 25 MG tablet Take 1 tablet (25 mg total) by mouth 3 (three) times daily as needed for dizziness. 11/05/17   Domenic Moras, PA-C  Vitamin D, Ergocalciferol, (DRISDOL) 50000 units CAPS  capsule Take by mouth. 10/29/17 01/27/18  [provider]    Family History Family History  Problem Relation Age of Onset  . Diabetes Other   . Cancer Other   . Hypertension Other   . Diabetes Father     Social History Social History   Tobacco Use  . Smoking status: Never Smoker  . Smokeless tobacco: Never Used  Substance Use Topics  . Alcohol use: Yes    Comment: occasionally  . Drug use: No     Allergies   Sulfa antibiotics and Sulfonamide derivatives   Review of Systems Review of Systems  Gastrointestinal: Positive for  abdominal pain.  Ten systems reviewed and are negative for acute change, except as noted in the HPI.    Physical Exam Updated Vital Signs BP (!) 174/98   Pulse 78   Temp 99.4 F (37.4 C) (Oral)   Resp 16   Ht 5\' 7"  (1.702 m)   Wt 77.1 kg   LMP 12/19/2017   SpO2 99%   BMI 26.63 kg/m   Physical Exam  Constitutional: She is oriented to person, place, and time. She appears well-developed and well-nourished. No distress.  Nontoxic appearing and in no acute distress  HENT:  Head: Normocephalic and atraumatic.  Symmetric rise of the uvula with phonation.  Tolerating secretions without difficulty.  No tripoding or stridor.  No voice muffling.  Eyes: Conjunctivae and EOM are normal. No scleral icterus.  Neck: Normal range of motion.  No meningismus  Cardiovascular: Normal rate, regular rhythm and intact distal pulses.  Pulmonary/Chest: Effort normal. No stridor. No respiratory distress.  Respirations even and unlabored  Abdominal:  Epigastric and right upper quadrant tenderness with mild voluntary guarding.  Abdomen soft.  No peritoneal signs or palpable masses.  Musculoskeletal: Normal range of motion.  Lymphadenopathy:    She has cervical adenopathy (mild anterior cervical).  Neurological: She is alert and oriented to person, place, and time. No cranial nerve deficit. She exhibits normal muscle tone. Coordination normal.  GCS 15.  Moving all extremities spontaneously.  No focal deficits noted.  Skin: Skin is warm and dry. No rash noted. She is not diaphoretic. No erythema. No pallor.  Psychiatric: She has a normal mood and affect. Her behavior is normal.  Nursing note and vitals reviewed.    ED Treatments / Results  Labs (all labs ordered are listed, but only abnormal results are displayed) Labs Reviewed  COMPREHENSIVE METABOLIC PANEL - Abnormal; Notable for the following components:      Result Value   Potassium 3.0 (*)    Calcium 8.4 (*)    Albumin 3.0 (*)    All other  components within normal limits  CBC - Abnormal; Notable for the following components:   RBC 3.21 (*)    Hemoglobin 8.5 (*)    HCT 28.7 (*)    MCHC 29.6 (*)    RDW 15.7 (*)    All other components within normal limits  URINALYSIS, ROUTINE W REFLEX MICROSCOPIC - Abnormal; Notable for the following components:   Hgb urine dipstick MODERATE (*)    Protein, ur 30 (*)    Bacteria, UA RARE (*)    All other components within normal limits  LIPASE, BLOOD  I-STAT BETA HCG BLOOD, ED ([facility], [facility], [facility] ONLY)    EKG None  Radiology US Abdomen Limited  Result Date: 12/26/2017 CLINICAL DATA:  Right upper quadrant pain EXAM: ULTRASOUND ABDOMEN LIMITED RIGHT UPPER QUADRANT COMPARISON:  None. FINDINGS: Gallbladder: Mild gallbladder  wall thickening. No pericholecystic fluid. No cholelithiasis. Sonographic Percell Miller sign could not be assessed because the patient had received pain medication. Common bile duct: Diameter: 2 mm Liver: No focal lesion identified. Within normal limits in parenchymal echogenicity. Portal vein is patent on color Doppler imaging with normal direction of blood flow towards the liver. IMPRESSION: Nonspecific gallbladder wall edema without cholelithiasis. Electronically Signed   By: Ulyses Jarred M.D.   On: 12/26/2017 04:08    Procedures Procedures (including critical care time)  Medications Ordered in ED Medications  dicyclomine (BENTYL) capsule 10 mg (has no administration in time range)  ketorolac (TORADOL) 30 MG/ML injection 30 mg (30 mg Intravenous Given 12/26/17 0341)  metoCLOPramide (REGLAN) injection 10 mg (10 mg Intravenous Given 12/26/17 0340)  diphenhydrAMINE (BENADRYL) injection 12.5 mg (12.5 mg Intravenous Given 12/26/17 0341)  sodium chloride 0.9 % bolus 500 mL (0 mLs Intravenous Stopped 12/26/17 0534)  potassium chloride SA (K-DUR,KLOR-CON) CR tablet 40 mEq (40 mEq Oral Given 12/26/17 0411)    6:18 AM Patient reassessed.  States headache has resolved.  Notes abdominal  discomfort to only be mildly improved.  She has a stable abdominal examination, now with no voluntary guarding; still no peritoneal signs.   Initial Impression / Assessment and Plan / ED Course  I have reviewed the triage vital signs and the nursing notes.  Pertinent labs & imaging results that were available during my care of the patient were reviewed by me and considered in my medical decision making (see chart for details).     42 year old female presents to the emergency department for multiple complaints.  She notes stooling changes as well as right upper quadrant abdominal pain.  Mild tenderness noted in the epigastric region as well as the right upper quadrant.  Negative Murphy sign.  Laboratory evaluation generally reassuring.  She does not have a leukocytosis or fever today.  Lower suspicion for infectious etiology.  She has an anemia with hemoglobin of 8.5.  This is fairly consistent with her baseline compared to prior evaluations.  Liver and kidney function preserved.  Urinalysis without evidence of UTI.  Patient underwent right upper quadrant ultrasound to evaluate her gallbladder.  She has no evidence of cholelithiasis.  There is nonspecific edema to the wall of her gallbladder.  This can be followed further by her primary care doctor in an outpatient setting.  Patient also noted a headache on arrival.  She has a nonfocal, reassuring neurologic exam.  Headache has been present for the past week.  Symptoms have completely resolved following migraine cocktail.  I do not believe further emergent work-up is indicated at this time.  She has been encouraged to follow-up with her primary care doctor.  Prescription of Bentyl provided should abdominal discomfort persist.  Return precautions discussed and provided. Patient discharged in stable condition with no unaddressed concerns.   Final Clinical Impressions(s) / ED Diagnoses   Final diagnoses:  Right upper quadrant abdominal pain  Bad  headache    ED Discharge Orders         Ordered    dicyclomine (BENTYL) 20 MG tablet  Every 12 hours PRN     12/26/17 0634           Antonietta Breach, PA-C 12/30/17 0458    Fatima Blank, MD 01/03/18 1925

## 2017-12-28 ENCOUNTER — Emergency Department (HOSPITAL_COMMUNITY): Payer: Medicaid Other

## 2017-12-28 ENCOUNTER — Encounter (HOSPITAL_COMMUNITY): Payer: Self-pay | Admitting: Emergency Medicine

## 2017-12-28 ENCOUNTER — Emergency Department (HOSPITAL_COMMUNITY)
Admission: EM | Admit: 2017-12-28 | Discharge: 2017-12-28 | Disposition: A | Discharge status: Home / Self Care | Payer: Medicaid Other | Attending: Emergency Medicine | Admitting: Emergency Medicine

## 2017-12-28 DIAGNOSIS — Z8739 Personal history of other diseases of the musculoskeletal system and connective tissue: Secondary | ICD-10-CM | POA: Insufficient documentation

## 2017-12-28 DIAGNOSIS — R0789 Other chest pain: Secondary | ICD-10-CM | POA: Diagnosis not present

## 2017-12-28 DIAGNOSIS — I10 Essential (primary) hypertension: Secondary | ICD-10-CM | POA: Insufficient documentation

## 2017-12-28 DIAGNOSIS — M329 Systemic lupus erythematosus, unspecified: Secondary | ICD-10-CM

## 2017-12-28 DIAGNOSIS — J90 Pleural effusion, not elsewhere classified: Secondary | ICD-10-CM | POA: Diagnosis not present

## 2017-12-28 DIAGNOSIS — Z79899 Other long term (current) drug therapy: Secondary | ICD-10-CM | POA: Diagnosis not present

## 2017-12-28 DIAGNOSIS — J8410 Pulmonary fibrosis, unspecified: Secondary | ICD-10-CM | POA: Insufficient documentation

## 2017-12-28 DIAGNOSIS — IMO0002 Reserved for concepts with insufficient information to code with codable children: Secondary | ICD-10-CM

## 2017-12-28 HISTORY — DX: Systemic lupus erythematosus, unspecified: M32.9

## 2017-12-28 LAB — BASIC METABOLIC PANEL
Anion gap: 6 (ref 5–15)
BUN: 8 mg/dL (ref 6–20)
CO2: 23 mmol/L (ref 22–32)
Calcium: 8.4 mg/dL — ABNORMAL LOW (ref 8.9–10.3)
Chloride: 108 mmol/L (ref 98–111)
Creatinine, Ser: 0.81 mg/dL (ref 0.44–1.00)
GFR calc Af Amer: >60 mL/min (ref >60)
Glucose, Bld: 109 mg/dL — ABNORMAL HIGH (ref 70–99)
Potassium: 3 mmol/L — ABNORMAL LOW (ref 3.5–5.1)
SODIUM: 137 mmol/L (ref 135–145)

## 2017-12-28 LAB — I-STAT BETA HCG BLOOD, ED (MC, WL, AP ONLY): I-stat hCG, quantitative: <5 m[IU]/mL (ref <5)

## 2017-12-28 LAB — CBC
HCT: 28.1 % — ABNORMAL LOW (ref 36.0–46.0)
Hemoglobin: 8 g/dL — ABNORMAL LOW (ref 12.0–15.0)
MCH: 25.7 pg — ABNORMAL LOW (ref 26.0–34.0)
MCHC: 28.5 g/dL — AB (ref 30.0–36.0)
MCV: 90.4 fL (ref 80.0–100.0)
Platelets: 207 10*3/uL (ref 150–400)
RBC: 3.11 MIL/uL — ABNORMAL LOW (ref 3.87–5.11)
RDW: 15.9 % — AB (ref 11.5–15.5)
WBC: 4.4 10*3/uL (ref 4.0–10.5)
nRBC: 0 % (ref 0.0–0.2)

## 2017-12-28 LAB — I-STAT TROPONIN, ED: TROPONIN I, POC: 0 ng/mL (ref 0.00–0.08)

## 2017-12-28 LAB — D-DIMER, QUANTITATIVE (NOT AT ARMC): D DIMER QUANT: 2.78 ug{FEU}/mL — AB (ref 0.00–0.50)

## 2017-12-28 MED ORDER — IBUPROFEN 400 MG PO TABS
400.0000 mg | ORAL_TABLET | Freq: Once | ORAL | Status: AC
  Administered 2017-12-28: 400 mg via ORAL
  Filled 2017-12-28: qty 1

## 2017-12-28 MED ORDER — ACETAMINOPHEN 500 MG PO TABS
1000.0000 mg | ORAL_TABLET | Freq: Once | ORAL | Status: AC
  Administered 2017-12-28: 1000 mg via ORAL
  Filled 2017-12-28: qty 2

## 2017-12-28 MED ORDER — IOPAMIDOL (ISOVUE-370) INJECTION 76%
100.0000 mL | Freq: Once | INTRAVENOUS | Status: AC | PRN
  Administered 2017-12-28: 100 mL via INTRAVENOUS

## 2017-12-28 MED ORDER — POTASSIUM CHLORIDE CRYS ER 20 MEQ PO TBCR
40.0000 meq | EXTENDED_RELEASE_TABLET | Freq: Once | ORAL | Status: AC
  Administered 2017-12-28: 40 meq via ORAL
  Filled 2017-12-28: qty 2

## 2017-12-28 MED ORDER — IOPAMIDOL (ISOVUE-370) INJECTION 76%
INTRAVENOUS | Status: AC
  Filled 2017-12-28: qty 100

## 2017-12-28 NOTE — ED Triage Notes (Signed)
Pt reports  Chest pain for the last couple of days, reports her PCP sent her for a CT of her chest. Hx lupus, reports she's been feeling bad for a couple weeks.

## 2017-12-28 NOTE — Discharge Instructions (Signed)
It was our pleasure to provide your ER care today - we hope that you feel better.  Your chest ct was read as follows: IMPRESSION: 1. No pulmonary embolus. 2. Small bilateral pleural effusions. 3. Stable moderate cardiomegaly. 4. Stable bibasilar pulmonary fibrosis. 5. Stable nonspecific bilateral axillary lymphadenopathy.  Follow up with your primary care doctor/rheumatologist in the coming week.  Also have your blood pressure rechecked then, as it is high tonight. Also follow up with pulmonary doctor in the next couple weeks.   Return to ER if worse, new symptoms, fevers, new or severe abdominal pain, recurrent/persistent chest pain, trouble breathing, other concern.

## 2017-12-28 NOTE — ED Provider Notes (Signed)
[facility] EMERGENCY DEPARTMENT Provider Note   CSN: [id number] Arrival date & time: 12/28/17  1535     History   Chief Complaint Chief Complaint  Patient presents with  . Chest Pain    HPI Christine Todd is a 42 y.o. female.  Patient with hx SLE presents indicating her doctor wanted her to get a ct of the chest. Patient notes recent eval for ruq pain. That pain has improved, but states mentioned to doctor that occasionally sharp pain right chest/costal margin. Pain sharp, not radiating, at rest. Not consistently pleuritic. Denies cough, sore throat or recurrent uri symptoms. No fever or chills. No exertional chest pain or discomfort. No unusual doe or fatigue. Denies leg pain or swelling. Denies hx dvt or pe. No fam hx premature cad.   The history is provided by the patient.  Chest Pain   Pertinent negatives include no back pain, no cough, no fever, no headaches, no palpitations, no shortness of breath and no vomiting.    Past Medical History:  Diagnosis Date  . Anemia   . Arthritis    "hands and feet" (07/04/2014)  . GERD (gastroesophageal reflux disease)   . Headache    "weekly" (07/04/2014)  . History of blood transfusion 07/04/2014   "related to anemia"  . Lupus ([place])   . Pneumonia "several times"  . 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 SLE (systemic lupus erythematosus) ([place])     Patient Active Problem List   Diagnosis Date Noted  . Symptomatic anemia 07/04/2014  . Benign essential HTN 07/04/2014  . Hypokalemia 07/04/2014  . FOOT PAIN, LEFT 12/10/2009  . KNEE PAIN 11/13/2008  . SKIN RASH 11/13/2008  . UTI 11/09/2008  . Iron deficiency anemia 11/01/2008  . MICROSCOPIC HEMATURIA 11/01/2008  . INSOMNIA 07/31/2008  . HEADACHE 07/31/2008  . ABSCESS, SKIN 04/27/2008  . FREQUENCY, URINARY 03/01/2008  . OVARIAN CYST, RUPTURED 11/08/2007  . GERD 09/24/2007  . SLE 11/25/2006  . CANDIDIASIS 11/18/2006  . SORE THROAT 11/18/2006  .  ABNORMAL RESULT, FUNCTION STUDY, LIVER 11/18/2006    Past Surgical History:  Procedure Laterality Date  . DILATION AND CURETTAGE OF UTERUS  2009  . RENAL BIOPSY, PERCUTANEOUS  ?2013     OB History   None      Home Medications    Prior to Admission medications   Medication Sig Start Date End Date Taking? Authorizing Provider  dicyclomine (BENTYL) 20 MG tablet Take 1 tablet (20 mg total) by mouth every 12 (twelve) hours as needed (for abdominal pain). 12/26/17  Yes Antonietta Breach, PA-C  hydroxychloroquine (PLAQUENIL) 200 MG tablet Take 200 mg by mouth daily.  09/28/17  Yes [provider]  lisinopril (PRINIVIL,ZESTRIL) 5 MG tablet Take 5 mg by mouth daily.  10/26/17  Yes [provider]  meclizine (ANTIVERT) 25 MG tablet Take 1 tablet (25 mg total) by mouth 3 (three) times daily as needed for dizziness. 11/05/17  Yes Domenic Moras, PA-C  Vitamin D, Ergocalciferol, (DRISDOL) 50000 units CAPS capsule Take by mouth. 10/29/17 01/27/18 Yes [provider]  ibuprofen (ADVIL,MOTRIN) 600 MG tablet Take 1 tablet (600 mg total) by mouth every 6 (six) hours as needed for moderate pain. Patient not taking: Reported on 12/28/2017 01/16/16   Melony Overly, MD    Family History Family History  Problem Relation Age of Onset  . Diabetes Other   . Cancer Other   . Hypertension Other   .  Diabetes Father     Social History Social History   Tobacco Use  . Smoking status: Never Smoker  . Smokeless tobacco: Never Used  Substance Use Topics  . Alcohol use: Yes    Comment: occasionally  . Drug use: No     Allergies   Sulfa antibiotics and Sulfonamide derivatives   Review of Systems Review of Systems  Constitutional: Negative for chills and fever.  HENT: Negative for sore throat.   Eyes: Negative for redness.  Respiratory: Negative for cough and shortness of breath.   Cardiovascular: Positive for chest pain. Negative for palpitations and leg swelling.    Gastrointestinal: Negative for diarrhea and vomiting.  Genitourinary: Negative for dysuria, flank pain and hematuria.  Musculoskeletal: Negative for back pain and neck pain.  Skin: Negative for rash.  Neurological: Negative for headaches.  Hematological: Does not bruise/bleed easily.  Psychiatric/Behavioral: Negative for confusion.     Physical Exam Updated Vital Signs BP (!) 172/100   Pulse 75   Temp 98.5 F (36.9 C) (Oral)   Resp (!) 26   LMP 12/19/2017   SpO2 100%   Physical Exam  Constitutional: She appears well-developed and well-nourished.  HENT:  Mouth/Throat: Oropharynx is clear and moist.  Eyes: Conjunctivae are normal. No scleral icterus.  Neck: Neck supple. No tracheal deviation present.  Cardiovascular: Normal rate, regular rhythm, normal heart sounds and intact distal pulses. Exam reveals no gallop and no friction rub.  No murmur heard. Pulmonary/Chest: Effort normal and breath sounds normal. No respiratory distress. She exhibits no tenderness.  Abdominal: Soft. Normal appearance and bowel sounds are normal. She exhibits no distension and no mass. There is no tenderness. There is no guarding.  Genitourinary:  Genitourinary Comments: No cva tenderness.   Musculoskeletal: She exhibits no edema or tenderness.  Neurological: She is alert.  Skin: Skin is warm and dry. No rash noted.  Psychiatric: She has a normal mood and affect.  Nursing note and vitals reviewed.    ED Treatments / Results  Labs (all labs ordered are listed, but only abnormal results are displayed) Results for orders placed or performed during the hospital encounter of 92/11/94  Basic metabolic panel  Result Value Ref Range   Sodium 137 135 - 145 mmol/L   Potassium 3.0 (L) 3.5 - 5.1 mmol/L   Chloride 108 98 - 111 mmol/L   CO2 23 22 - 32 mmol/L   Glucose, Bld 109 (H) 70 - 99 mg/dL   BUN 8 6 - 20 mg/dL   Creatinine, Ser 0.81 0.44 - 1.00 mg/dL   Calcium 8.4 (L) 8.9 - 10.3 mg/dL   GFR calc  non Af Amer >60 >60 mL/min   GFR calc Af Amer >60 >60 mL/min   Anion gap 6 5 - 15  CBC  Result Value Ref Range   WBC 4.4 4.0 - 10.5 K/uL   RBC 3.11 (L) 3.87 - 5.11 MIL/uL   Hemoglobin 8.0 (L) 12.0 - 15.0 g/dL   HCT 28.1 (L) 36.0 - 46.0 %   MCV 90.4 80.0 - 100.0 fL   MCH 25.7 (L) 26.0 - 34.0 pg   MCHC 28.5 (L) 30.0 - 36.0 g/dL   RDW 15.9 (H) 11.5 - 15.5 %   Platelets 207 150 - 400 K/uL   nRBC 0.0 0.0 - 0.2 %  D-dimer, quantitative (not at [facility])  Result Value Ref Range   D-Dimer, Quant 2.78 (H) 0.00 - 0.50 ug/mL-FEU  I-stat troponin, ED  Result Value Ref Range  Troponin i, poc 0.00 0.00 - 0.08 ng/mL   Comment 3          I-Stat beta hCG blood, ED  Result Value Ref Range   I-stat hCG, quantitative <5.0 <5 mIU/mL   Comment 3           Ct Angio Chest Pe W And/or Wo Contrast  Result Date: 12/28/2017 CLINICAL DATA:  42 y/o  F; 1 week of upper to mid chest pain. EXAM: CT ANGIOGRAPHY CHEST WITH CONTRAST TECHNIQUE: Multidetector CT imaging of the chest was performed using the standard protocol during bolus administration of intravenous contrast. Multiplanar CT image reconstructions and MIPs were obtained to evaluate the vascular anatomy. CONTRAST:  123mL ISOVUE-370 IOPAMIDOL (ISOVUE-370) INJECTION 76% COMPARISON:  03/30/2016 CT angiogram of the chest. FINDINGS: Cardiovascular: Satisfactory opacification of the pulmonary arteries to the segmental level. No evidence of pulmonary embolism. Stable moderate cardiomegaly. No pericardial effusion. Mediastinum/Nodes: Stable mild residual thymic tissue in the anterior mediastinum. Stable axillary lymphadenopathy. No mediastinal lymphadenopathy. Patent central airway. Normal thoracic esophagus. Multiple stable subcentimeter nodules in the right lobe of the thyroid gland. Lungs/Pleura: Coarse reticular and ground-glass opacities in the lung bases with peripheral sparing is stable from prior CT compatible with fibrosis. No honeycombing. Small focus of  pleural calcification at the left lateral lung base. Upper Abdomen: No acute abnormality. Musculoskeletal: No chest wall abnormality. No acute or significant osseous findings. Review of the MIP images confirms the above findings. IMPRESSION: 1. No pulmonary embolus. 2. Small bilateral pleural effusions. 3. Stable moderate cardiomegaly. 4. Stable bibasilar pulmonary fibrosis. 5. Stable nonspecific bilateral axillary lymphadenopathy. Electronically Signed   By: Kristine Garbe M.D.   On: 12/28/2017 22:05     EKG EKG Interpretation  Date/Time:  Tuesday December 28 2017 16:23:37 EST Ventricular Rate:  82 PR Interval:  120 QRS Duration: 86 QT Interval:  366 QTC Calculation: 427 R Axis:   78 Text Interpretation:  Normal sinus rhythm Non-specific ST-t changes Confirmed by Lajean Saver [phone]) on 12/28/2017 6:06:43 PM   Radiology Ct Angio Chest Pe W And/or Wo Contrast  Result Date: 12/28/2017 CLINICAL DATA:  42 y/o  F; 1 week of upper to mid chest pain. EXAM: CT ANGIOGRAPHY CHEST WITH CONTRAST TECHNIQUE: Multidetector CT imaging of the chest was performed using the standard protocol during bolus administration of intravenous contrast. Multiplanar CT image reconstructions and MIPs were obtained to evaluate the vascular anatomy. CONTRAST:  141mL ISOVUE-370 IOPAMIDOL (ISOVUE-370) INJECTION 76% COMPARISON:  03/30/2016 CT angiogram of the chest. FINDINGS: Cardiovascular: Satisfactory opacification of the pulmonary arteries to the segmental level. No evidence of pulmonary embolism. Stable moderate cardiomegaly. No pericardial effusion. Mediastinum/Nodes: Stable mild residual thymic tissue in the anterior mediastinum. Stable axillary lymphadenopathy. No mediastinal lymphadenopathy. Patent central airway. Normal thoracic esophagus. Multiple stable subcentimeter nodules in the right lobe of the thyroid gland. Lungs/Pleura: Coarse reticular and ground-glass opacities in the lung bases with peripheral  sparing is stable from prior CT compatible with fibrosis. No honeycombing. Small focus of pleural calcification at the left lateral lung base. Upper Abdomen: No acute abnormality. Musculoskeletal: No chest wall abnormality. No acute or significant osseous findings. Review of the MIP images confirms the above findings. IMPRESSION: 1. No pulmonary embolus. 2. Small bilateral pleural effusions. 3. Stable moderate cardiomegaly. 4. Stable bibasilar pulmonary fibrosis. 5. Stable nonspecific bilateral axillary lymphadenopathy. Electronically Signed   By: Kristine Garbe M.D.   On: 12/28/2017 22:05    Procedures Procedures (including critical care time)  Medications Ordered in  ED Medications  iopamidol (ISOVUE-370) 76 % injection (has no administration in time range)  potassium chloride SA (K-DUR,KLOR-CON) CR tablet 40 mEq (40 mEq Oral Given 12/28/17 1856)  iopamidol (ISOVUE-370) 76 % injection 100 mL (100 mLs Intravenous Contrast Given 12/28/17 2146)     Initial Impression / Assessment and Plan / ED Course  I have reviewed the triage vital signs and the nursing notes.  Pertinent labs & imaging results that were available during my care of the patient were reviewed by me and considered in my medical decision making (see chart for details).  Iv ns. Labs.   Reviewed nursing notes and prior charts for additional history. Recent cxr reviewed - hx fibrosis, no acute pna.   Labs reviewed - ddimer high. Will get cta chest.  Chest ct reviewed - no PE. Hx chr fibrosis changes, small bil eff. Discussed w pt. She indicates in past had seem pulmonary for same.   No increased wob. Pt afebrile. No active/current chest pain. abd soft nt.   Pt currently appears stable for d/c.   rec close pcp/rheum f/u.   Return precautions provided.     Final Clinical Impressions(s) / ED Diagnoses   Final diagnoses:  None    ED Discharge Orders    None       Lajean Saver, MD 12/28/17 2241

## 2018-05-18 ENCOUNTER — Encounter (HOSPITAL_COMMUNITY): Payer: Self-pay

## 2018-05-18 ENCOUNTER — Ambulatory Visit (HOSPITAL_COMMUNITY)
Admission: EM | Admit: 2018-05-18 | Discharge: 2018-05-18 | Disposition: A | Discharge status: Home / Self Care | Payer: Medicaid Other | Attending: Family Medicine | Admitting: Family Medicine

## 2018-05-18 ENCOUNTER — Other Ambulatory Visit: Payer: Self-pay

## 2018-05-18 ENCOUNTER — Emergency Department (HOSPITAL_COMMUNITY)
Admission: EM | Admit: 2018-05-18 | Discharge: 2018-05-19 | Disposition: A | Discharge status: Home / Self Care | Payer: Medicaid Other | Attending: Emergency Medicine | Admitting: Emergency Medicine

## 2018-05-18 DIAGNOSIS — M79661 Pain in right lower leg: Secondary | ICD-10-CM | POA: Insufficient documentation

## 2018-05-18 DIAGNOSIS — Z79899 Other long term (current) drug therapy: Secondary | ICD-10-CM | POA: Insufficient documentation

## 2018-05-18 DIAGNOSIS — D649 Anemia, unspecified: Secondary | ICD-10-CM

## 2018-05-18 DIAGNOSIS — E876 Hypokalemia: Secondary | ICD-10-CM

## 2018-05-18 DIAGNOSIS — M79604 Pain in right leg: Secondary | ICD-10-CM

## 2018-05-18 DIAGNOSIS — R791 Abnormal coagulation profile: Secondary | ICD-10-CM | POA: Insufficient documentation

## 2018-05-18 DIAGNOSIS — R7989 Other specified abnormal findings of blood chemistry: Secondary | ICD-10-CM

## 2018-05-18 LAB — CBC
HCT: 28.4 % — ABNORMAL LOW (ref 36.0–46.0)
Hemoglobin: 8.5 g/dL — ABNORMAL LOW (ref 12.0–15.0)
MCH: 25.2 pg — ABNORMAL LOW (ref 26.0–34.0)
MCHC: 29.9 g/dL — ABNORMAL LOW (ref 30.0–36.0)
MCV: 84.3 fL (ref 80.0–100.0)
Platelets: 192 10*3/uL (ref 150–400)
RBC: 3.37 MIL/uL — ABNORMAL LOW (ref 3.87–5.11)
RDW: 15.9 % — ABNORMAL HIGH (ref 11.5–15.5)
WBC: 4.9 10*3/uL (ref 4.0–10.5)
nRBC: 0 % (ref 0.0–0.2)

## 2018-05-18 LAB — COMPREHENSIVE METABOLIC PANEL
ALT: 10 U/L (ref 0–44)
AST: 18 U/L (ref 15–41)
Albumin: 3.2 g/dL — ABNORMAL LOW (ref 3.5–5.0)
Alkaline Phosphatase: 52 U/L (ref 38–126)
Anion gap: 8 (ref 5–15)
BUN: 8 mg/dL (ref 6–20)
CO2: 25 mmol/L (ref 22–32)
Calcium: 8.6 mg/dL — ABNORMAL LOW (ref 8.9–10.3)
Chloride: 103 mmol/L (ref 98–111)
Creatinine, Ser: 0.94 mg/dL (ref 0.44–1.00)
GFR calc Af Amer: >60 mL/min (ref >60)
GFR calc non Af Amer: >60 mL/min (ref >60)
Glucose, Bld: 80 mg/dL (ref 70–99)
Potassium: 3 mmol/L — ABNORMAL LOW (ref 3.5–5.1)
Sodium: 136 mmol/L (ref 135–145)
Total Bilirubin: 0.4 mg/dL (ref 0.3–1.2)
Total Protein: 8.4 g/dL — ABNORMAL HIGH (ref 6.5–8.1)

## 2018-05-18 LAB — D-DIMER, QUANTITATIVE: D-Dimer, Quant: 8.44 ug/mL-FEU — ABNORMAL HIGH (ref 0.00–0.50)

## 2018-05-18 MED ORDER — POTASSIUM CHLORIDE CRYS ER 20 MEQ PO TBCR
40.0000 meq | EXTENDED_RELEASE_TABLET | Freq: Once | ORAL | Status: AC
  Administered 2018-05-18: 40 meq via ORAL
  Filled 2018-05-18: qty 2

## 2018-05-18 NOTE — ED Provider Notes (Signed)
[facility] EMERGENCY DEPARTMENT Provider Note   CSN: [id number] Arrival date & time: 05/18/18  2012    History   Chief Complaint Chief Complaint  Patient presents with  . Leg Pain    HPI Christine Todd is a 43 y.o. female.  The history is provided by the patient.  Leg Pain  She has history of lupus and comes in with right calf pain for about the last 10 days.  It started in her lower calf and now is spreading up to her mid calf.  She does not recall any trauma.  Pain is rated at 6/10.  It is worse with walking.  Nothing makes it better.  She went to urgent care and was referred here for further evaluation.  She denies chest pain but has been having right upper quadrant pain and is scheduled to have an ultrasound at [facility].  That is supposed to be done in 6 days.  She denies dyspnea, nausea, vomiting.  She denies fever or chills.  She has an IUD, but denies oral contraceptive use.  She denies history of previous DVT.  She denies recent travel or surgery.  She does not have a lupus anticoagulant.  Past Medical History:  Diagnosis Date  . Anemia   . Arthritis    "hands and feet" (07/04/2014)  . GERD (gastroesophageal reflux disease)   . Headache    "weekly" (07/04/2014)  . History of blood transfusion 07/04/2014   "related to anemia"  . Lupus ([place])   . Pneumonia "several times"  . 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 SLE (systemic lupus erythematosus) ([place])     Patient Active Problem List   Diagnosis Date Noted  . Symptomatic anemia 07/04/2014  . Benign essential HTN 07/04/2014  . Hypokalemia 07/04/2014  . FOOT PAIN, LEFT 12/10/2009  . KNEE PAIN 11/13/2008  . SKIN RASH 11/13/2008  . UTI 11/09/2008  . Iron deficiency anemia 11/01/2008  . MICROSCOPIC HEMATURIA 11/01/2008  . INSOMNIA 07/31/2008  . HEADACHE 07/31/2008  . ABSCESS, SKIN 04/27/2008  . FREQUENCY, URINARY 03/01/2008  . OVARIAN CYST, RUPTURED 11/08/2007  . GERD 09/24/2007  .  SLE 11/25/2006  . CANDIDIASIS 11/18/2006  . SORE THROAT 11/18/2006  . ABNORMAL RESULT, FUNCTION STUDY, LIVER 11/18/2006    Past Surgical History:  Procedure Laterality Date  . DILATION AND CURETTAGE OF UTERUS  2009  . RENAL BIOPSY, PERCUTANEOUS  ?2013     OB History   No obstetric history on file.      Home Medications    Prior to Admission medications   Medication Sig Start Date End Date Taking? Authorizing Provider  dicyclomine (BENTYL) 20 MG tablet Take 1 tablet (20 mg total) by mouth every 12 (twelve) hours as needed (for abdominal pain). 12/26/17   Antonietta Breach, PA-C  hydroxychloroquine (PLAQUENIL) 200 MG tablet Take 200 mg by mouth daily.  09/28/17   [provider]  ibuprofen (ADVIL,MOTRIN) 600 MG tablet Take 1 tablet (600 mg total) by mouth every 6 (six) hours as needed for moderate pain. Patient not taking: Reported on 12/28/2017 01/16/16   Melony Overly, MD  lisinopril (PRINIVIL,ZESTRIL) 5 MG tablet Take 5 mg by mouth daily.  10/26/17   [provider]  meclizine (ANTIVERT) 25 MG tablet Take 1 tablet (25 mg total) by mouth 3 (three) times daily as needed for dizziness. 11/05/17   Domenic Moras, PA-C    Family History Family History  Problem Relation  Age of Onset  . Diabetes Other   . Cancer Other   . Hypertension Other   . Diabetes Father     Social History Social History   Tobacco Use  . Smoking status: Never Smoker  . Smokeless tobacco: Never Used  Substance Use Topics  . Alcohol use: Yes    Comment: occasionally  . Drug use: No     Allergies   Sulfa antibiotics and Sulfonamide derivatives   Review of Systems Review of Systems  All other systems reviewed and are negative.    Physical Exam Updated Vital Signs BP (!) 143/91 (BP Location: Right Arm)   Pulse 95   Temp 98.9 F (37.2 C) (Oral)   Resp 18   Ht 5' 7.5" (1.715 m)   LMP 05/06/2018   SpO2 100%   BMI 26.23 kg/m   Physical Exam Vitals signs and nursing note  reviewed.    43 year old female, resting comfortably and in no acute distress. Vital signs are significant for mildly elevated blood pressure. Oxygen saturation is 100%, which is normal. Head is normocephalic and atraumatic. PERRLA, EOMI. Oropharynx is clear. Neck is nontender and supple without adenopathy or JVD. Back is nontender and there is no CVA tenderness. Lungs are clear without rales, wheezes, or rhonchi. Chest is nontender. Heart has regular rate and rhythm without murmur. Abdomen is soft, flat, with mild to moderate right upper quadrant tenderness.  There are no masses or hepatosplenomegaly and peristalsis is normoactive. Extremities: Mild to moderate tenderness of the distal right calf.  Right calf circumference is 1 cm greater than left calf circumference.  There is a positive Homans sign on the right.  Distal pulses are strong and capillary refill is prompt and sensation is normal.  There is no edema. Skin is warm and dry without rash. Neurologic: Mental status is normal, cranial nerves are intact, there are no motor or sensory deficits.  ED Treatments / Results  Labs (all labs ordered are listed, but only abnormal results are displayed) Labs Reviewed  CBC - Abnormal; Notable for the following components:      Result Value   RBC 3.37 (*)    Hemoglobin 8.5 (*)    HCT 28.4 (*)    MCH 25.2 (*)    MCHC 29.9 (*)    RDW 15.9 (*)    All other components within normal limits  COMPREHENSIVE METABOLIC PANEL - Abnormal; Notable for the following components:   Potassium 3.0 (*)    Calcium 8.6 (*)    Total Protein 8.4 (*)    Albumin 3.2 (*)    All other components within normal limits  D-DIMER, QUANTITATIVE (NOT AT [facility]) - Abnormal; Notable for the following components:   D-Dimer, Quant 8.44 (*)    All other components within normal limits  I-STAT BETA HCG BLOOD, ED ([facility], [facility], [facility] ONLY)   Radiology Ct Angio Chest Pe W And/or Wo Contrast  Result Date: 05/19/2018 CLINICAL DATA:   Right calf pain and positive D-dimer EXAM: CT ANGIOGRAPHY CHEST WITH CONTRAST TECHNIQUE: Multidetector CT imaging of the chest was performed using the standard protocol during bolus administration of intravenous contrast. Multiplanar CT image reconstructions and MIPs were obtained to evaluate the vascular anatomy. CONTRAST:  34mL OMNIPAQUE 350 COMPARISON:  12/28/2017 FINDINGS: Cardiovascular: Thoracic aorta demonstrates a normal branching pattern. No significant atherosclerotic calcifications are noted. No dissection is seen. Heart is mildly enlarged in size. No coronary calcifications are noted. The pulmonary artery is well visualized and shows  no definitive filling defect to suggest pulmonary embolism. Mediastinum/Nodes: No hilar or mediastinal adenopathy is noted. The thoracic inlet is within normal limits. The esophagus as visualized is within normal limits. Lungs/Pleura: Lungs are well aerated bilaterally. There are bibasilar pulmonary fibrotic changes identified similar to that seen on prior CT examination. No sizable effusion is noted. No focal parenchymal nodule is seen. Upper Abdomen: Visualized upper abdomen is unremarkable. Musculoskeletal: No chest wall abnormality. No acute or significant osseous findings. Review of the MIP images confirms the above findings. IMPRESSION: Chronic fibrotic changes in the lung bases bilaterally. No evidence of pulmonary emboli. No acute abnormality noted. Electronically Signed   By: Inez Catalina M.D.   On: 05/19/2018 00:46    Procedures Procedures   Medications Ordered in ED Medications  Rivaroxaban (XARELTO) tablet 15 mg (has no administration in time range)  potassium chloride SA (K-DUR,KLOR-CON) CR tablet 40 mEq (40 mEq Oral Given 05/18/18 2350)  iohexol (OMNIPAQUE) 350 MG/ML injection 75 mL (75 mLs Intravenous Contrast Given 05/19/18 0021)     Initial Impression / Assessment and Plan / ED Course  I have reviewed the triage vital signs and the nursing  notes.  Pertinent labs & imaging results that were available during my care of the patient were reviewed by me and considered in my medical decision making (see chart for details).  Right calf pain with slightly increased calf circumference concerning for DVT.  D-dimer is significantly elevated.  Given right-sided abdominal pain which could conceivably be referred pain from the chest, will send for CT angiogram of the chest.  Other labs are significant for normocytic anemia and hypokalemia, both of which had been present previously.  She is given a dose of oral potassium.  Old records are reviewed, and she had a moderately elevated d-dimer last November and had a negative CT angiogram of the chest, and also had a negative CT angiogram of the chest in December 2018.  CT angiogram shows no evidence of pulmonary embolism. She is sent home with a prescription for K-Dur. She is given a dose of rivaroxaban and will be brought back for venous duplex scan in the morning.  Final Clinical Impressions(s) / ED Diagnoses   Final diagnoses:  Right leg pain  Elevated d-dimer  Normocytic anemia  Hypokalemia    ED Discharge Orders         Ordered    potassium chloride SA (K-DUR,KLOR-CON) 20 MEQ tablet  2 times daily     05/19/18 0059    VAS [place] LOWER EXTREMITY VENOUS (DVT)     05/19/18 6270           Delora Fuel, MD 35/00/93 0105

## 2018-05-18 NOTE — ED Triage Notes (Addendum)
Patient reports RIGHT calf pain x 1 week. States she went to UC early and was told they couldn't rule out a blood clot. Reports being sent over here for low grade fever and elevated heart rate as well. Afebrile at this time and did not take Tylenol.  Denies cough, SOB, chest pain, nausea, vomiting, or dysuria.

## 2018-05-18 NOTE — ED Notes (Addendum)
Patient has pain in right lower leg, pain for 1 1/2 weeks.  No known injury.  Patient has lupus.  Patient complains that foot feels intermittently tingling.  Spoke to dr hagler prior to checking in department.  Patient agreeable to being seen at this department.  Patient understood no testing for dvt at this location

## 2018-05-19 ENCOUNTER — Other Ambulatory Visit: Payer: Self-pay

## 2018-05-19 ENCOUNTER — Emergency Department (HOSPITAL_COMMUNITY): Payer: Medicaid Other

## 2018-05-19 ENCOUNTER — Ambulatory Visit (HOSPITAL_BASED_OUTPATIENT_CLINIC_OR_DEPARTMENT_OTHER)
Admission: RE | Admit: 2018-05-19 | Discharge: 2018-05-19 | Disposition: A | Discharge status: Home / Self Care | Payer: Medicaid Other | Source: Ambulatory Visit | Attending: Emergency Medicine | Admitting: Emergency Medicine

## 2018-05-19 DIAGNOSIS — M79609 Pain in unspecified limb: Secondary | ICD-10-CM

## 2018-05-19 DIAGNOSIS — M7989 Other specified soft tissue disorders: Secondary | ICD-10-CM

## 2018-05-19 LAB — I-STAT BETA HCG BLOOD, ED (MC, WL, AP ONLY): I-stat hCG, quantitative: <5 m[IU]/mL (ref <5)

## 2018-05-19 MED ORDER — RIVAROXABAN 15 MG PO TABS
15.0000 mg | ORAL_TABLET | Freq: Once | ORAL | Status: AC
  Administered 2018-05-19: 01:00:00 15 mg via ORAL
  Filled 2018-05-19: qty 1

## 2018-05-19 MED ORDER — POTASSIUM CHLORIDE CRYS ER 20 MEQ PO TBCR: 20.0000 meq | EXTENDED_RELEASE_TABLET | Freq: Two times a day (BID) | ORAL | 0 refills | Status: DC

## 2018-05-19 MED ORDER — IOHEXOL 350 MG/ML SOLN
75.0000 mL | Freq: Once | INTRAVENOUS | Status: AC | PRN
  Administered 2018-05-19: 75 mL via INTRAVENOUS

## 2018-05-19 NOTE — Discharge Instructions (Addendum)
Return in the morning to get a vascular ultrasound to make sure there is not a blood clot in your leg.  If there is a blood clot, you will need to be on blood thinners for the next 6 weeks.  If there is no clot, you will need to treated as a pulled muscle with ice, heat, ibuprofen or naproxen.

## 2018-05-19 NOTE — ED Notes (Signed)
Patient verbalizes understanding of discharge instructions. Opportunity for questioning and answers were provided. Armband removed by staff, pt discharged from ED.  

## 2018-05-19 NOTE — Progress Notes (Signed)
Right lower extremity venous duplex has been completed. Preliminary results can be found in CV Proc through chart review.   05/19/18 9:14 AM Christine Todd RVT

## 2018-05-28 NOTE — ED Provider Notes (Signed)
[place]   [id number] 05/18/18 Arrival Time: 1935  ASSESSMENT & PLAN:  1. Pain in right lower leg    Discussed my inability to ensure she does not have a DVT. To ED for evaluation. Stable upon discharge.  Follow-up Information    Go to  [facility].   Specialty:  Emergency Medicine Contact information: [address] [phone] [place] [phone]         Reviewed expectations re: course of current medical issues. Questions answered. Outlined signs and symptoms indicating need for more acute intervention. Patient verbalized understanding. After Visit Summary given.  SUBJECTIVE: History from: patient. Christine Todd is a 43 y.o. female who reports fairly persistent R calf pain for the past week; gradual onset; describes as "an ache"; questions worsening over the past several days. No trauma. Ambulatory but with discomfort. Better with rest. Questions some swelling now. No CP/SOB. Afebrile. No OCP use. Has IUD. No recent prolonged travel.  Social History   Tobacco Use  Smoking Status Never Smoker  Smokeless Tobacco Never Used   No new medications. No OTC tx.  Past Surgical History:  Procedure Laterality Date  . DILATION AND CURETTAGE OF UTERUS  2009  . RENAL BIOPSY, PERCUTANEOUS  ?2013    ROS: As per HPI. All other systems negative.   OBJECTIVE:  Vitals:   05/18/18 1958  BP: (!) 160/93  Pulse: 100  Resp: 20  Temp: (!) 100.6 F (38.1 C)  SpO2: 98%    Low grade temp noted along with elevated BP.  General appearance: alert; no distress HEENT: [place]; AT CV: RRR without murmer Lungs: unlabored respirations; CTAB Extremities: . RLE: warm and well perfused; poorly localized mild to moderate tenderness over right calf with slight swelling compared to left calf; without gross deformities;  with no bruising; ROM: normal without reported discomfort CV: brisk extremity capillary  refill of RLE; 2+ DP and PT pulse of RLE. Skin: warm and dry; no visible rashes Neurologic: gait normal; normal reflexes of RLE and LLE; normal sensation of RLE and LLE; normal strength of RLE and LLE Psychological: alert and cooperative; normal mood and affect  Allergies  Allergen Reactions  . Sulfa Antibiotics Itching  . Sulfonamide Derivatives     REACTION: rash, itching    Past Medical History:  Diagnosis Date  . Anemia   . Arthritis    "hands and feet" (07/04/2014)  . GERD (gastroesophageal reflux disease)   . Headache    "weekly" (07/04/2014)  . History of blood transfusion 07/04/2014   "related to anemia"  . Lupus ([place])   . Pneumonia "several times"  . 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 SLE (systemic lupus erythematosus) ([facility])    Social History   Socioeconomic History  . Marital status: Single    Spouse name: Not on file  . Number of children: Not on file  . Years of education: Not on file  . Highest education level: Not on file  Occupational History  . Not on file  Social Needs  . Financial resource strain: Not on file  . Food insecurity:    Worry: Not on file    Inability: Not on file  . Transportation needs:    Medical: Not on file    Non-medical: Not on file  Tobacco Use  . Smoking status: Never Smoker  . Smokeless tobacco: Never Used  Substance and Sexual Activity  . Alcohol use:  Yes    Comment: occasionally  . Drug use: No  . Sexual activity: Yes    Birth control/protection: None  Lifestyle  . Physical activity:    Days per week: Not on file    Minutes per session: Not on file  . Stress: Not on file  Relationships  . Social connections:    Talks on phone: Not on file    Gets together: Not on file    Attends religious service: Not on file    Active member of club or organization: Not on file    Attends meetings of clubs or organizations: Not on file    Relationship status: Not on file  Other Topics Concern  . Not on file  Social  History Narrative  . Not on file   Family History  Problem Relation Age of Onset  . Diabetes Other   . Cancer Other   . Hypertension Other   . Diabetes Father    Past Surgical History:  Procedure Laterality Date  . DILATION AND CURETTAGE OF UTERUS  2009  . RENAL BIOPSY, PERCUTANEOUS  ?2013      Vanessa Kick, MD 05/28/18 [phone]

## 2018-06-17 ENCOUNTER — Encounter (HOSPITAL_COMMUNITY): Payer: Self-pay

## 2018-06-17 ENCOUNTER — Ambulatory Visit (HOSPITAL_COMMUNITY)
Admission: EM | Admit: 2018-06-17 | Discharge: 2018-06-17 | Disposition: A | Discharge status: Home / Self Care | Payer: Medicaid Other | Attending: Family Medicine | Admitting: Family Medicine

## 2018-06-17 DIAGNOSIS — M25531 Pain in right wrist: Secondary | ICD-10-CM

## 2018-06-17 DIAGNOSIS — M25562 Pain in left knee: Secondary | ICD-10-CM

## 2018-06-17 MED ORDER — PREDNISONE 20 MG PO TABS: 40.0000 mg | ORAL_TABLET | Freq: Every day | ORAL | 0 refills | Status: AC

## 2018-06-17 MED ORDER — PREDNISONE 5 MG PO TABS: 5.0000 mg | ORAL_TABLET | Freq: Every day | ORAL | 0 refills | Status: DC

## 2018-06-17 MED ORDER — PREDNISONE 20 MG PO TABS: 40.0000 mg | ORAL_TABLET | Freq: Every day | ORAL | 0 refills | Status: DC

## 2018-06-17 NOTE — ED Provider Notes (Signed)
[facility]    CSN: [id number] Arrival date & time: 06/17/18  1943     History   Chief Complaint Chief Complaint  Patient presents with  . Knee Pain    HPI Christine Todd is a 43 y.o. female.   Christine Todd presents with complaints of right wrist pain as well as left knee pain. Started last night but had left wrist pain and right knee pain. Today pain to alternate joints increased. No injury. No swelling or redness. Has had right knee in the past. Placed a brace to left knee which did help some. 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 Tingling sensation to left ring finger which does radiate up the forearm. Hx of lupus, takes plaquenil and cellcept. She took her last prednisone last night, which she typically takes daily. Has a video visit with her rheumatologist next week. Denies any previous similar but does have history of hand arthritis. Pain with activity and weight bearing. No ankle pain, or elbow pain. She is left handed. Hx of lupus nephritis.    ROS per HPI, negative if not otherwise mentioned.      Past Medical History:  Diagnosis Date  . Anemia   . Arthritis    "hands and feet" (07/04/2014)  . GERD (gastroesophageal reflux disease)   . Headache    "weekly" (07/04/2014)  . History of blood transfusion 07/04/2014   "related to anemia"  . Lupus ([place])   . Pneumonia "several times"  . 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 SLE (systemic lupus erythematosus) ([place])     Patient Active Problem List   Diagnosis Date Noted  . Symptomatic anemia 07/04/2014  . Benign essential HTN 07/04/2014  . Hypokalemia 07/04/2014  . FOOT PAIN, LEFT 12/10/2009  . KNEE PAIN 11/13/2008  . SKIN RASH 11/13/2008  . UTI 11/09/2008  . Iron deficiency anemia 11/01/2008  . MICROSCOPIC HEMATURIA 11/01/2008  . INSOMNIA 07/31/2008  . HEADACHE 07/31/2008  . ABSCESS, SKIN 04/27/2008  . FREQUENCY, URINARY 03/01/2008  . OVARIAN CYST, RUPTURED 11/08/2007  . GERD 09/24/2007   . SLE 11/25/2006  . CANDIDIASIS 11/18/2006  . SORE THROAT 11/18/2006  . ABNORMAL RESULT, FUNCTION STUDY, LIVER 11/18/2006    Past Surgical History:  Procedure Laterality Date  . DILATION AND CURETTAGE OF UTERUS  2009  . RENAL BIOPSY, PERCUTANEOUS  ?2013    OB History   No obstetric history on file.      Home Medications    Prior to Admission medications   Medication Sig Start Date End Date Taking? Authorizing Provider  famotidine (PEPCID) 40 MG tablet Take 40 mg by mouth at bedtime. 05/02/18   [provider]  hydroxychloroquine (PLAQUENIL) 200 MG tablet Take 200 mg by mouth every evening.  09/28/17   [provider]  lisinopril (PRINIVIL,ZESTRIL) 5 MG tablet Take 5 mg by mouth every evening.  10/26/17   [provider]  mycophenolate (CELLCEPT) 500 MG tablet Take 500-1,000 mg by mouth See admin instructions. Take 1 capsule every morning and take 2 capsules every evening 04/15/18   [provider]  potassium chloride SA (K-DUR,KLOR-CON) 20 MEQ tablet Take 1 tablet (20 mEq total) by mouth 2 (two) times daily. 9/67/59   Delora Fuel, MD  predniSONE (DELTASONE) 20 MG tablet Take 2 tablets (40 mg total) by mouth daily with breakfast for 3 days. 06/17/18 06/20/18  Zigmund Gottron, NP  predniSONE (DELTASONE) 5 MG tablet Take 5 mg by  mouth every evening.  04/13/18   [provider]  predniSONE (DELTASONE) 5 MG tablet Take 1 tablet (5 mg total) by mouth daily with breakfast. 06/17/18   Zigmund Gottron, NP    Family History Family History  Problem Relation Age of Onset  . Diabetes Other   . Cancer Other   . Hypertension Other   . Diabetes Father     Social History Social History   Tobacco Use  . Smoking status: Never Smoker  . Smokeless tobacco: Never Used  Substance Use Topics  . Alcohol use: Yes    Comment: occasionally  . Drug use: No     Allergies   Sulfa antibiotics and Sulfonamide derivatives   Review of Systems Review  of Systems   Physical Exam Triage Vital Signs ED Triage Vitals [06/17/18 1952]  Enc Vitals Group     BP (!) 193/108     Pulse Rate 75     Resp 20     Temp 98.5 F (36.9 C)     Temp Source Oral     SpO2 98 %     Weight      Height      Head Circumference      Peak Flow      Pain Score 10     Pain Loc      Pain Edu?      Excl. in [place]?    No data found.  Updated Vital Signs BP (!) 193/108 (BP Location: Right Arm)   Pulse 75   Temp 98.5 F (36.9 C) (Oral)   Resp 20   LMP 06/03/2018   SpO2 98%   Physical Exam Constitutional:      General: She is not in acute distress.    Appearance: She is well-developed.  Cardiovascular:     Rate and Rhythm: Normal rate and regular rhythm.     Heart sounds: Normal heart sounds.  Pulmonary:     Effort: Pulmonary effort is normal.     Breath sounds: Normal breath sounds.  Musculoskeletal:     Right wrist: She exhibits decreased range of motion, tenderness and bony tenderness. She exhibits no swelling, no effusion, no crepitus, no deformity and no laceration.     Right hand: She exhibits tenderness. She exhibits no swelling. Normal sensation noted. Normal strength noted.     Comments: Joints of right hand with baseline deformity noted; right hand ring finger with radiating pain without specific point tenderness; no redness or swelling; negative tinnels; cap refill < 2 seconds; strong radial pulse; pain with wrist extension to wrist; no swelling, redness or warmth; strong radial pulse; left knee without point tenderness; no redness or swelling; pain with extension to posterior knee; calf is soft  Skin:    General: Skin is warm and dry.  Neurological:     Mental Status: She is alert and oriented to person, place, and time.      UC Treatments / Results  Labs (all labs ordered are listed, but only abnormal results are displayed) Labs Reviewed - No data to display  EKG None  Radiology No results found.  Procedures Procedures  (including critical care time)  Medications Ordered in UC Medications - No data to display  Initial Impression / Assessment and Plan / UC Course  I have reviewed the triage vital signs and the nursing notes.  Pertinent labs & imaging results that were available during my care of the patient were reviewed by me and considered in  my medical decision making (see chart for details).     New onset joint pain, alternating and fluctuating. No injury. No indication of septic joint. Suspect arthritis, possibly related to lupus. Ace, brace, ice, elevation for pain management. Will provide 3 days of increased prednisone and then filled for the next 5 days until next rheumatology appointment. Patient verbalized understanding and agreeable to plan.    Final Clinical Impressions(s) / UC Diagnoses   Final diagnoses:  Right wrist pain  Acute pain of left knee     Discharge Instructions     Three days of 40mg  of prednisone.  Then may go back down to your 5mg  dose daily.  Intermittent brace to right wrist, ace wrap as needed for compression and support.  Ice, elevation.  Activity as tolerated.  Please follow up with your rheumatologist for further evaluation and treatment.    ED Prescriptions    Medication Sig Dispense Auth. Provider   predniSONE (DELTASONE) 20 MG tablet Take 2 tablets (40 mg total) by mouth daily with breakfast for 3 days. 6 tablet Augusto Gamble B, NP   predniSONE (DELTASONE) 5 MG tablet Take 1 tablet (5 mg total) by mouth daily with breakfast. 5 tablet Zigmund Gottron, NP     Controlled Substance Prescriptions [place] Controlled Substance Registry consulted? Not Applicable   Zigmund Gottron, NP 06/17/18 2028

## 2018-06-17 NOTE — ED Triage Notes (Signed)
Pt c/o rt wrist and lt knee pain, denies injury, hx of lupus

## 2018-06-17 NOTE — Discharge Instructions (Signed)
Three days of 40mg  of prednisone.  Then may go back down to your 5mg  dose daily.  Intermittent brace to right wrist, ace wrap as needed for compression and support.  Ice, elevation.  Activity as tolerated.  Please follow up with your rheumatologist for further evaluation and treatment.

## 2018-09-14 ENCOUNTER — Emergency Department (HOSPITAL_COMMUNITY)
Admission: EM | Admit: 2018-09-14 | Discharge: 2018-09-14 | Disposition: A | Discharge status: Home / Self Care | Payer: BC Managed Care – PPO | Attending: Emergency Medicine | Admitting: Emergency Medicine

## 2018-09-14 DIAGNOSIS — I10 Essential (primary) hypertension: Secondary | ICD-10-CM | POA: Insufficient documentation

## 2018-09-14 DIAGNOSIS — D696 Thrombocytopenia, unspecified: Secondary | ICD-10-CM | POA: Insufficient documentation

## 2018-09-14 DIAGNOSIS — D649 Anemia, unspecified: Secondary | ICD-10-CM | POA: Insufficient documentation

## 2018-09-14 DIAGNOSIS — Z79899 Other long term (current) drug therapy: Secondary | ICD-10-CM | POA: Diagnosis not present

## 2018-09-14 DIAGNOSIS — R799 Abnormal finding of blood chemistry, unspecified: Secondary | ICD-10-CM | POA: Diagnosis present

## 2018-09-14 LAB — CBC WITH DIFFERENTIAL/PLATELET
Abs Immature Granulocytes: 0.01 10*3/uL (ref 0.00–0.07)
Basophils Absolute: 0 10*3/uL (ref 0.0–0.1)
Basophils Relative: 0 %
Eosinophils Absolute: 0 10*3/uL (ref 0.0–0.5)
Eosinophils Relative: 1 %
HCT: 28.6 % — ABNORMAL LOW (ref 36.0–46.0)
Hemoglobin: 8.5 g/dL — ABNORMAL LOW (ref 12.0–15.0)
Immature Granulocytes: 0 %
Lymphocytes Relative: 30 %
Lymphs Abs: 1.3 10*3/uL (ref 0.7–4.0)
MCH: 26.2 pg (ref 26.0–34.0)
MCHC: 29.7 g/dL — ABNORMAL LOW (ref 30.0–36.0)
MCV: 88 fL (ref 80.0–100.0)
Monocytes Absolute: 0.3 10*3/uL (ref 0.1–1.0)
Monocytes Relative: 8 %
Neutro Abs: 2.5 10*3/uL (ref 1.7–7.7)
Neutrophils Relative %: 61 %
Platelets: 124 10*3/uL — ABNORMAL LOW (ref 150–400)
RBC: 3.25 MIL/uL — ABNORMAL LOW (ref 3.87–5.11)
RDW: 16 % — ABNORMAL HIGH (ref 11.5–15.5)
WBC: 4.2 10*3/uL (ref 4.0–10.5)
nRBC: 0 % (ref 0.0–0.2)

## 2018-09-14 LAB — I-STAT BETA HCG BLOOD, ED (MC, WL, AP ONLY): I-stat hCG, quantitative: <5 m[IU]/mL (ref <5)

## 2018-09-14 LAB — CBC
HCT: 28.8 % — ABNORMAL LOW (ref 36.0–46.0)
Hemoglobin: 8.4 g/dL — ABNORMAL LOW (ref 12.0–15.0)
MCH: 25.9 pg — ABNORMAL LOW (ref 26.0–34.0)
MCHC: 29.2 g/dL — ABNORMAL LOW (ref 30.0–36.0)
MCV: 88.9 fL (ref 80.0–100.0)
Platelets: 129 10*3/uL — ABNORMAL LOW (ref 150–400)
RBC: 3.24 MIL/uL — ABNORMAL LOW (ref 3.87–5.11)
RDW: 16.1 % — ABNORMAL HIGH (ref 11.5–15.5)
WBC: 4.2 10*3/uL (ref 4.0–10.5)
nRBC: 0 % (ref 0.0–0.2)

## 2018-09-14 LAB — BASIC METABOLIC PANEL
Anion gap: 7 (ref 5–15)
BUN: 8 mg/dL (ref 6–20)
CO2: 24 mmol/L (ref 22–32)
Calcium: 8.5 mg/dL — ABNORMAL LOW (ref 8.9–10.3)
Chloride: 109 mmol/L (ref 98–111)
Creatinine, Ser: 0.88 mg/dL (ref 0.44–1.00)
GFR calc Af Amer: >60 mL/min (ref >60)
GFR calc non Af Amer: >60 mL/min (ref >60)
Glucose, Bld: 89 mg/dL (ref 70–99)
Potassium: 3.4 mmol/L — ABNORMAL LOW (ref 3.5–5.1)
Sodium: 140 mmol/L (ref 135–145)

## 2018-09-14 MED ORDER — SODIUM CHLORIDE 0.9% FLUSH: 3.0000 mL | Freq: Once | INTRAVENOUS | Status: DC

## 2018-09-14 MED ORDER — FERROUS SULFATE 325 (65 FE) MG PO TABS: 325.0000 mg | ORAL_TABLET | Freq: Every day | ORAL | 0 refills | Status: DC

## 2018-09-14 NOTE — Discharge Instructions (Addendum)
Continue taking home medications as prescribed. Take iron pills daily to increase your blood counts. Make sure you are staying well-hydrated water. Follow-up with the hematology, blood doctor, listed below for further evaluation of your chronic anemia and low platelet count. Return to the emergency room with any new, worsening, concerning symptoms.

## 2018-09-14 NOTE — ED Notes (Signed)
Pt states that while doing the orthostatic vital signs she felt "kinda weak but that's about it". Pt Alert and Oriented X 4. Pt appropriate.

## 2018-09-14 NOTE — ED Provider Notes (Signed)
[facility] EMERGENCY DEPARTMENT Provider Note   CSN: [id number] Arrival date & time: 09/14/18  9811     History   Chief Complaint Chief Complaint  Patient presents with  . Abnormal Lab    HPI Christine Todd is a 43 y.o. female presenting for evaluation of abnormal blood work.   Pt states she saw her GI and lupus (rheumatologist) doctors yesterday for persistent R sided flank pain x 10 months. She had blood work which was abnormal, and she was called to come to the ED. She reports she had heavy vaginal bleeding x3 wks which resolved 3 days ago. She denies hematemesis, hematochezia, or melena. She states she has been feeling more tired than normal this past week. She denies fevers, chills, cp, sob, cough, n/v, abd pain, urinary sxs, abnormal BMs. Pt reports a h/o lupus for which she is on prednisone, she is supposed to take Cellcept and plaquenil, but has not taken it recently. No other medical problems.  additional history obtained from chart review. Yesterday pt with wbc of 3.5, hgb 8.6, plts 82, iron 26. However, pt's baseline hgb is 8.5-10, wbc 3-4.5.      HPI  Past Medical History:  Diagnosis Date  . Anemia   . Arthritis    "hands and feet" (07/04/2014)  . GERD (gastroesophageal reflux disease)   . Headache    "weekly" (07/04/2014)  . History of blood transfusion 07/04/2014   "related to anemia"  . Lupus ([place])   . Pneumonia "several times"  . 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 SLE (systemic lupus erythematosus) ([place])     Patient Active Problem List   Diagnosis Date Noted  . Symptomatic anemia 07/04/2014  . Benign essential HTN 07/04/2014  . Hypokalemia 07/04/2014  . FOOT PAIN, LEFT 12/10/2009  . KNEE PAIN 11/13/2008  . SKIN RASH 11/13/2008  . UTI 11/09/2008  . Iron deficiency anemia 11/01/2008  . MICROSCOPIC HEMATURIA 11/01/2008  . INSOMNIA 07/31/2008  . HEADACHE 07/31/2008  . ABSCESS, SKIN 04/27/2008  . FREQUENCY, URINARY 03/01/2008   . OVARIAN CYST, RUPTURED 11/08/2007  . GERD 09/24/2007  . SLE 11/25/2006  . CANDIDIASIS 11/18/2006  . SORE THROAT 11/18/2006  . ABNORMAL RESULT, FUNCTION STUDY, LIVER 11/18/2006    Past Surgical History:  Procedure Laterality Date  . DILATION AND CURETTAGE OF UTERUS  2009  . RENAL BIOPSY, PERCUTANEOUS  ?2013     OB History   No obstetric history on file.      Home Medications    Prior to Admission medications   Medication Sig Start Date End Date Taking? Authorizing Provider  hydroxychloroquine (PLAQUENIL) 200 MG tablet Take 200 mg by mouth every evening.  09/28/17  Yes [provider]  lisinopril (PRINIVIL,ZESTRIL) 5 MG tablet Take 5 mg by mouth every evening.  10/26/17  Yes [provider]  mycophenolate (CELLCEPT) 500 MG tablet Take 500-1,000 mg by mouth See admin instructions. Take 1 capsule every morning and take 2 capsules every evening 04/15/18  Yes [provider]  predniSONE (DELTASONE) 5 MG tablet Take 5 mg by mouth every evening.  04/13/18  Yes [provider]  ferrous sulfate 325 (65 FE) MG tablet Take 1 tablet (325 mg total) by mouth daily. 09/14/18   Marquia Costello, PA-C  omeprazole (PRILOSEC) 40 MG capsule Take 40 mg by mouth daily. 09/13/18 10/13/18  [provider]    Family History Family History  Problem Relation Age of Onset  .  Diabetes Other   . Cancer Other   . Hypertension Other   . Diabetes Father     Social History Social History   Tobacco Use  . Smoking status: Never Smoker  . Smokeless tobacco: Never Used  Substance Use Topics  . Alcohol use: Yes    Comment: occasionally  . Drug use: No     Allergies   Sulfa antibiotics   Review of Systems Review of Systems  Constitutional:       Feeling tired  Allergic/Immunologic: Positive for immunocompromised state (on prednisone, is supposed to be on immunosupression).  All other systems reviewed and are negative.    Physical Exam Updated Vital  Signs BP (!) 163/91 (BP Location: Right Arm) Comment: Simultaneous filing. User may not have seen previous data.  Pulse 72 Comment: Simultaneous filing. User may not have seen previous data.  Temp 98.3 F (36.8 C) (Oral)   Resp 20 Comment: Simultaneous filing. User may not have seen previous data.  LMP 09/02/2018 (Exact Date)   SpO2 100% Comment: Simultaneous filing. User may not have seen previous data.  Physical Exam Vitals signs and nursing note reviewed.  Constitutional:      General: She is not in acute distress.    Appearance: She is well-developed.     Comments: Appears nontoxic  HENT:     Head: Normocephalic and atraumatic.  Eyes:     Extraocular Movements: Extraocular movements intact.     Conjunctiva/sclera: Conjunctivae normal.     Pupils: Pupils are equal, round, and reactive to light.  Neck:     Musculoskeletal: Normal range of motion and neck supple.  Cardiovascular:     Rate and Rhythm: Normal rate and regular rhythm.     Pulses: Normal pulses.  Pulmonary:     Effort: Pulmonary effort is normal. No respiratory distress.     Breath sounds: Normal breath sounds. No wheezing.     Comments: Speaking in full sentences, clear lung sounds in all fields.  Abdominal:     General: There is no distension.     Palpations: Abdomen is soft. There is no mass.     Tenderness: There is no abdominal tenderness. There is no guarding or rebound.  Musculoskeletal: Normal range of motion.  Skin:    General: Skin is warm and dry.     Capillary Refill: Capillary refill takes less than 2 seconds.  Neurological:     Mental Status: She is alert and oriented to person, place, and time.      ED Treatments / Results  Labs (all labs ordered are listed, but only abnormal results are displayed) Labs Reviewed  BASIC METABOLIC PANEL - Abnormal; Notable for the following components:      Result Value   Potassium 3.4 (*)    Calcium 8.5 (*)    All other components within normal limits   CBC - Abnormal; Notable for the following components:   RBC 3.24 (*)    Hemoglobin 8.4 (*)    HCT 28.8 (*)    MCH 25.9 (*)    MCHC 29.2 (*)    RDW 16.1 (*)    Platelets 129 (*)    All other components within normal limits  CBC WITH DIFFERENTIAL/PLATELET - Abnormal; Notable for the following components:   RBC 3.25 (*)    Hemoglobin 8.5 (*)    HCT 28.6 (*)    MCHC 29.7 (*)    RDW 16.0 (*)    Platelets 124 (*)    All  other components within normal limits  I-STAT BETA HCG BLOOD, ED ([facility], [facility], [facility] ONLY)    EKG None  Radiology No results found.  Procedures Procedures (including critical care time)  Medications Ordered in ED Medications  sodium chloride flush (NS) 0.9 % injection 3 mL (has no administration in time range)     Initial Impression / Assessment and Plan / ED Course  I have reviewed the triage vital signs and the nursing notes.  Pertinent labs & imaging results that were available during my care of the patient were reviewed by me and considered in my medical decision making (see chart for details).        Pt presenting for evaluation of abnormal lab values. Also reports increased feelings of tiredness. Labs from yesterday reviewed, overall not too far from baseline, plts lower than baseline. Will repeat labs today. Orthostatics ordered.   Labs today reassuring, hgb at baseline. Wbc improved to 4.5. plts improved to ~130. Orthostatics negative. discussed findings with attending, Dr. Regenia Skeeter agrees to plan. Discussed findings and plan with pt. Discussed tx with iron pills and follow up with heme-onc for further evaluation/management of chronic anemia and thrombocytopenia. At this time, pt appears safe for d/c. Return precautions given. Pt states she understands and agrees to plan.   Final Clinical Impressions(s) / ED Diagnoses   Final diagnoses:  Chronic anemia  Thrombocytopenia [facility])    ED Discharge Orders         Ordered    ferrous sulfate 325 (65 FE) MG  tablet  Daily     09/14/18 1330           Dyllin Gulley, PA-C 09/14/18 1459    Sherwood Gambler, MD 09/14/18 1538

## 2018-09-14 NOTE — ED Notes (Signed)
Pt given discharge papers, verbalizes understanding of discharge papers.

## 2018-09-14 NOTE — ED Triage Notes (Signed)
Patient reports her doctor called and states her blood counts were abnormal. States she doesn't know exactly which ones. Denies pain, dizziness, shortness of breath, fevers/chills.

## 2018-09-14 NOTE — ED Notes (Signed)
Lab to add on differential to pts CBC

## 2019-07-16 ENCOUNTER — Emergency Department (HOSPITAL_COMMUNITY): Payer: BC Managed Care – PPO

## 2019-07-16 ENCOUNTER — Other Ambulatory Visit: Payer: Self-pay

## 2019-07-16 ENCOUNTER — Emergency Department (HOSPITAL_COMMUNITY)
Admission: EM | Admit: 2019-07-16 | Discharge: 2019-07-16 | Disposition: A | Discharge status: Home / Self Care | Payer: BC Managed Care – PPO | Attending: Emergency Medicine | Admitting: Emergency Medicine

## 2019-07-16 DIAGNOSIS — R197 Diarrhea, unspecified: Secondary | ICD-10-CM | POA: Diagnosis not present

## 2019-07-16 DIAGNOSIS — I10 Essential (primary) hypertension: Secondary | ICD-10-CM | POA: Diagnosis not present

## 2019-07-16 DIAGNOSIS — R1030 Lower abdominal pain, unspecified: Secondary | ICD-10-CM | POA: Diagnosis present

## 2019-07-16 DIAGNOSIS — N179 Acute kidney failure, unspecified: Secondary | ICD-10-CM | POA: Insufficient documentation

## 2019-07-16 DIAGNOSIS — I517 Cardiomegaly: Secondary | ICD-10-CM | POA: Diagnosis not present

## 2019-07-16 LAB — URINALYSIS, ROUTINE W REFLEX MICROSCOPIC
Bilirubin Urine: NEGATIVE
Glucose, UA: NEGATIVE mg/dL
Ketones, ur: 5 mg/dL — AB
Nitrite: NEGATIVE
Protein, ur: NEGATIVE mg/dL
Specific Gravity, Urine: 1.015 (ref 1.005–1.030)
pH: 5 (ref 5.0–8.0)

## 2019-07-16 LAB — CBC
HCT: 36.1 % (ref 36.0–46.0)
Hemoglobin: 11.2 g/dL — ABNORMAL LOW (ref 12.0–15.0)
MCH: 28.9 pg (ref 26.0–34.0)
MCHC: 31 g/dL (ref 30.0–36.0)
MCV: 93.3 fL (ref 80.0–100.0)
Platelets: 153 10*3/uL (ref 150–400)
RBC: 3.87 MIL/uL (ref 3.87–5.11)
RDW: 13.2 % (ref 11.5–15.5)
WBC: 4.9 10*3/uL (ref 4.0–10.5)
nRBC: 0 % (ref 0.0–0.2)

## 2019-07-16 LAB — COMPREHENSIVE METABOLIC PANEL
ALT: 18 U/L (ref 0–44)
AST: 31 U/L (ref 15–41)
Albumin: 3.9 g/dL (ref 3.5–5.0)
Alkaline Phosphatase: 50 U/L (ref 38–126)
Anion gap: 10 (ref 5–15)
BUN: 27 mg/dL — ABNORMAL HIGH (ref 6–20)
CO2: 21 mmol/L — ABNORMAL LOW (ref 22–32)
Calcium: 9.2 mg/dL (ref 8.9–10.3)
Chloride: 107 mmol/L (ref 98–111)
Creatinine, Ser: 1.42 mg/dL — ABNORMAL HIGH (ref 0.44–1.00)
GFR calc Af Amer: 53 mL/min — ABNORMAL LOW (ref >60)
GFR calc non Af Amer: 46 mL/min — ABNORMAL LOW (ref >60)
Glucose, Bld: 75 mg/dL (ref 70–99)
Potassium: 4.9 mmol/L (ref 3.5–5.1)
Sodium: 138 mmol/L (ref 135–145)
Total Bilirubin: 0.6 mg/dL (ref 0.3–1.2)
Total Protein: 9.1 g/dL — ABNORMAL HIGH (ref 6.5–8.1)

## 2019-07-16 LAB — TROPONIN I (HIGH SENSITIVITY): Troponin I (High Sensitivity): 3 ng/L (ref <18)

## 2019-07-16 LAB — I-STAT BETA HCG BLOOD, ED (MC, WL, AP ONLY): I-stat hCG, quantitative: <5 m[IU]/mL (ref <5)

## 2019-07-16 LAB — LIPASE, BLOOD: Lipase: 36 U/L (ref 11–51)

## 2019-07-16 MED ORDER — ONDANSETRON HCL 4 MG/2ML IJ SOLN
4.0000 mg | Freq: Once | INTRAMUSCULAR | Status: AC
  Administered 2019-07-16: 4 mg via INTRAVENOUS
  Filled 2019-07-16: qty 2

## 2019-07-16 MED ORDER — ONDANSETRON 4 MG PO TBDP: 4.0000 mg | ORAL_TABLET | Freq: Three times a day (TID) | ORAL | 0 refills | Status: DC | PRN

## 2019-07-16 MED ORDER — IOHEXOL 300 MG/ML  SOLN
100.0000 mL | Freq: Once | INTRAMUSCULAR | Status: AC | PRN
  Administered 2019-07-16: 100 mL via INTRAVENOUS

## 2019-07-16 MED ORDER — SODIUM CHLORIDE 0.9 % IV BOLUS
500.0000 mL | Freq: Once | INTRAVENOUS | Status: AC
  Administered 2019-07-16: 500 mL via INTRAVENOUS

## 2019-07-16 MED ORDER — MORPHINE SULFATE (PF) 4 MG/ML IV SOLN
4.0000 mg | Freq: Once | INTRAVENOUS | Status: AC
  Administered 2019-07-16: 4 mg via INTRAVENOUS
  Filled 2019-07-16: qty 1

## 2019-07-16 MED ORDER — SODIUM CHLORIDE (PF) 0.9 % IJ SOLN
INTRAMUSCULAR | Status: AC
  Filled 2019-07-16: qty 50

## 2019-07-16 MED ORDER — SODIUM CHLORIDE 0.9% FLUSH
3.0000 mL | Freq: Once | INTRAVENOUS | Status: AC
  Administered 2019-07-16: 3 mL via INTRAVENOUS

## 2019-07-16 NOTE — ED Notes (Signed)
Called lab, added Troponin to blood work sent down

## 2019-07-16 NOTE — ED Provider Notes (Addendum)
[facility] DEPT Provider Note   CSN: [id number] Arrival date & time: 07/16/19  0850     History Chief Complaint  Patient presents with   Fatigue   Abdominal Pain    Christine Todd is a 44 y.o. female history of lupus, GERD, hypertension, arthritis, anemia.  Patient brought in by EMS today for fatigue, generalized weakness, lower abdominal pain and diarrhea.  Symptoms began around 1 week ago.  Patient reports that she has lost several pounds in weight and has lost her appetite.  Symptoms have progressively worsened.  She describes lower abdominal pain as a bilateral, left worse than right, cramping sensation constant nonradiating no clear aggravating or alleviating factors.  Associated with multiple episodes of nonbloody diarrhea.  He reports nausea without vomiting.  Reports that she has felt generally tired and weak over the past several days without focal weakness.  Denies fever/chills, chest pain/shortness of breath, cough/hemoptysis, focal weakness, headache/vision changes, fall/injury, dysuria/hematuria or any additional concerns.  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"   SLE (systemic lupus erythematosus) ([place])     Patient Active Problem List   Diagnosis Date Noted   Symptomatic anemia 07/04/2014   Benign essential HTN 07/04/2014   Hypokalemia 07/04/2014   FOOT PAIN, LEFT 12/10/2009   KNEE PAIN 11/13/2008   SKIN RASH 11/13/2008   UTI 11/09/2008   Iron deficiency anemia 11/01/2008   MICROSCOPIC HEMATURIA 11/01/2008   INSOMNIA 07/31/2008   HEADACHE 07/31/2008   ABSCESS, SKIN 04/27/2008   FREQUENCY, URINARY 03/01/2008    OVARIAN CYST, RUPTURED 11/08/2007   GERD 09/24/2007   SLE 11/25/2006   CANDIDIASIS 11/18/2006   SORE THROAT 11/18/2006   ABNORMAL RESULT, FUNCTION STUDY, LIVER 11/18/2006    Past Surgical History:  Procedure Laterality Date   DILATION AND CURETTAGE OF UTERUS  2009   RENAL BIOPSY, PERCUTANEOUS  ?2013     OB History   No obstetric history on file.     Family History  Problem Relation Age of Onset   Diabetes Other    Cancer Other    Hypertension Other    Diabetes Father     Social History   Tobacco Use   Smoking status: Never Smoker   Smokeless tobacco: Never Used  Vaping Use   Vaping Use: Never used  Substance Use Topics   Alcohol use: Yes    Comment: occasionally   Drug use: No    Home Medications Prior to Admission medications   Medication Sig Start Date End Date Taking? Authorizing Provider  Cholecalciferol (DIALYVITE VITAMIN D 5000 PO) Take 10,000 Units by mouth daily.   Yes [provider]  hydroxychloroquine (PLAQUENIL) 200 MG tablet Take 400 mg by mouth every evening.  09/28/17  Yes [provider]  lisinopril (PRINIVIL,ZESTRIL) 5 MG tablet Take 5 mg by mouth every evening.  10/26/17  Yes [provider]  mycophenolate (CELLCEPT) 500 MG tablet Take 1,000 mg by mouth 2 (two) times daily. Take 1 capsule every morning and take 2 capsules every evening 04/15/18  Yes [provider]  POTASSIUM PO Take 1 tablet by  mouth daily.   Yes [provider]  predniSONE (DELTASONE) 5 MG tablet Take 5 mg by mouth every evening.  04/13/18  Yes [provider]  ferrous sulfate 325 (65 FE) MG tablet Take 1 tablet (325 mg total) by mouth daily. Patient not taking: Reported on 07/16/2019 09/14/18   Caccavale, Sophia, PA-C  ondansetron (ZOFRAN ODT) 4 MG disintegrating tablet Take 1 tablet (4 mg total) by mouth every 8 (eight) hours as needed for nausea or vomiting. 07/16/19   Nuala Alpha A, PA-C    Allergies     Sulfa antibiotics  Review of Systems   Review of Systems Ten systems are reviewed and are negative for acute change except as noted in the HPI  Physical Exam Updated Vital Signs BP (!) 131/92 (BP Location: Left Arm)    Pulse 76    Temp (!) 97.4 F (36.3 C) (Oral)    Resp 16    Ht 5' 7.5" (1.715 m)    Wt 70.3 kg    LMP 07/05/2019    SpO2 98%    BMI 23.92 kg/m   Physical Exam Constitutional:      General: She is not in acute distress.    Appearance: Normal appearance. She is well-developed. She is not ill-appearing or diaphoretic.  HENT:     Head: Normocephalic and atraumatic.  Eyes:     General: Vision grossly intact. Gaze aligned appropriately.     Pupils: Pupils are equal, round, and reactive to light.  Neck:     Trachea: Trachea and phonation normal.  Cardiovascular:     Rate and Rhythm: Normal rate and regular rhythm.  Pulmonary:     Effort: Pulmonary effort is normal. No respiratory distress.     Breath sounds: Normal breath sounds.  Abdominal:     General: There is no distension.     Palpations: Abdomen is soft.     Tenderness: There is abdominal tenderness in the left lower quadrant. There is no guarding or rebound. Negative signs include Murphy's sign and McBurney's sign.  Genitourinary:    Comments: Deferred by patient Musculoskeletal:        General: Normal range of motion.     Cervical back: Normal range of motion.  Skin:    General: Skin is warm and dry.  Neurological:     Mental Status: She is alert.     GCS: GCS eye subscore is 4. GCS verbal subscore is 5. GCS motor subscore is 6.     Comments: Speech is clear and goal oriented, follows commands Major Cranial nerves without deficit, no facial droop Moves extremities without ataxia, coordination intact  Psychiatric:        Behavior: Behavior normal.     ED Results / Procedures / Treatments   Labs (all labs ordered are listed, but only abnormal results are displayed) Labs Reviewed  COMPREHENSIVE  METABOLIC PANEL - Abnormal; Notable for the following components:      Result Value   CO2 21 (*)    BUN 27 (*)    Creatinine, Ser 1.42 (*)    Total Protein 9.1 (*)    GFR calc non Af Amer 46 (*)    GFR calc Af Amer 53 (*)    All other components within normal limits  CBC - Abnormal; Notable for the following components:   Hemoglobin 11.2 (*)    All other components within normal limits  URINALYSIS, ROUTINE W REFLEX MICROSCOPIC - Abnormal; Notable for the following components:  APPearance HAZY (*)    Hgb urine dipstick SMALL (*)    Ketones, ur 5 (*)    Leukocytes,Ua SMALL (*)    Bacteria, UA RARE (*)    All other components within normal limits  URINE CULTURE  LIPASE, BLOOD  I-STAT BETA HCG BLOOD, ED ([facility], [facility], [facility] ONLY)  TROPONIN I (HIGH SENSITIVITY)    EKG EKG Interpretation  Date/Time:  Sunday July 16 2019 09:10:33 EDT Ventricular Rate:  73 PR Interval:    QRS Duration: 103 QT Interval:  412 QTC Calculation: 465 R Axis:   28 Text Interpretation: Sinus rhythm Borderline short PR interval No acute changes Confirmed by Varney Biles [phone]) on 07/16/2019 1:55:17 PM   Radiology CT ABDOMEN PELVIS W CONTRAST  Result Date: 07/16/2019 CLINICAL DATA:  Lower abdominal pain and cramping. Concern for diverticulitis. EXAM: CT ABDOMEN AND PELVIS WITH CONTRAST TECHNIQUE: Multidetector CT imaging of the abdomen and pelvis was performed using the standard protocol following bolus administration of intravenous contrast. CONTRAST:  125mL OMNIPAQUE IOHEXOL 300 MG/ML  SOLN COMPARISON:  CT abdomen pelvis-06/14/2015 FINDINGS: Lower chest: Limited visualization of the lower thorax demonstrates bibasilar coarsened reticular opacities without associated subpleural honeycombing similar to the 2017 examination without significant interval progression. No pleural effusion. Borderline cardiomegaly.  No pericardial effusion. Hepatobiliary: Normal hepatic contour. No discrete hepatic lesions. Normal  appearance of the gallbladder given degree distention. No intra or extrahepatic biliary ductal dilatation. No ascites. Pancreas: Normal appearance of the pancreas. Spleen: Normal appearance of the spleen. Adrenals/Urinary Tract: There is symmetric enhancement and excretion of the bilateral kidneys. Bilateral hypoattenuating renal lesions are too small to accurately characterize though favored to represent renal cysts. No urine obstruction or perinephric stranding. Normal appearance the bilateral adrenal glands. Normal appearance of the urinary bladder given underdistention. Stomach/Bowel: Moderate to large colonic stool burden without evidence of enteric obstruction. Normal appearance of the terminal ileum and the retrocecal appendix. No discrete areas of bowel wall thickening. No pneumoperitoneum, pneumatosis or portal venous gas. Vascular/Lymphatic: Normal caliber of the abdominal aorta. The major branch vessels of the abdominal aorta appear patent on this non CTA examination. Scattered bilateral pelvic lymph nodes are prominent though morphologically similar to the 2017 examination and thus favored to be reactive in etiology with index left pelvic sidewall lymph node measuring 1.1 cm in greatest short axis diameter (image 71, series 2) and index right external iliac chain lymph node measuring 1.3 cm (image 76, series 2), likely reactive due to patient has significantly myomatous uterus. No definitive bulky retroperitoneal, mesenteric, pelvic or inguinal lymphadenopathy. Reproductive: Significant progression of multiple uterine fibroids compared to the 2017 examination with index fibroid within the right-side of the uterine body measuring approximately 8.3 x 7.5 x 5.9 cm (coronal image 66, series 4; axial image 68, series 2) and exophytic left-sided potentially broad ligament fibroid measuring 7.1 x 6.3 x 5.6 cm (coronal image 38, series 4; axial image 65, series 2). No discrete adnexal lesion. Other: Tiny  mesenteric fat containing periumbilical hernia. Musculoskeletal: No acute or aggressive osseous abnormalities. A bone island is seen within the right ilium. Note is made of a small left-sided acetabuli. IMPRESSION: 1. Significant progression of fibroid uterus with dominant fibroid involving the right-side of the uterine body measuring approximately 8.3 cm in diameter. Clinical correlation is advised. Further evaluation with pelvic ultrasound could be performed as indicated. 2. Otherwise, no explanation for patient's lower abdominal pain and cramping. Specifically, no evidence of diverticulitis, enteric or urinary obstruction. Normal appearance of the  appendix. 3. Fibrotic change within the imaged lung bases, grossly unchanged compared to the 2017 examination. 4. Presumably reactive prominent pelvic lymph nodes morphologically similar to the 2017 examination. Electronically Signed   By: Sandi Mariscal M.D.   On: 07/16/2019 12:02   DG Chest Portable 1 View  Result Date: 07/16/2019 CLINICAL DATA:  Fatigue.  History of lupus. EXAM: PORTABLE CHEST 1 VIEW COMPARISON:  01/27/2017; chest CT-05/19/2018 FINDINGS: Grossly unchanged enlarged cardiac silhouette. Normal mediastinal contours. Improved aeration of the lungs with minimal ill-defined reticular bibasilar opacities. No new focal airspace opacities. No pleural effusion or pneumothorax no evidence of edema. No acute osseous abnormalities. IMPRESSION: 1. No acute cardiopulmonary disease. 2. Improved aeration of lungs with persistent bibasilar reticular opacities compatible with known fibrosis. 3. Cardiomegaly without evidence of edema. Electronically Signed   By: Sandi Mariscal M.D.   On: 07/16/2019 10:14    Procedures Procedures (including critical care time)  Medications Ordered in ED Medications  sodium chloride (PF) 0.9 % injection (has no administration in time range)  sodium chloride 0.9 % bolus 500 mL (has no administration in time range)  sodium chloride  flush (NS) 0.9 % injection 3 mL (3 mLs Intravenous Given 07/16/19 0943)  sodium chloride 0.9 % bolus 500 mL (0 mLs Intravenous Stopped 07/16/19 1204)  morphine 4 MG/ML injection 4 mg (4 mg Intravenous Given 07/16/19 1125)  iohexol (OMNIPAQUE) 300 MG/ML solution 100 mL (100 mLs Intravenous Contrast Given 07/16/19 1131)  ondansetron (ZOFRAN) injection 4 mg (4 mg Intravenous Given 07/16/19 1153)  sodium chloride 0.9 % bolus 500 mL (0 mLs Intravenous Stopped 07/16/19 1358)    ED Course  I have reviewed the triage vital signs and the nursing notes.  Pertinent labs & imaging results that were available during my care of the patient were reviewed by me and considered in my medical decision making (see chart for details).    MDM Rules/Calculators/A&P                         Additional History Obtained: 1. Nursing notes from this visit.  Per nursing note it appears patient was hypotensive at 82/60 on EMS arrival, was given 1 L fluid bolus with improvement.  Zofran given for nausea. 2. No pertinent ER visits this year.  Patient had an ER visit on 09/14/2019 with diagnosis of chronic anemia and thrombocytopenia. - I ordered, reviewed and interpreted labs which include: Pregnancy test negative, doubt ectopic High-sensitivity troponin negative, multiple days of symptoms no chest pain or shortness of breath doubt ACS Urinalysis shows 6-10 squamous cells, rare bacteria, 6/10 WBCs suspect secondary to contamination, patient without urinary symptoms doubt UTI.  Urine culture sent.  Ketones suggesting dehydration.  Patient is not a diabetic does not appear to be in DKA. CMP shows mild elevation of creatinine at 1.42, no emergent electrolyte derangement, elevation of LFTs or gap.  Patient given IV fluid for mild AKI. CBC shows mild anemia of 11.2 which is improved from prior, no leukocytosis.  EKG: Sinus rhythm Borderline short PR interval No acute changes Confirmed by Varney Biles [phone]) on 07/16/2019 1:55:17  PM  DG Chest:  IMPRESSION:  1. No acute cardiopulmonary disease.  2. Improved aeration of lungs with persistent bibasilar reticular  opacities compatible with known fibrosis.  3. Cardiomegaly without evidence of edema.  I personally reviewed patient's chest x-ray and agree with radiologist interpretation.  CT AP:  IMPRESSION:  1. Significant progression of fibroid uterus with dominant  fibroid  involving the right-side of the uterine body measuring approximately  8.3 cm in diameter. Clinical correlation is advised. Further  evaluation with pelvic ultrasound could be performed as indicated.  2. Otherwise, no explanation for patient's lower abdominal pain and  cramping. Specifically, no evidence of diverticulitis, enteric or  urinary obstruction. Normal appearance of the appendix.  3. Fibrotic change within the imaged lung bases, grossly unchanged  compared to the 2017 examination.  4. Presumably reactive prominent pelvic lymph nodes morphologically  similar to the 2017 examination.  - Patient was reevaluated she is resting comfortably in bed no acute distress.  I discussed in detail patient's findings today and she stated understanding.  I again offered patient a pelvic examination for further evaluation of her pain and of the uterine fibroids, she refused.  Patient wishes to instead follow-up with her OB/GYN for a recheck, feels is reasonable at this time.  Additionally patient reports that she is not sexually active so I have low suspicion for PID as cause of her symptoms today.  Additionally given mildness of her pain associated diarrhea lower suspicion for torsion or other emergent pelvic pathologies at this time.  Patient was informed of her AKI today, she is given multiple fluid boluses.  Suspect her AKI is likely from dehydration decreased p.o. intake possibly from a viral gastroenteritis.  Patient refused Covid test today.  There is no evidence for bacterial infection requiring  antibiotics at this time.  Patient is tolerating food and water here in the emergency department and is requesting discharge.  Will prescribe patient ODT Zofran and encourage her to see her primary care doctor this week for a recheck of her labs.  At this time there does not appear to be any evidence of an acute emergency medical condition and the patient appears stable for discharge with appropriate outpatient follow up. Diagnosis was discussed with patient who verbalizes understanding of care plan and is agreeable to discharge. I have discussed return precautions with patient who verbalizes understanding. Patient encouraged to follow-up with their PCP. All questions answered.  Patient's case discussed with Dr. Kathrynn Humble who agrees with plan to discharge with PCP and OBGYN follow-up.   Note: Portions of this report may have been transcribed using voice recognition software. Every effort was made to ensure accuracy; however, inadvertent computerized transcription errors may still be present. Final Clinical Impression(s) / ED Diagnoses Final diagnoses:  AKI (acute kidney injury) ([place])  Diarrhea, unspecified type    Rx / DC Orders ED Discharge Orders         Ordered    ondansetron (ZOFRAN ODT) 4 MG disintegrating tablet  Every 8 hours PRN     Discontinue  Reprint     07/16/19 1429            Deliah Boston, PA-C 07/16/19 [place], Ankit, MD 07/16/19 1612

## 2019-07-16 NOTE — ED Notes (Signed)
Pt tolerating PO Fluids and Solids

## 2019-07-16 NOTE — ED Triage Notes (Signed)
Pt to ED by [facility] from work with c/o of fatigue, weakness, lower abdominal cramping with diarrhea for the past week that has progressively gotten worse. Pt does have Lupus and thought it would resolve, tried to get in to see Rheumatologist but no appointments available. Pt is Hypotensive on [facility] arrival 82/60, was given 1000 bolus NS and BP came up to 130/80, and 4 of Zofran.

## 2019-07-16 NOTE — ED Notes (Signed)
Pt unable to void; will continue to monitor.  

## 2019-07-16 NOTE — Discharge Instructions (Addendum)
At this time there does not appear to be the presence of an emergent medical condition, however there is always the potential for conditions to change. Please read and follow the below instructions.  Please return to the Emergency Department immediately for any new or worsening symptoms. Please be sure to follow up with your Primary Care Provider within one week regarding your visit today; please call their office to schedule an appointment even if you are feeling better for a follow-up visit. Your labs today were suggestive of dehydration and acute kidney injury.  You were given IV fluid today to help with this.  You will need your labs rechecked by your primary care provider this week to ensure improvement.  Please continue to drink plenty of water to avoid dehydration and get plenty of rest. Your CT scan showed worsening of your fibroids, please follow-up with your OB/GYN for further evaluation.  Your CT scan and x-rays also showed fibrotic changes of your lungs and prominent pelvic lymph nodes.  Please discuss these incidental findings with your primary care doctor at your follow-up visit. You may use the nausea medication Zofran as prescribed.  This will dissolve under your tongue, do not swallow it.  Get help right away if: You develop symptoms of worsening kidney disease, which include: Headaches. Abnormally dark or light skin. Easy bruising. Frequent hiccups. Chest pain. Shortness of breath. End of menstruation in women. Seizures. Confusion or altered mental status. Abdominal or back pain. Itchiness. You have a fever. Your body is producing less urine. You have pain or bleeding when you urinate. You have any new/concerning or worsening of symptoms  Please read the additional information packets attached to your discharge summary.  Do not take your medicine if  develop an itchy rash, swelling in your mouth or lips, or difficulty breathing; call 911 and seek immediate emergency  medical attention if this occurs.  Note: Portions of this text may have been transcribed using voice recognition software. Every effort was made to ensure accuracy; however, inadvertent computerized transcription errors may still be present.

## 2019-07-18 LAB — URINE CULTURE: Culture: NO GROWTH

## 2020-02-13 DIAGNOSIS — M3214 Glomerular disease in systemic lupus erythematosus: Secondary | ICD-10-CM | POA: Diagnosis not present

## 2020-03-25 DIAGNOSIS — Z79899 Other long term (current) drug therapy: Secondary | ICD-10-CM | POA: Diagnosis not present

## 2020-03-25 DIAGNOSIS — M329 Systemic lupus erythematosus, unspecified: Secondary | ICD-10-CM | POA: Diagnosis not present

## 2020-03-30 ENCOUNTER — Emergency Department (HOSPITAL_COMMUNITY)
Admission: EM | Admit: 2020-03-30 | Discharge: 2020-03-30 | Disposition: A | Discharge status: Home / Self Care | Payer: BC Managed Care – PPO | Attending: Emergency Medicine | Admitting: Emergency Medicine

## 2020-03-30 ENCOUNTER — Other Ambulatory Visit: Payer: Self-pay

## 2020-03-30 ENCOUNTER — Encounter (HOSPITAL_COMMUNITY): Payer: Self-pay | Admitting: Emergency Medicine

## 2020-03-30 DIAGNOSIS — Z79899 Other long term (current) drug therapy: Secondary | ICD-10-CM | POA: Diagnosis not present

## 2020-03-30 DIAGNOSIS — G43811 Other migraine, intractable, with status migrainosus: Secondary | ICD-10-CM | POA: Insufficient documentation

## 2020-03-30 DIAGNOSIS — I10 Essential (primary) hypertension: Secondary | ICD-10-CM | POA: Diagnosis not present

## 2020-03-30 DIAGNOSIS — G43801 Other migraine, not intractable, with status migrainosus: Secondary | ICD-10-CM

## 2020-03-30 DIAGNOSIS — R519 Headache, unspecified: Secondary | ICD-10-CM | POA: Diagnosis present

## 2020-03-30 MED ORDER — DEXAMETHASONE SODIUM PHOSPHATE 10 MG/ML IJ SOLN
6.0000 mg | Freq: Once | INTRAMUSCULAR | Status: AC
  Administered 2020-03-30: 6 mg via INTRAVENOUS
  Filled 2020-03-30: qty 1

## 2020-03-30 MED ORDER — PROCHLORPERAZINE EDISYLATE 10 MG/2ML IJ SOLN
10.0000 mg | Freq: Once | INTRAMUSCULAR | Status: AC
  Administered 2020-03-30: 10 mg via INTRAVENOUS
  Filled 2020-03-30: qty 2

## 2020-03-30 MED ORDER — SUMATRIPTAN SUCCINATE 50 MG PO TABS: 50.0000 mg | ORAL_TABLET | ORAL | 0 refills | Status: DC | PRN

## 2020-03-30 MED ORDER — DIPHENHYDRAMINE HCL 50 MG/ML IJ SOLN
12.5000 mg | Freq: Once | INTRAMUSCULAR | Status: AC
  Administered 2020-03-30: 12.5 mg via INTRAVENOUS
  Filled 2020-03-30: qty 1

## 2020-03-30 MED ORDER — KETOROLAC TROMETHAMINE 30 MG/ML IJ SOLN
15.0000 mg | Freq: Once | INTRAMUSCULAR | Status: AC
  Administered 2020-03-30: 15 mg via INTRAVENOUS
  Filled 2020-03-30: qty 1

## 2020-03-30 NOTE — Discharge Instructions (Signed)
Take the medications as needed for headache. Return to the ER for worsening symptoms, fever, numbness or weakness. Follow-up with your doctor if the symptoms do not resolve over the next couple of days

## 2020-03-30 NOTE — ED Triage Notes (Signed)
C/o migraine x 1 week with nausea and light sensitivity.

## 2020-03-30 NOTE — ED Provider Notes (Signed)
[facility] Provider Note   CSN: [id number] Arrival date & time: 03/30/20  8676     History Chief Complaint  Patient presents with  . Migraine    Christine Todd is a 45 y.o. female.  HPI   Patient presents to the ED for evaluation of a persistent headache.  Patient states she has history of migraines but they are not too frequent.  Usually she just takes Tylenol.  This past week however she has been having intermittent waxing and waning headache.  Patient states the pain is primarily on the left side behind her eye.  The other day at work pain was more severe and she had to leave work.  She tried taking several doses of Tylenol without relief.  This morning when she woke up again she was having a headache.  She does not take NSAIDs because of her history of lupus.  Does not have any prescribed migraine medications.  She denies any fevers or chills.  No recent injury.  No numbness or weakness.  No trouble with her balance or speech.  She is feeling nauseated and the light does exacerbate her symptoms.  Past Medical History:  Diagnosis Date  . Anemia   . Arthritis    "hands and feet" (07/04/2014)  . GERD (gastroesophageal reflux disease)   . Headache    "weekly" (07/04/2014)  . History of blood transfusion 07/04/2014   "related to anemia"  . Lupus ([place])   . Pneumonia "several times"  . 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 SLE (systemic lupus erythematosus) ([place])     Patient Active Problem List   Diagnosis Date Noted  . Symptomatic anemia 07/04/2014  . Benign essential HTN 07/04/2014  . Hypokalemia 07/04/2014  . FOOT PAIN, LEFT 12/10/2009  . KNEE PAIN 11/13/2008  . SKIN RASH 11/13/2008  . UTI 11/09/2008  . Iron deficiency anemia 11/01/2008  . MICROSCOPIC HEMATURIA 11/01/2008  . INSOMNIA 07/31/2008  . HEADACHE 07/31/2008  . ABSCESS, SKIN 04/27/2008  . FREQUENCY, URINARY 03/01/2008  . OVARIAN CYST, RUPTURED 11/08/2007  . GERD  09/24/2007  . SLE 11/25/2006  . CANDIDIASIS 11/18/2006  . SORE THROAT 11/18/2006  . ABNORMAL RESULT, FUNCTION STUDY, LIVER 11/18/2006    Past Surgical History:  Procedure Laterality Date  . DILATION AND CURETTAGE OF UTERUS  2009  . RENAL BIOPSY, PERCUTANEOUS  ?2013     OB History   No obstetric history on file.     Family History  Problem Relation Age of Onset  . Diabetes Other   . Cancer Other   . Hypertension Other   . Diabetes Father     Social History   Tobacco Use  . Smoking status: Never Smoker  . Smokeless tobacco: Never Used  Vaping Use  . Vaping Use: Never used  Substance Use Topics  . Alcohol use: Yes    Comment: occasionally  . Drug use: No    Home Medications Prior to Admission medications   Medication Sig Start Date End Date Taking? Authorizing Provider  SUMAtriptan (IMITREX) 50 MG tablet Take 1 tablet (50 mg total) by mouth every 2 (two) hours as needed for migraine. May repeat in 2 hours if headache persists or recurs. 03/30/20  Yes Dorie Rank, MD  Cholecalciferol (DIALYVITE VITAMIN D 5000 PO) Take 10,000 Units by mouth daily.    [provider]  ferrous sulfate 325 (65 FE) MG tablet Take 1 tablet (325 mg total) by  mouth daily. Patient not taking: Reported on 07/16/2019 09/14/18   Caccavale, Sophia, PA-C  hydroxychloroquine (PLAQUENIL) 200 MG tablet Take 400 mg by mouth every evening.  09/28/17   [provider]  lisinopril (PRINIVIL,ZESTRIL) 5 MG tablet Take 5 mg by mouth every evening.  10/26/17   [provider]  mycophenolate (CELLCEPT) 500 MG tablet Take 1,000 mg by mouth 2 (two) times daily. Take 1 capsule every morning and take 2 capsules every evening 04/15/18   [provider]  ondansetron (ZOFRAN ODT) 4 MG disintegrating tablet Take 1 tablet (4 mg total) by mouth every 8 (eight) hours as needed for nausea or vomiting. 07/16/19   Nuala Alpha A, PA-C  POTASSIUM PO Take 1 tablet by mouth daily.    [provider]  predniSONE (DELTASONE) 5 MG tablet Take 5 mg by mouth every evening.  04/13/18   [provider]    Allergies    Sulfa antibiotics  Review of Systems   Review of Systems  All other systems reviewed and are negative.   Physical Exam Updated Vital Signs BP (!) 140/96   Pulse 84   Temp 98 F (36.7 C)   Resp 17   SpO2 100%   Physical Exam Vitals and nursing note reviewed.  Constitutional:      General: She is not in acute distress.    Appearance: She is well-developed and well-nourished.  HENT:     Head: Normocephalic and atraumatic.     Right Ear: External ear normal.     Left Ear: External ear normal.     Nose: No congestion or rhinorrhea.  Eyes:     General: No scleral icterus.       Right eye: No discharge.        Left eye: No discharge.     Conjunctiva/sclera: Conjunctivae normal.  Neck:     Trachea: No tracheal deviation.  Cardiovascular:     Rate and Rhythm: Normal rate and regular rhythm.     Pulses: Intact distal pulses.  Pulmonary:     Effort: Pulmonary effort is normal. No respiratory distress.     Breath sounds: Normal breath sounds. No stridor. No wheezing or rales.  Abdominal:     General: Bowel sounds are normal. There is no distension.     Palpations: Abdomen is soft.     Tenderness: There is no abdominal tenderness. There is no guarding or rebound.  Musculoskeletal:        General: No tenderness or edema.     Cervical back: Normal range of motion and neck supple. No rigidity or tenderness.  Skin:    General: Skin is warm and dry.     Findings: No rash.  Neurological:     Mental Status: She is alert.     Cranial Nerves: No cranial nerve deficit (no facial droop, extraocular movements intact, no slurred speech).     Sensory: No sensory deficit.     Motor: No abnormal muscle tone or seizure activity.     Coordination: Coordination normal.     Deep Tendon Reflexes: Strength normal.  Psychiatric:        Mood and Affect:  Mood and affect normal.     ED Results / Procedures / Treatments    Radiology No results found.  Procedures Procedures   Medications Ordered in ED Medications  prochlorperazine (COMPAZINE) injection 10 mg (10 mg Intravenous Given 03/30/20 1118)  diphenhydrAMINE (BENADRYL) injection 12.5 mg (12.5 mg Intravenous Given 03/30/20 1118)  dexamethasone (DECADRON) injection 6 mg (6 mg Intravenous Given 03/30/20 1118)  ketorolac (TORADOL) 30 MG/ML injection 15 mg (15 mg Intravenous Given 03/30/20 1316)    ED Course  I have reviewed the triage vital signs and the nursing notes.  Pertinent labs & imaging results that were available during my care of the patient were reviewed by me and considered in my medical decision making (see chart for details).  Clinical Course as of 03/30/20 1327  Sat Mar 30, 2020  1305 HA is improving. [KG]  4010 Will give a low dose of toradol for pain with her medical history.   [JK]    Clinical Course User Index [JK] Dorie Rank, MD   MDM Rules/Calculators/A&P                          Patient presents to the ED for evaluation of persistent headache. Patient also has history of migraines. Symptoms are suggestive of migraine headache with photophobia and nausea. She has not have any focal neurologic deficits on exam. I doubt stroke TIA. Patient did not have thunderclap onset to suggest subarachnoid hemorrhage or cerebral hemorrhage. Patient's treatment improved with migraine cocktail. Discussed with her about not taking Tylenol more than the prescribed amount. I will also give her prescription for Imitrex to take if she has had headaches in the future that do not resolve with Tylenol. Final Clinical Impression(s) / ED Diagnoses Final diagnoses:  Other migraine with status migrainosus, not intractable    Rx / DC Orders ED Discharge Orders         Ordered    SUMAtriptan (IMITREX) 50 MG tablet  Every 2 hours PRN        03/30/20 1326           Dorie Rank,  MD 03/30/20 1328

## 2020-03-31 ENCOUNTER — Encounter (HOSPITAL_COMMUNITY): Payer: Self-pay | Admitting: Emergency Medicine

## 2020-03-31 ENCOUNTER — Emergency Department (HOSPITAL_COMMUNITY)
Admission: EM | Admit: 2020-03-31 | Discharge: 2020-04-01 | Disposition: A | Discharge status: Home / Self Care | Payer: BC Managed Care – PPO | Attending: Emergency Medicine | Admitting: Emergency Medicine

## 2020-03-31 ENCOUNTER — Other Ambulatory Visit: Payer: Self-pay

## 2020-03-31 DIAGNOSIS — Z20822 Contact with and (suspected) exposure to covid-19: Secondary | ICD-10-CM | POA: Diagnosis not present

## 2020-03-31 DIAGNOSIS — J0130 Acute sphenoidal sinusitis, unspecified: Secondary | ICD-10-CM | POA: Diagnosis not present

## 2020-03-31 DIAGNOSIS — R700 Elevated erythrocyte sedimentation rate: Secondary | ICD-10-CM | POA: Diagnosis not present

## 2020-03-31 DIAGNOSIS — Z79899 Other long term (current) drug therapy: Secondary | ICD-10-CM | POA: Insufficient documentation

## 2020-03-31 DIAGNOSIS — J323 Chronic sphenoidal sinusitis: Secondary | ICD-10-CM

## 2020-03-31 DIAGNOSIS — R519 Headache, unspecified: Secondary | ICD-10-CM | POA: Diagnosis present

## 2020-03-31 DIAGNOSIS — I10 Essential (primary) hypertension: Secondary | ICD-10-CM | POA: Insufficient documentation

## 2020-03-31 MED ORDER — MAGNESIUM SULFATE 2 GM/50ML IV SOLN
2.0000 g | Freq: Once | INTRAVENOUS | Status: AC
  Administered 2020-03-31: 2 g via INTRAVENOUS
  Filled 2020-03-31: qty 50

## 2020-03-31 MED ORDER — SODIUM CHLORIDE 0.9 % IV BOLUS
500.0000 mL | Freq: Once | INTRAVENOUS | Status: AC
  Administered 2020-03-31: 500 mL via INTRAVENOUS

## 2020-03-31 MED ORDER — ONDANSETRON HCL 4 MG/2ML IJ SOLN
4.0000 mg | Freq: Once | INTRAMUSCULAR | Status: AC
  Administered 2020-03-31: 4 mg via INTRAVENOUS
  Filled 2020-03-31: qty 2

## 2020-03-31 NOTE — ED Notes (Addendum)
Patient went to bathroom, will medicate and draw blood once she returns.

## 2020-03-31 NOTE — ED Triage Notes (Signed)
Pt c/o continued migraine x 1 week. Seen yesterday for same, reports she has taken 2 doses of her medication with no relief.

## 2020-04-01 ENCOUNTER — Encounter (HOSPITAL_COMMUNITY): Payer: Self-pay | Admitting: Emergency Medicine

## 2020-04-01 ENCOUNTER — Emergency Department (HOSPITAL_COMMUNITY): Payer: BC Managed Care – PPO

## 2020-04-01 ENCOUNTER — Emergency Department (HOSPITAL_COMMUNITY)
Admission: EM | Admit: 2020-04-01 | Discharge: 2020-04-01 | Disposition: A | Discharge status: Home / Self Care | Payer: BC Managed Care – PPO | Source: Home / Self Care | Attending: Emergency Medicine | Admitting: Emergency Medicine

## 2020-04-01 ENCOUNTER — Other Ambulatory Visit: Payer: Self-pay

## 2020-04-01 DIAGNOSIS — I10 Essential (primary) hypertension: Secondary | ICD-10-CM | POA: Insufficient documentation

## 2020-04-01 DIAGNOSIS — J0130 Acute sphenoidal sinusitis, unspecified: Secondary | ICD-10-CM | POA: Insufficient documentation

## 2020-04-01 DIAGNOSIS — Z79899 Other long term (current) drug therapy: Secondary | ICD-10-CM | POA: Insufficient documentation

## 2020-04-01 DIAGNOSIS — Z20822 Contact with and (suspected) exposure to covid-19: Secondary | ICD-10-CM | POA: Insufficient documentation

## 2020-04-01 LAB — CBC WITH DIFFERENTIAL/PLATELET
Abs Immature Granulocytes: 0.02 10*3/uL (ref 0.00–0.07)
Abs Immature Granulocytes: 0.03 10*3/uL (ref 0.00–0.07)
Basophils Absolute: 0 10*3/uL (ref 0.0–0.1)
Basophils Absolute: 0 10*3/uL (ref 0.0–0.1)
Basophils Relative: 0 %
Basophils Relative: 0 %
Eosinophils Absolute: 0 10*3/uL (ref 0.0–0.5)
Eosinophils Absolute: 0 10*3/uL (ref 0.0–0.5)
Eosinophils Relative: 0 %
Eosinophils Relative: 0 %
HCT: 35.5 % — ABNORMAL LOW (ref 36.0–46.0)
HCT: 40.6 % (ref 36.0–46.0)
Hemoglobin: 11.4 g/dL — ABNORMAL LOW (ref 12.0–15.0)
Hemoglobin: 12.4 g/dL (ref 12.0–15.0)
Immature Granulocytes: 0 %
Immature Granulocytes: 0 %
Lymphocytes Relative: 19 %
Lymphocytes Relative: 8 %
Lymphs Abs: 0.7 10*3/uL (ref 0.7–4.0)
Lymphs Abs: 1.7 10*3/uL (ref 0.7–4.0)
MCH: 28.8 pg (ref 26.0–34.0)
MCH: 29.4 pg (ref 26.0–34.0)
MCHC: 30.5 g/dL (ref 30.0–36.0)
MCHC: 32.1 g/dL (ref 30.0–36.0)
MCV: 91.5 fL (ref 80.0–100.0)
MCV: 94.2 fL (ref 80.0–100.0)
Monocytes Absolute: 0.4 10*3/uL (ref 0.1–1.0)
Monocytes Absolute: 0.6 10*3/uL (ref 0.1–1.0)
Monocytes Relative: 5 %
Monocytes Relative: 6 %
Neutro Abs: 6.7 10*3/uL (ref 1.7–7.7)
Neutro Abs: 7.4 10*3/uL (ref 1.7–7.7)
Neutrophils Relative %: 75 %
Neutrophils Relative %: 87 %
Platelets: 238 10*3/uL (ref 150–400)
Platelets: 246 10*3/uL (ref 150–400)
RBC: 3.88 MIL/uL (ref 3.87–5.11)
RBC: 4.31 MIL/uL (ref 3.87–5.11)
RDW: 13.9 % (ref 11.5–15.5)
RDW: 14 % (ref 11.5–15.5)
WBC: 8.5 10*3/uL (ref 4.0–10.5)
WBC: 9.1 10*3/uL (ref 4.0–10.5)
nRBC: 0 % (ref 0.0–0.2)
nRBC: 0 % (ref 0.0–0.2)

## 2020-04-01 LAB — COMPREHENSIVE METABOLIC PANEL
ALT: 14 U/L (ref 0–44)
AST: 20 U/L (ref 15–41)
Albumin: 3.8 g/dL (ref 3.5–5.0)
Alkaline Phosphatase: 54 U/L (ref 38–126)
Anion gap: 10 (ref 5–15)
BUN: 14 mg/dL (ref 6–20)
CO2: 20 mmol/L — ABNORMAL LOW (ref 22–32)
Calcium: 9.3 mg/dL (ref 8.9–10.3)
Chloride: 103 mmol/L (ref 98–111)
Creatinine, Ser: 1.21 mg/dL — ABNORMAL HIGH (ref 0.44–1.00)
GFR, Estimated: 57 mL/min — ABNORMAL LOW (ref >60)
Glucose, Bld: 91 mg/dL (ref 70–99)
Potassium: 3.7 mmol/L (ref 3.5–5.1)
Sodium: 133 mmol/L — ABNORMAL LOW (ref 135–145)
Total Bilirubin: 0.6 mg/dL (ref 0.3–1.2)
Total Protein: 9 g/dL — ABNORMAL HIGH (ref 6.5–8.1)

## 2020-04-01 LAB — BASIC METABOLIC PANEL
Anion gap: 11 (ref 5–15)
BUN: 15 mg/dL (ref 6–20)
CO2: 21 mmol/L — ABNORMAL LOW (ref 22–32)
Calcium: 9.4 mg/dL (ref 8.9–10.3)
Chloride: 105 mmol/L (ref 98–111)
Creatinine, Ser: 1.28 mg/dL — ABNORMAL HIGH (ref 0.44–1.00)
GFR, Estimated: 54 mL/min — ABNORMAL LOW (ref >60)
Glucose, Bld: 127 mg/dL — ABNORMAL HIGH (ref 70–99)
Potassium: 3.7 mmol/L (ref 3.5–5.1)
Sodium: 137 mmol/L (ref 135–145)

## 2020-04-01 LAB — SEDIMENTATION RATE
Sed Rate: 83 mm/hr — ABNORMAL HIGH (ref 0–22)
Sed Rate: 77 mm/hr — ABNORMAL HIGH (ref 0–22)

## 2020-04-01 LAB — I-STAT BETA HCG BLOOD, ED (MC, WL, AP ONLY): I-stat hCG, quantitative: <5 m[IU]/mL (ref <5)

## 2020-04-01 LAB — VITAMIN B12: Vitamin B-12: 550 pg/mL (ref 180–914)

## 2020-04-01 LAB — MAGNESIUM: Magnesium: 1.7 mg/dL (ref 1.7–2.4)

## 2020-04-01 MED ORDER — METHOCARBAMOL 500 MG PO TABS: 500.0000 mg | ORAL_TABLET | Freq: Two times a day (BID) | ORAL | 0 refills | Status: DC

## 2020-04-01 MED ORDER — KETOROLAC TROMETHAMINE 30 MG/ML IJ SOLN
30.0000 mg | Freq: Once | INTRAMUSCULAR | Status: AC
  Administered 2020-04-01: 30 mg via INTRAVENOUS
  Filled 2020-04-01: qty 1

## 2020-04-01 MED ORDER — PROCHLORPERAZINE EDISYLATE 10 MG/2ML IJ SOLN
10.0000 mg | Freq: Once | INTRAMUSCULAR | Status: AC
  Administered 2020-04-01: 10 mg via INTRAVENOUS
  Filled 2020-04-01: qty 2

## 2020-04-01 MED ORDER — DIPHENHYDRAMINE HCL 50 MG/ML IJ SOLN
25.0000 mg | Freq: Once | INTRAMUSCULAR | Status: AC
  Administered 2020-04-01: 25 mg via INTRAVENOUS
  Filled 2020-04-01: qty 1

## 2020-04-01 MED ORDER — KETOROLAC TROMETHAMINE 30 MG/ML IJ SOLN
15.0000 mg | Freq: Once | INTRAMUSCULAR | Status: AC
  Administered 2020-04-01: 15 mg via INTRAVENOUS
  Filled 2020-04-01: qty 1

## 2020-04-01 MED ORDER — HYDROCODONE-ACETAMINOPHEN 5-325 MG PO TABS: 1.0000 | ORAL_TABLET | ORAL | 0 refills | Status: DC | PRN

## 2020-04-01 MED ORDER — METOCLOPRAMIDE HCL 10 MG PO TABS: 10.0000 mg | ORAL_TABLET | Freq: Four times a day (QID) | ORAL | 0 refills | Status: AC | PRN

## 2020-04-01 MED ORDER — SODIUM CHLORIDE 0.9 % IV SOLN: INTRAVENOUS | Status: DC

## 2020-04-01 MED ORDER — SODIUM CHLORIDE 0.9 % IV BOLUS
500.0000 mL | Freq: Once | INTRAVENOUS | Status: AC
  Administered 2020-04-01: 500 mL via INTRAVENOUS

## 2020-04-01 MED ORDER — DOXYCYCLINE HYCLATE 100 MG PO TABS
100.0000 mg | ORAL_TABLET | Freq: Once | ORAL | Status: AC
  Administered 2020-04-01: 100 mg via ORAL
  Filled 2020-04-01: qty 1

## 2020-04-01 MED ORDER — OXYCODONE-ACETAMINOPHEN 5-325 MG PO TABS
1.0000 | ORAL_TABLET | Freq: Once | ORAL | Status: AC
  Administered 2020-04-01: 1 via ORAL
  Filled 2020-04-01: qty 1

## 2020-04-01 MED ORDER — OXYMETAZOLINE HCL 0.05 % NA SOLN
1.0000 | Freq: Once | NASAL | Status: AC
  Administered 2020-04-01: 1 via NASAL
  Filled 2020-04-01: qty 30

## 2020-04-01 MED ORDER — AMOXICILLIN-POT CLAVULANATE 875-125 MG PO TABS: 1.0000 | ORAL_TABLET | Freq: Two times a day (BID) | ORAL | 0 refills | Status: DC

## 2020-04-01 MED ORDER — IOHEXOL 350 MG/ML SOLN
75.0000 mL | Freq: Once | INTRAVENOUS | Status: AC | PRN
  Administered 2020-04-01: 75 mL via INTRAVENOUS

## 2020-04-01 MED ORDER — HYDROCODONE-ACETAMINOPHEN 5-325 MG PO TABS: 1.0000 | ORAL_TABLET | ORAL | 0 refills | Status: AC | PRN

## 2020-04-01 MED ORDER — METHOCARBAMOL 500 MG PO TABS
1000.0000 mg | ORAL_TABLET | Freq: Once | ORAL | Status: AC
  Administered 2020-04-01: 1000 mg via ORAL
  Filled 2020-04-01: qty 2

## 2020-04-01 MED ORDER — FENTANYL CITRATE (PF) 100 MCG/2ML IJ SOLN
100.0000 ug | Freq: Once | INTRAMUSCULAR | Status: AC
  Administered 2020-04-01: 100 ug via INTRAVENOUS
  Filled 2020-04-01: qty 2

## 2020-04-01 NOTE — Discharge Instructions (Signed)
The pain you are having is likely related to the sphenoid sinusitis.  This can be a very painful condition.  We are prescribing a narcotic pain reliever for your discomfort.  Do not drive or work when taking the narcotic pain reliever.  To help the sinuses drain, use Flonase nasal spray 1 in each nostril twice a day for 2 weeks.  You can also use Afrin nasal spray, in each nostril, twice a day, while you are taking the Flonase.  This combination can provide good decongestion, and allow you to improve quicker.  Follow-up with your PCP for checkup next week.

## 2020-04-01 NOTE — ED Triage Notes (Signed)
Patient coming from home, complaint of continued migraine x1 week, seen here yesterday for same. Patient states she did not take her BP medications this morning.

## 2020-04-01 NOTE — Discharge Instructions (Signed)
1. Medications: Amoxicillin for your sinus infection, Robaxin for your neck pain, Reglan for headache, usual home medications 2. Treatment: rest, drink plenty of fluids,  3. Follow Up: Please followup with your primary doctor, rheumatologist and the neurologist for further evaluation of your headaches and elevated sed rate; Please return to the ER for changes in vision, syncope, fevers, worsening headaches or other concerns.

## 2020-04-01 NOTE — ED Provider Notes (Signed)
[facility] EMERGENCY DEPARTMENT Provider Note   CSN: [id number] Arrival date & time: 04/01/20  1226     History Chief Complaint  Patient presents with  . Headache    Christine Todd is a 45 y.o. female.  HPI She presents for headache present for 4 days, unremitting despite evaluations in the ED x2 and multiple medications given.  She states she is taking the prescribed medications without relief.  No history of chronic headaches.  She denies fever, focal weakness or paresthesia.  She does not have a history of migraines, or any other brain/head problems.  She has not been having fevers, rhinorrhea, sore throat, change in hearing, or neck pain.  There are no other known active modifying factors.     Past Medical History:  Diagnosis Date  . Anemia   . Arthritis    "hands and feet" (07/04/2014)  . GERD (gastroesophageal reflux disease)   . Headache    "weekly" (07/04/2014)  . History of blood transfusion 07/04/2014   "related to anemia"  . Lupus ([place])   . Pneumonia "several times"  . 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 SLE (systemic lupus erythematosus) ([place])     Patient Active Problem List   Diagnosis Date Noted  . Symptomatic anemia 07/04/2014  . Benign essential HTN 07/04/2014  . Hypokalemia 07/04/2014  . FOOT PAIN, LEFT 12/10/2009  . KNEE PAIN 11/13/2008  . SKIN RASH 11/13/2008  . UTI 11/09/2008  . Iron deficiency anemia 11/01/2008  . MICROSCOPIC HEMATURIA 11/01/2008  . INSOMNIA 07/31/2008  . HEADACHE 07/31/2008  . ABSCESS, SKIN 04/27/2008  . FREQUENCY, URINARY 03/01/2008  . OVARIAN CYST, RUPTURED 11/08/2007  . GERD 09/24/2007  . SLE 11/25/2006  . CANDIDIASIS 11/18/2006  . SORE THROAT 11/18/2006  . ABNORMAL RESULT, FUNCTION STUDY, LIVER 11/18/2006    Past Surgical History:  Procedure Laterality Date  . DILATION AND CURETTAGE OF UTERUS  2009  . RENAL BIOPSY, PERCUTANEOUS  ?2013     OB History   No obstetric history on file.      Family History  Problem Relation Age of Onset  . Diabetes Other   . Cancer Other   . Hypertension Other   . Diabetes Father     Social History   Tobacco Use  . Smoking status: Never Smoker  . Smokeless tobacco: Never Used  Vaping Use  . Vaping Use: Never used  Substance Use Topics  . Alcohol use: Yes    Comment: occasionally  . Drug use: No    Home Medications Prior to Admission medications   Medication Sig Start Date End Date Taking? Authorizing Provider  HYDROcodone-acetaminophen (NORCO) 5-325 MG tablet Take 1 tablet by mouth every 4 (four) hours as needed for moderate pain. 04/01/20  Yes Daleen Bo, MD  amoxicillin-clavulanate (AUGMENTIN) 875-125 MG tablet Take 1 tablet by mouth 2 (two) times daily. One po bid x 7 days 04/01/20   Muthersbaugh, Jarrett Soho, PA-C  Cholecalciferol (DIALYVITE VITAMIN D 5000 PO) Take 10,000 Units by mouth daily.    [provider]  ferrous sulfate 325 (65 FE) MG tablet Take 1 tablet (325 mg total) by mouth daily. Patient not taking: Reported on 07/16/2019 09/14/18   Caccavale, Sophia, PA-C  hydroxychloroquine (PLAQUENIL) 200 MG tablet Take 400 mg by mouth every evening.  09/28/17   [provider]  lisinopril (PRINIVIL,ZESTRIL) 5 MG tablet Take 5 mg by mouth every evening.  10/26/17   [provider]  metoCLOPramide (REGLAN) 10 MG tablet Take 1 tablet (10 mg total) by mouth every 6 (six) hours as needed for nausea (nausea/headache). 04/01/20   Muthersbaugh, Jarrett Soho, PA-C  mycophenolate (CELLCEPT) 500 MG tablet Take 1,000 mg by mouth 2 (two) times daily. Take 1 capsule every morning and take 2 capsules every evening 04/15/18   [provider]  ondansetron (ZOFRAN ODT) 4 MG disintegrating tablet Take 1 tablet (4 mg total) by mouth every 8 (eight) hours as needed for nausea or vomiting. 07/16/19   Nuala Alpha A, PA-C  POTASSIUM PO Take 1 tablet by mouth daily.    [provider]  predniSONE (DELTASONE) 5 MG  tablet Take 5 mg by mouth every evening.  04/13/18   [provider]  SUMAtriptan (IMITREX) 50 MG tablet Take 1 tablet (50 mg total) by mouth every 2 (two) hours as needed for migraine. May repeat in 2 hours if headache persists or recurs. 03/30/20 04/01/20  Dorie Rank, MD    Allergies    Sulfa antibiotics  Review of Systems   Review of Systems  All other systems reviewed and are negative.   Physical Exam Updated Vital Signs BP 131/79   Pulse 100   Temp 98.1 F (36.7 C) (Oral)   Resp 17   SpO2 100%   Physical Exam Vitals and nursing note reviewed.  Constitutional:      General: She is in acute distress (Uncomfortable).     Appearance: She is well-developed and well-nourished. She is ill-appearing. She is not toxic-appearing or diaphoretic.  HENT:     Head: Normocephalic and atraumatic.     Right Ear: External ear normal.     Left Ear: External ear normal.  Eyes:     Extraocular Movements: EOM normal.     Conjunctiva/sclera: Conjunctivae normal.     Pupils: Pupils are equal, round, and reactive to light.  Neck:     Trachea: Phonation normal.  Cardiovascular:     Rate and Rhythm: Normal rate and regular rhythm.     Heart sounds: Normal heart sounds.  Pulmonary:     Effort: Pulmonary effort is normal.     Breath sounds: Normal breath sounds.  Chest:     Chest wall: No bony tenderness.  Abdominal:     Palpations: Abdomen is soft.     Tenderness: There is no abdominal tenderness.  Musculoskeletal:        General: Normal range of motion.     Cervical back: Normal range of motion and neck supple.  Skin:    General: Skin is warm, dry and intact.  Neurological:     Mental Status: She is alert and oriented to person, place, and time.     Cranial Nerves: No cranial nerve deficit.     Sensory: No sensory deficit.     Motor: No abnormal muscle tone.     Coordination: Coordination normal.     Comments: No dysarthria or aphasia.  Normal grip strength hands,  bilaterally.  Psychiatric:        Mood and Affect: Mood and affect and mood normal.        Behavior: Behavior normal.        Thought Content: Thought content normal.        Judgment: Judgment normal.     ED Results / Procedures / Treatments   Labs (all labs ordered are listed, but only abnormal results are displayed) Labs Reviewed  BASIC METABOLIC PANEL - Abnormal; Notable for the following components:  Result Value   CO2 21 (*)    Glucose, Bld 127 (*)    Creatinine, Ser 1.28 (*)    GFR, Estimated 54 (*)    All other components within normal limits  SEDIMENTATION RATE - Abnormal; Notable for the following components:   Sed Rate 77 (*)    All other components within normal limits  SARS CORONAVIRUS 2 (TAT 6-24 HRS)  CBC WITH DIFFERENTIAL/PLATELET    EKG EKG Interpretation  Date/Time:  Monday April 01 2020 17:24:11 EST Ventricular Rate:  100 PR Interval:    QRS Duration: 96 QT Interval:  354 QTC Calculation: 457 R Axis:   3 Text Interpretation: Sinus tachycardia Consider right atrial enlargement Borderline repolarization abnormality Minimal ST elevation, anterior leads Since last tracing rate faster Otherwise no significant change Confirmed by Daleen Bo [phone]) on 04/01/2020 6:51:48 PM   Radiology CT Angio Head W or Wo Contrast  Result Date: 04/01/2020 CLINICAL DATA:  Dizziness, headache, nausea and vomiting. EXAM: CT ANGIOGRAPHY HEAD AND NECK TECHNIQUE: Multidetector CT imaging of the head and neck was performed using the standard protocol during bolus administration of intravenous contrast. Multiplanar CT image reconstructions and MIPs were obtained to evaluate the vascular anatomy. Carotid stenosis measurements (when applicable) are obtained utilizing NASCET criteria, using the distal internal carotid diameter as the denominator. CONTRAST:  10mL OMNIPAQUE IOHEXOL 350 MG/ML SOLN COMPARISON:  None. FINDINGS: CT HEAD FINDINGS Brain: The brain shows a normal  appearance without evidence of malformation, atrophy, old or acute small or large vessel infarction, mass lesion, hemorrhage, hydrocephalus or extra-axial collection. Vascular: No hyperdense vessel. No evidence of atherosclerotic calcification. Skull: Normal.  No traumatic finding.  No focal bone lesion. Sinuses/Orbits: Maxillary and frontal sinuses are clear. Right ethmoid sinuses are clear. Opacified posterior ethmoid air cells on the left. 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 Mucosal thickening of the small right division of the sphenoid sinus. Other: None significant CTA NECK FINDINGS Aortic arch: Normal Right carotid system: Common carotid artery widely patent to the bifurcation. Carotid bifurcation is normal without soft or calcified plaque. Cervical ICA is normal. Left carotid system: Common carotid artery widely patent to the bifurcation. Carotid bifurcation is normal without soft or calcified plaque. Cervical ICA is normal. Vertebral arteries: Both vertebral artery origins are widely patent. Both vertebral arteries are normal through the cervical region to the foramen magnum. Skeleton: Mild spondylosis C5-6. Other neck: No mass or lymphadenopathy. Upper chest: Early emphysematous change in the upper lobes. No active process. Review of the MIP images confirms the above findings CTA HEAD FINDINGS Anterior circulation: Both internal carotid arteries are patent through the skull base and siphon regions. The anterior and middle cerebral vessels are normal without stenosis, aneurysm or vascular malformation. No vessel occlusion is seen. Posterior circulation: Both vertebral arteries are widely patent through the foramen magnum to the basilar. No basilar stenosis. Posterior circulation branch vessels are normal. Venous sinuses: Patent and normal. Anatomic variants: None significant. Review of the MIP images confirms the above  findings IMPRESSION: Head CT: Normal appearance of the brain itself. Sphenoid sinusitis left worse than right with chronic mucoperiosteal thickening and complete opacification on the left. This could be associated with headache. CT angiography of the neck: Normal. No atherosclerotic disease. No dissection. CT angiography of the head: Normal. No aneurysm, vascular malformation, stenosis or occlusion. Electronically Signed   By: Nelson Chimes M.D.   On: 04/01/2020 01:05   CT Angio  Neck W and/or Wo Contrast  Result Date: 04/01/2020 CLINICAL DATA:  Dizziness, headache, nausea and vomiting. EXAM: CT ANGIOGRAPHY HEAD AND NECK TECHNIQUE: Multidetector CT imaging of the head and neck was performed using the standard protocol during bolus administration of intravenous contrast. Multiplanar CT image reconstructions and MIPs were obtained to evaluate the vascular anatomy. Carotid stenosis measurements (when applicable) are obtained utilizing NASCET criteria, using the distal internal carotid diameter as the denominator. CONTRAST:  27mL OMNIPAQUE IOHEXOL 350 MG/ML SOLN COMPARISON:  None. FINDINGS: CT HEAD FINDINGS Brain: The brain shows a normal appearance without evidence of malformation, atrophy, old or acute small or large vessel infarction, mass lesion, hemorrhage, hydrocephalus or extra-axial collection. Vascular: No hyperdense vessel. No evidence of atherosclerotic calcification. Skull: Normal.  No traumatic finding.  No focal bone lesion. Sinuses/Orbits: Maxillary and frontal sinuses are clear. Right ethmoid sinuses are clear. Opacified posterior ethmoid air cells on the left. 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 Mucosal thickening of the small right division of the sphenoid sinus. Other: None significant CTA NECK FINDINGS Aortic arch: Normal Right carotid system: Common carotid artery widely patent to the bifurcation. Carotid  bifurcation is normal without soft or calcified plaque. Cervical ICA is normal. Left carotid system: Common carotid artery widely patent to the bifurcation. Carotid bifurcation is normal without soft or calcified plaque. Cervical ICA is normal. Vertebral arteries: Both vertebral artery origins are widely patent. Both vertebral arteries are normal through the cervical region to the foramen magnum. Skeleton: Mild spondylosis C5-6. Other neck: No mass or lymphadenopathy. Upper chest: Early emphysematous change in the upper lobes. No active process. Review of the MIP images confirms the above findings CTA HEAD FINDINGS Anterior circulation: Both internal carotid arteries are patent through the skull base and siphon regions. The anterior and middle cerebral vessels are normal without stenosis, aneurysm or vascular malformation. No vessel occlusion is seen. Posterior circulation: Both vertebral arteries are widely patent through the foramen magnum to the basilar. No basilar stenosis. Posterior circulation branch vessels are normal. Venous sinuses: Patent and normal. Anatomic variants: None significant. Review of the MIP images confirms the above findings IMPRESSION: Head CT: Normal appearance of the brain itself. Sphenoid sinusitis left worse than right with chronic mucoperiosteal thickening and complete opacification on the left. This could be associated with headache. CT angiography of the neck: Normal. No atherosclerotic disease. No dissection. CT angiography of the head: Normal. No aneurysm, vascular malformation, stenosis or occlusion. Electronically Signed   By: Nelson Chimes M.D.   On: 04/01/2020 01:05    Procedures Procedures   Medications Ordered in ED Medications  0.9 %  sodium chloride infusion ( Intravenous New Bag/Given 04/01/20 1850)  sodium chloride 0.9 % bolus 500 mL (0 mLs Intravenous Stopped 04/01/20 1850)  ketorolac (TORADOL) 30 MG/ML injection 30 mg (30 mg Intravenous Given 04/01/20 1736)   fentaNYL (SUBLIMAZE) injection 100 mcg (100 mcg Intravenous Given 04/01/20 1736)  oxymetazoline (AFRIN) 0.05 % nasal spray 1 spray (1 spray Each Nare Given 04/01/20 1938)  oxyCODONE-acetaminophen (PERCOCET/ROXICET) 5-325 MG per tablet 1 tablet (1 tablet Oral Given 04/01/20 1937)    ED Course  I have reviewed the triage vital signs and the nursing notes.  Pertinent labs & imaging results that were available during my care of the patient were reviewed by me and considered in my medical decision making (see chart for details).    MDM Rules/Calculators/A&P  Patient Vitals for the past 24 hrs:  BP Temp Temp src Pulse Resp SpO2  04/01/20 1930 131/79 -- -- 100 17 100 %  04/01/20 1900 124/72 -- -- 92 20 98 %  04/01/20 1830 127/77 -- -- 86 14 100 %  04/01/20 1800 121/72 -- -- 81 14 100 %  04/01/20 1730 (!) 158/96 -- -- 98 (!) 21 100 %  04/01/20 1644 (!) 160/98 -- -- 100 14 100 %  04/01/20 1236 (!) 151/99 98.1 F (36.7 C) Oral 94 18 100 %    7:26 PM Reevaluation with update and discussion. After initial assessment and treatment, an updated evaluation reveals she states she is having "just some tinges of headache now."  Findings discussed with the patient and all questions were answered. Daleen Bo   Medical Decision Making:  This patient is presenting for evaluation of headache, ongoing for 4 days, which does require a range of treatment options, and is a complaint that involves a moderate risk of morbidity and mortality. The differential diagnoses include tension headache, migraine headache, pain from sinusitis. I decided to review old records, and in summary middle-aged female, generally healthy presenting with persistent unrelenting headache with diagnosis of sphenoid sinusitis yesterday.  She is not taking potent analgesic medicines at this time..  I did not require additional historical information from anyone.  Clinical Laboratory Tests Ordered, included CBC,  Metabolic panel and Sedimentation rate, Covid screening. Review indicates available tests are reassuring.  CO2 slightly low, glucose mildly elevated, creatinine mildly elevated, GFR low.   Cardiac monitor reviewed-normal sinus rhythm  Critical Interventions-clinical evaluation, laboratory testing, cardiac monitor, analgesic medication parenterally, and orally, reevaluation  After These Interventions, the Patient was reevaluated and was found improved after narcotic analgesia.  Also treated with Toradol.  Patient with sphenoid sinusitis, which likely explains her pain.  There are no other signs or symptoms of concerning intracranial abnormalities.  Patient had improvement of pain, with treatment in the ED.  Will adjust medication regimen, to include narcotic medication, Afrin, Flonase, and recommend that she continue Augmentin.  She will be referred to PCP for follow-up care and treatment.  Will release from work for 3 days.  CRITICAL CARE-no Performed by: Daleen Bo  Nursing Notes Reviewed/ Care Coordinated Applicable Imaging Reviewed Interpretation of Laboratory Data incorporated into ED treatment  The patient appears reasonably screened and/or stabilized for discharge and I doubt any other medical condition or other [facility] requiring further screening, evaluation, or treatment in the ED at this time prior to discharge.  Plan: Home Medications-continue routine medication, okay to stop using Robaxin, and sumatriptan; Home Treatments-rest, fluids, gradual increase activity; return here if the recommended treatment, does not improve the symptoms; Recommended follow up-PCP for checkup next week.     Final Clinical Impression(s) / ED Diagnoses Final diagnoses:  Acute non-recurrent sphenoidal sinusitis    Rx / DC Orders ED Discharge Orders         Ordered    HYDROcodone-acetaminophen (NORCO) 5-325 MG tablet  Every 4 hours PRN,   Status:  Discontinued        04/01/20 1940     HYDROcodone-acetaminophen (NORCO) 5-325 MG tablet  Every 4 hours PRN,   Status:  Discontinued        04/01/20 1941    HYDROcodone-acetaminophen (NORCO) 5-325 MG tablet  Every 4 hours PRN,   Status:  Discontinued        04/01/20 1943    HYDROcodone-acetaminophen (NORCO) 5-325 MG tablet  Every 4 hours  PRN        04/01/20 1943           Daleen Bo, MD 04/01/20 [id number]

## 2020-04-01 NOTE — ED Provider Notes (Signed)
[facility] EMERGENCY DEPARTMENT Provider Note   CSN: [id number] Arrival date & time: 03/31/20  1806     History Chief Complaint  Patient presents with  . Migraine    Christine Todd is a 45 y.o. female with a history of lupus presents to the Emergency Department complaining of gradual, persistent, progressively worsening left-sided headache onset this morning at 11 AM.  She reports she has been having intermittent waxing and waning headaches over the last week.  Normally she controls these with Tylenol however this week as her headaches have increased in frequency and intensity the Tylenol has not been working for her.  She reports that she was seen yesterday in the emergency department with some improvement however her headache never fully resolved.  She reports it came back in intensity around 11 AM this morning.  She reports at that time she also had left-sided neck pain.  She reports the pain is severe, 10/10 and radiates from her left neck up into her left head and behind her left thigh.  She denies vision changes but does report significant photophobia and phonophobia.  She has associated nausea and has had some vomiting today.  Patient has had 2 doses of Tylenol and 2 doses of sumatriptan without any relief in her neck or head pain.  She reports she has never had a headache like this.  It was not sudden onset or thunderclap.  She denies any recent injury including falls, car accidents or manipulation by chiropractor.  She denies numbness, tingling or weakness.  She is not having trouble with balance or speech.  She did vomit once this morning.  No other specific alleviating or aggravating factors.  She denies neck stiffness, fever, chills, sick contacts, abdominal pain.  Reviewed.  Patient was seen yesterday for same.  She was given migraine cocktail with some improvement and discharged home with sumatriptan.   The history is provided by the patient and medical records. No  language interpreter was used.       Past Medical History:  Diagnosis Date  . Anemia   . Arthritis    "hands and feet" (07/04/2014)  . GERD (gastroesophageal reflux disease)   . Headache    "weekly" (07/04/2014)  . History of blood transfusion 07/04/2014   "related to anemia"  . Lupus ([place])   . Pneumonia "several times"  . 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 SLE (systemic lupus erythematosus) ([place])     Patient Active Problem List   Diagnosis Date Noted  . Symptomatic anemia 07/04/2014  . Benign essential HTN 07/04/2014  . Hypokalemia 07/04/2014  . FOOT PAIN, LEFT 12/10/2009  . KNEE PAIN 11/13/2008  . SKIN RASH 11/13/2008  . UTI 11/09/2008  . Iron deficiency anemia 11/01/2008  . MICROSCOPIC HEMATURIA 11/01/2008  . INSOMNIA 07/31/2008  . HEADACHE 07/31/2008  . ABSCESS, SKIN 04/27/2008  . FREQUENCY, URINARY 03/01/2008  . OVARIAN CYST, RUPTURED 11/08/2007  . GERD 09/24/2007  . SLE 11/25/2006  . CANDIDIASIS 11/18/2006  . SORE THROAT 11/18/2006  . ABNORMAL RESULT, FUNCTION STUDY, LIVER 11/18/2006    Past Surgical History:  Procedure Laterality Date  . DILATION AND CURETTAGE OF UTERUS  2009  . RENAL BIOPSY, PERCUTANEOUS  ?2013     OB History   No obstetric history on file.     Family History  Problem Relation Age of Onset  . Diabetes Other   . Cancer Other   . Hypertension Other   .  Diabetes Father     Social History   Tobacco Use  . Smoking status: Never Smoker  . Smokeless tobacco: Never Used  Vaping Use  . Vaping Use: Never used  Substance Use Topics  . Alcohol use: Yes    Comment: occasionally  . Drug use: No    Home Medications Prior to Admission medications   Medication Sig Start Date End Date Taking? Authorizing Provider  amoxicillin-clavulanate (AUGMENTIN) 875-125 MG tablet Take 1 tablet by mouth 2 (two) times daily. One po bid x 7 days 04/01/20  Yes Muthersbaugh, Jarrett Soho, PA-C  methocarbamol (ROBAXIN) 500 MG tablet Take 1 tablet  (500 mg total) by mouth 2 (two) times daily. 04/01/20  Yes Muthersbaugh, Jarrett Soho, PA-C  metoCLOPramide (REGLAN) 10 MG tablet Take 1 tablet (10 mg total) by mouth every 6 (six) hours as needed for nausea (nausea/headache). 04/01/20  Yes Muthersbaugh, Jarrett Soho, PA-C  Cholecalciferol (DIALYVITE VITAMIN D 5000 PO) Take 10,000 Units by mouth daily.    [provider]  ferrous sulfate 325 (65 FE) MG tablet Take 1 tablet (325 mg total) by mouth daily. Patient not taking: Reported on 07/16/2019 09/14/18   Caccavale, Sophia, PA-C  hydroxychloroquine (PLAQUENIL) 200 MG tablet Take 400 mg by mouth every evening.  09/28/17   [provider]  lisinopril (PRINIVIL,ZESTRIL) 5 MG tablet Take 5 mg by mouth every evening.  10/26/17   [provider]  mycophenolate (CELLCEPT) 500 MG tablet Take 1,000 mg by mouth 2 (two) times daily. Take 1 capsule every morning and take 2 capsules every evening 04/15/18   [provider]  ondansetron (ZOFRAN ODT) 4 MG disintegrating tablet Take 1 tablet (4 mg total) by mouth every 8 (eight) hours as needed for nausea or vomiting. 07/16/19   Nuala Alpha A, PA-C  POTASSIUM PO Take 1 tablet by mouth daily.    [provider]  predniSONE (DELTASONE) 5 MG tablet Take 5 mg by mouth every evening.  04/13/18   [provider]  SUMAtriptan (IMITREX) 50 MG tablet Take 1 tablet (50 mg total) by mouth every 2 (two) hours as needed for migraine. May repeat in 2 hours if headache persists or recurs. 03/30/20   Dorie Rank, MD    Allergies    Sulfa antibiotics  Review of Systems   Review of Systems  Constitutional: Negative for appetite change, diaphoresis, fatigue, fever and unexpected weight change.  HENT: Negative for mouth sores.   Eyes: Positive for photophobia. Negative for visual disturbance.  Respiratory: Negative for cough, chest tightness, shortness of breath and wheezing.   Cardiovascular: Negative for chest pain.  Gastrointestinal:  Positive for nausea and vomiting. Negative for abdominal pain, constipation and diarrhea.  Endocrine: Negative for polydipsia, polyphagia and polyuria.  Genitourinary: Negative for dysuria, frequency, hematuria and urgency.  Musculoskeletal: Positive for neck pain. Negative for back pain and neck stiffness.  Skin: Negative for rash.  Allergic/Immunologic: Negative for immunocompromised state.  Neurological: Positive for headaches. Negative for syncope and light-headedness.  Hematological: Does not bruise/bleed easily.  Psychiatric/Behavioral: Negative for sleep disturbance. The patient is not nervous/anxious.     Physical Exam Updated Vital Signs BP (!) 144/99 (BP Location: Right Arm)   Pulse 74   Temp 98 F (36.7 C) (Oral)   Resp 16   SpO2 100%   Physical Exam Vitals and nursing note reviewed.  Constitutional:      General: She is not in acute distress.    Appearance: She is well-developed and well-nourished. She is not diaphoretic.  HENT:     Head: Normocephalic and atraumatic.     Comments: No tenderness over the left temporal artery.  No bulging.  No audible bruit.    Right Ear: Tympanic membrane normal.     Left Ear: Tympanic membrane normal.     Nose: Nose normal.     Mouth/Throat:     Mouth: Oropharynx is clear and moist. Mucous membranes are moist.  Eyes:     General: No scleral icterus.    Extraocular Movements: EOM normal.     Conjunctiva/sclera: Conjunctivae normal.     Pupils: Pupils are equal, round, and reactive to light.     Comments: No horizontal, vertical or rotational nystagmus No proptosis Unable to assess optic disc as patient is uncooperative with funduscopic exam  Neck:      Comments: Full active and passive ROM without pain No midline or paraspinal tenderness No nuchal rigidity or meningeal signs Cardiovascular:     Rate and Rhythm: Normal rate and regular rhythm.     Pulses: Intact distal pulses.  Pulmonary:     Effort: Pulmonary effort is  normal. No respiratory distress.     Breath sounds: Normal breath sounds. No wheezing or rales.  Abdominal:     General: Bowel sounds are normal.     Palpations: Abdomen is soft.     Tenderness: There is no abdominal tenderness. There is no guarding or rebound.  Musculoskeletal:        General: Normal range of motion.     Cervical back: Normal range of motion and neck supple. Muscular tenderness present. No spinous process tenderness. Normal range of motion.  Lymphadenopathy:     Cervical: No cervical adenopathy.  Skin:    General: Skin is warm and dry.     Findings: No rash.  Neurological:     Mental Status: She is alert and oriented to person, place, and time.     Cranial Nerves: No cranial nerve deficit.     Motor: No abnormal muscle tone.     Coordination: Coordination normal.     Comments: Mental Status:  Alert, oriented, thought content appropriate. Speech fluent without evidence of aphasia. Able to follow 2 step commands without difficulty.  Cranial Nerves:  II:  Peripheral visual fields grossly normal, pupils equal, round, reactive to light III,IV, VI: ptosis not present, extra-ocular motions intact bilaterally  V,VII: smile symmetric, facial light touch sensation equal VIII: hearing grossly normal bilaterally  IX,X: midline uvula rise  XI: bilateral shoulder shrug equal and strong XII: midline tongue extension  Motor:  5/5 in upper and lower extremities bilaterally including strong and equal grip strength and dorsiflexion/plantar flexion Sensory: Pinprick and light touch normal in all extremities.  Cerebellar: normal finger-to-nose with bilateral upper extremities Gait: normal gait and balance CV: distal pulses palpable throughout   Psychiatric:        Mood and Affect: Mood and affect normal.        Behavior: Behavior normal.        Thought Content: Thought content normal.        Judgment: Judgment normal.     ED Results / Procedures / Treatments   Labs (all labs  ordered are listed, but only abnormal results are displayed) Labs Reviewed  CBC WITH DIFFERENTIAL/PLATELET - Abnormal; Notable for the following components:      Result Value   Hemoglobin 11.4 (*)    HCT 35.5 (*)    All other components within normal limits  COMPREHENSIVE METABOLIC  PANEL - Abnormal; Notable for the following components:   Sodium 133 (*)    CO2 20 (*)    Creatinine, Ser 1.21 (*)    Total Protein 9.0 (*)    GFR, Estimated 57 (*)    All other components within normal limits  SEDIMENTATION RATE - Abnormal; Notable for the following components:   Sed Rate 83 (*)    All other components within normal limits  MAGNESIUM  VITAMIN B12  URINALYSIS, ROUTINE W REFLEX MICROSCOPIC  I-STAT BETA HCG BLOOD, ED ([facility], [facility], [facility] ONLY)    Radiology CT Angio Head W or Wo Contrast  Result Date: 04/01/2020 CLINICAL DATA:  Dizziness, headache, nausea and vomiting. EXAM: CT ANGIOGRAPHY HEAD AND NECK TECHNIQUE: Multidetector CT imaging of the head and neck was performed using the standard protocol during bolus administration of intravenous contrast. Multiplanar CT image reconstructions and MIPs were obtained to evaluate the vascular anatomy. Carotid stenosis measurements (when applicable) are obtained utilizing NASCET criteria, using the distal internal carotid diameter as the denominator. CONTRAST:  39mL OMNIPAQUE IOHEXOL 350 MG/ML SOLN COMPARISON:  None. FINDINGS: CT HEAD FINDINGS Brain: The brain shows a normal appearance without evidence of malformation, atrophy, old or acute small or large vessel infarction, mass lesion, hemorrhage, hydrocephalus or extra-axial collection. Vascular: No hyperdense vessel. No evidence of atherosclerotic calcification. Skull: Normal.  No traumatic finding.  No focal bone lesion. Sinuses/Orbits: Maxillary and frontal sinuses are clear. Right ethmoid sinuses are clear. Opacified posterior ethmoid air cells on the left. Chronic sinusitis of the left division of the  sphenoid sinus with complete opacification and mucoperiosteal thickening. Mucosal thickening of the small right division of the sphenoid sinus. Other: None significant CTA NECK FINDINGS Aortic arch: Normal Right carotid system: Common carotid artery widely patent to the bifurcation. Carotid bifurcation is normal without soft or calcified plaque. Cervical ICA is normal. Left carotid system: Common carotid artery widely patent to the bifurcation. Carotid bifurcation is normal without soft or calcified plaque. Cervical ICA is normal. Vertebral arteries: Both vertebral artery origins are widely patent. Both vertebral arteries are normal through the cervical region to the foramen magnum. Skeleton: Mild spondylosis C5-6. Other neck: No mass or lymphadenopathy. Upper chest: Early emphysematous change in the upper lobes. No active process. Review of the MIP images confirms the above findings CTA HEAD FINDINGS Anterior circulation: Both internal carotid arteries are patent through the skull base and siphon regions. The anterior and middle cerebral vessels are normal without stenosis, aneurysm or vascular malformation. No vessel occlusion is seen. Posterior circulation: Both vertebral arteries are widely patent through the foramen magnum to the basilar. No basilar stenosis. Posterior circulation branch vessels are normal. Venous sinuses: Patent and normal. Anatomic variants: None significant. Review of the MIP images confirms the above findings IMPRESSION: Head CT: Normal appearance of the brain itself. Sphenoid sinusitis left worse than right with chronic mucoperiosteal thickening and complete opacification on the left. This could be associated with headache. CT angiography of the neck: Normal. No atherosclerotic disease. No dissection. CT angiography of the head: Normal. No aneurysm, vascular malformation, stenosis or occlusion. Electronically Signed   By: Nelson Chimes M.D.   On: 04/01/2020 01:05   CT Angio Neck W and/or  Wo Contrast  Result Date: 04/01/2020 CLINICAL DATA:  Dizziness, headache, nausea and vomiting. EXAM: CT ANGIOGRAPHY HEAD AND NECK TECHNIQUE: Multidetector CT imaging of the head and neck was performed using the standard protocol during bolus administration of intravenous contrast. Multiplanar CT image reconstructions and MIPs  were obtained to evaluate the vascular anatomy. Carotid stenosis measurements (when applicable) are obtained utilizing NASCET criteria, using the distal internal carotid diameter as the denominator. CONTRAST:  25mL OMNIPAQUE IOHEXOL 350 MG/ML SOLN COMPARISON:  None. FINDINGS: CT HEAD FINDINGS Brain: The brain shows a normal appearance without evidence of malformation, atrophy, old or acute small or large vessel infarction, mass lesion, hemorrhage, hydrocephalus or extra-axial collection. Vascular: No hyperdense vessel. No evidence of atherosclerotic calcification. Skull: Normal.  No traumatic finding.  No focal bone lesion. Sinuses/Orbits: Maxillary and frontal sinuses are clear. Right ethmoid sinuses are clear. Opacified posterior ethmoid air cells on the left. 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 Mucosal thickening of the small right division of the sphenoid sinus. Other: None significant CTA NECK FINDINGS Aortic arch: Normal Right carotid system: Common carotid artery widely patent to the bifurcation. Carotid bifurcation is normal without soft or calcified plaque. Cervical ICA is normal. Left carotid system: Common carotid artery widely patent to the bifurcation. Carotid bifurcation is normal without soft or calcified plaque. Cervical ICA is normal. Vertebral arteries: Both vertebral artery origins are widely patent. Both vertebral arteries are normal through the cervical region to the foramen magnum. Skeleton: Mild spondylosis C5-6. Other neck: No mass or lymphadenopathy. Upper chest: Early emphysematous  change in the upper lobes. No active process. Review of the MIP images confirms the above findings CTA HEAD FINDINGS Anterior circulation: Both internal carotid arteries are patent through the skull base and siphon regions. The anterior and middle cerebral vessels are normal without stenosis, aneurysm or vascular malformation. No vessel occlusion is seen. Posterior circulation: Both vertebral arteries are widely patent through the foramen magnum to the basilar. No basilar stenosis. Posterior circulation branch vessels are normal. Venous sinuses: Patent and normal. Anatomic variants: None significant. Review of the MIP images confirms the above findings IMPRESSION: Head CT: Normal appearance of the brain itself. Sphenoid sinusitis left worse than right with chronic mucoperiosteal thickening and complete opacification on the left. This could be associated with headache. CT angiography of the neck: Normal. No atherosclerotic disease. No dissection. CT angiography of the head: Normal. No aneurysm, vascular malformation, stenosis or occlusion. Electronically Signed   By: Nelson Chimes M.D.   On: 04/01/2020 01:05    Procedures Procedures   Medications Ordered in ED Medications  sodium chloride 0.9 % bolus 500 mL (0 mLs Intravenous Stopped 04/01/20 0057)  ondansetron (ZOFRAN) injection 4 mg (4 mg Intravenous Given 03/31/20 2353)  magnesium sulfate IVPB 2 g 50 mL (0 g Intravenous Stopped 04/01/20 0057)  iohexol (OMNIPAQUE) 350 MG/ML injection 75 mL (75 mLs Intravenous Contrast Given 04/01/20 0050)  methocarbamol (ROBAXIN) tablet 1,000 mg (1,000 mg Oral Given 04/01/20 0224)  diphenhydrAMINE (BENADRYL) injection 25 mg (25 mg Intravenous Given 04/01/20 0221)  prochlorperazine (COMPAZINE) injection 10 mg (10 mg Intravenous Given 04/01/20 0224)  ketorolac (TORADOL) 30 MG/ML injection 15 mg (15 mg Intravenous Given 04/01/20 0224)  doxycycline (VIBRA-TABS) tablet 100 mg (100 mg Oral Given 04/01/20 0225)    ED Course  I  have reviewed the triage vital signs and the nursing notes.  Pertinent labs & imaging results that were available during my care of the patient were reviewed by me and considered in my medical decision making (see chart for details).  Clinical Course as of 04/01/20 0501  Mon Apr 01, 2020  0457 Creatinine(!): 1.21 Baseline [HM]  0457 Hemoglobin(!): 11.4 Baseline [HM]  0458 Vitamin B12: 550  Normal limits [HM]    Clinical Course User Index [HM] Muthersbaugh, Gwenlyn Perking   MDM Rules/Calculators/A&P                           Presents with migraine headache.  She does not see a neurologist and has no official diagnosis of migraines.  She was treated yesterday with migraine cocktail with improvement but not complete resolution.  Symptoms are back today and have not improved after sumatriptan.  Given her neck pain, history of lupus and radiation to the left side of the head and behind the left eye concern for possible tubal artery dissection.  Differential also includes temporal arteritis.  Patient does report she has a history of anemia, hypokalemia and recently stopped eating meat.  Will check vitamin B12 levels, magnesium, potassium and other electrolytes.  Labs and imaging pending.  Patient is noted to be somewhat hypertensive.  She has a history of same and has taken her home medications.  Suspect some of this may be due to pain.  Will control pain and monitor blood pressure.  Patient given fluids, Zofran and magnesium.  2:10 AM Labs are reassuring.  Sed rate pending.  CT scan without evidence of vertebral dissection or aneurysm.  Does note likelihood of sinusitis.  Will treat with Augmentin.  Patient continues to have headache after magnesium and Zofran.  Will give Reglan, Benadryl.  Patient was given Decadron yesterday.  4:53 AM Patient reports complete resolution of headache.  Sed rate elevated at 83 today, above her previous elevation of 55.  Reassessed temporal region.  No tenderness  to palpation, no pain with brushing teeth or brushing hair.  Patient does have a history of lupus.  Have her follow closely with her rheumatologist who is at [facility].  Have discussed this with patient and the need for close follow-up.  Patient will follow with [facility] neurology for her headaches.  In the meantime given neck pain and nausea.  We will give methocarbamol and Reglan.  She continues to have prescription for sumatriptan as needed.  Strict return precautions.  Patient states understanding and is in agreement with the plan.  Final Clinical Impression(s) / ED Diagnoses Final diagnoses:  Bad headache  Sphenoid sinusitis, unspecified chronicity  Elevated sed rate    Rx / DC Orders ED Discharge Orders         Ordered    metoCLOPramide (REGLAN) 10 MG tablet  Every 6 hours PRN        04/01/20 0459    amoxicillin-clavulanate (AUGMENTIN) 875-125 MG tablet  2 times daily        04/01/20 0459    methocarbamol (ROBAXIN) 500 MG tablet  2 times daily        04/01/20 0459           Muthersbaugh, Jarrett Soho, PA-C 04/01/20 0501    Palumbo, April, MD 04/01/20 [phone]

## 2020-04-02 ENCOUNTER — Encounter (HOSPITAL_COMMUNITY): Payer: Self-pay | Admitting: Emergency Medicine

## 2020-04-02 ENCOUNTER — Observation Stay (HOSPITAL_COMMUNITY)
Admission: EM | Admit: 2020-04-02 | Discharge: 2020-04-04 | Disposition: A | Discharge status: Home / Self Care | Payer: BC Managed Care – PPO | Attending: Internal Medicine | Admitting: Internal Medicine

## 2020-04-02 DIAGNOSIS — I10 Essential (primary) hypertension: Secondary | ICD-10-CM | POA: Diagnosis not present

## 2020-04-02 DIAGNOSIS — Z20822 Contact with and (suspected) exposure to covid-19: Secondary | ICD-10-CM | POA: Insufficient documentation

## 2020-04-02 DIAGNOSIS — M329 Systemic lupus erythematosus, unspecified: Secondary | ICD-10-CM | POA: Diagnosis present

## 2020-04-02 DIAGNOSIS — Z79899 Other long term (current) drug therapy: Secondary | ICD-10-CM | POA: Insufficient documentation

## 2020-04-02 DIAGNOSIS — G43001 Migraine without aura, not intractable, with status migrainosus: Secondary | ICD-10-CM | POA: Diagnosis not present

## 2020-04-02 DIAGNOSIS — R519 Headache, unspecified: Secondary | ICD-10-CM | POA: Diagnosis present

## 2020-04-02 DIAGNOSIS — G43901 Migraine, unspecified, not intractable, with status migrainosus: Secondary | ICD-10-CM | POA: Diagnosis present

## 2020-04-02 LAB — URINALYSIS, ROUTINE W REFLEX MICROSCOPIC
Bilirubin Urine: NEGATIVE
Glucose, UA: NEGATIVE mg/dL
Ketones, ur: 20 mg/dL — AB
Leukocytes,Ua: NEGATIVE
Nitrite: NEGATIVE
Protein, ur: 100 mg/dL — AB
Specific Gravity, Urine: 1.016 (ref 1.005–1.030)
pH: 5 (ref 5.0–8.0)

## 2020-04-02 LAB — CBC
HCT: 34.4 % — ABNORMAL LOW (ref 36.0–46.0)
Hemoglobin: 10.9 g/dL — ABNORMAL LOW (ref 12.0–15.0)
MCH: 29.2 pg (ref 26.0–34.0)
MCHC: 31.7 g/dL (ref 30.0–36.0)
MCV: 92.2 fL (ref 80.0–100.0)
Platelets: 241 10*3/uL (ref 150–400)
RBC: 3.73 MIL/uL — ABNORMAL LOW (ref 3.87–5.11)
RDW: 13.6 % (ref 11.5–15.5)
WBC: 7.4 10*3/uL (ref 4.0–10.5)
nRBC: 0 % (ref 0.0–0.2)

## 2020-04-02 LAB — COMPREHENSIVE METABOLIC PANEL
ALT: 12 U/L (ref 0–44)
AST: 14 U/L — ABNORMAL LOW (ref 15–41)
Albumin: 3.4 g/dL — ABNORMAL LOW (ref 3.5–5.0)
Alkaline Phosphatase: 49 U/L (ref 38–126)
Anion gap: 8 (ref 5–15)
BUN: 10 mg/dL (ref 6–20)
CO2: 23 mmol/L (ref 22–32)
Calcium: 9.3 mg/dL (ref 8.9–10.3)
Chloride: 104 mmol/L (ref 98–111)
Creatinine, Ser: 1.04 mg/dL — ABNORMAL HIGH (ref 0.44–1.00)
GFR, Estimated: >60 mL/min (ref >60)
Glucose, Bld: 95 mg/dL (ref 70–99)
Potassium: 3.7 mmol/L (ref 3.5–5.1)
Sodium: 135 mmol/L (ref 135–145)
Total Bilirubin: 0.6 mg/dL (ref 0.3–1.2)
Total Protein: 8.3 g/dL — ABNORMAL HIGH (ref 6.5–8.1)

## 2020-04-02 LAB — I-STAT BETA HCG BLOOD, ED (MC, WL, AP ONLY): I-stat hCG, quantitative: <5 m[IU]/mL (ref <5)

## 2020-04-02 LAB — LIPASE, BLOOD: Lipase: 30 U/L (ref 11–51)

## 2020-04-02 LAB — SARS CORONAVIRUS 2 (TAT 6-24 HRS): SARS Coronavirus 2: NEGATIVE

## 2020-04-02 MED ORDER — ONDANSETRON 4 MG PO TBDP
4.0000 mg | ORAL_TABLET | Freq: Once | ORAL | Status: AC | PRN
  Administered 2020-04-02: 4 mg via ORAL
  Filled 2020-04-02: qty 1

## 2020-04-02 NOTE — ED Triage Notes (Addendum)
Pt reports been vomiting today and cannot keep nothing down, decrease appetite, weakness and chest discomfort for a few days Pt reports "funny feeling" in chest like a burning sensation.  Pt states also want to make sure she is not dehydrated, Pt reports drank 7 bottles of water and still feels thirsty. Pt also reports sinus infection and states cannot take medicine due to emesis which is causing an severe migraine

## 2020-04-03 ENCOUNTER — Emergency Department (HOSPITAL_COMMUNITY): Payer: BC Managed Care – PPO

## 2020-04-03 ENCOUNTER — Encounter (HOSPITAL_COMMUNITY): Payer: Self-pay | Admitting: Internal Medicine

## 2020-04-03 DIAGNOSIS — G43901 Migraine, unspecified, not intractable, with status migrainosus: Secondary | ICD-10-CM

## 2020-04-03 DIAGNOSIS — G43001 Migraine without aura, not intractable, with status migrainosus: Secondary | ICD-10-CM | POA: Diagnosis not present

## 2020-04-03 LAB — SEDIMENTATION RATE: Sed Rate: 117 mm/hr — ABNORMAL HIGH (ref 0–22)

## 2020-04-03 LAB — RESP PANEL BY RT-PCR (FLU A&B, COVID) ARPGX2
Influenza A by PCR: NEGATIVE
Influenza B by PCR: NEGATIVE
SARS Coronavirus 2 by RT PCR: NEGATIVE

## 2020-04-03 LAB — PROTIME-INR
INR: 1.1 (ref 0.8–1.2)
Prothrombin Time: 14 seconds (ref 11.4–15.2)

## 2020-04-03 LAB — APTT: aPTT: 32 seconds (ref 24–36)

## 2020-04-03 MED ORDER — DIHYDROERGOTAMINE MESYLATE 1 MG/ML IJ SOLN
1.0000 mg | Freq: Three times a day (TID) | INTRAMUSCULAR | Status: AC
  Administered 2020-04-03 – 2020-04-04 (×2): 1 mg via INTRAVENOUS
  Filled 2020-04-03 (×3): qty 1

## 2020-04-03 MED ORDER — DOCUSATE SODIUM 100 MG PO CAPS
100.0000 mg | ORAL_CAPSULE | Freq: Two times a day (BID) | ORAL | Status: DC
  Administered 2020-04-03 – 2020-04-04 (×3): 100 mg via ORAL
  Filled 2020-04-03 (×3): qty 1

## 2020-04-03 MED ORDER — DEXAMETHASONE SODIUM PHOSPHATE 10 MG/ML IJ SOLN
10.0000 mg | Freq: Once | INTRAMUSCULAR | Status: AC
  Administered 2020-04-03: 10 mg via INTRAVENOUS
  Filled 2020-04-03: qty 1

## 2020-04-03 MED ORDER — ACETAMINOPHEN 325 MG PO TABS
650.0000 mg | ORAL_TABLET | Freq: Four times a day (QID) | ORAL | Status: DC | PRN
  Administered 2020-04-03: 650 mg via ORAL
  Filled 2020-04-03: qty 2

## 2020-04-03 MED ORDER — PROCHLORPERAZINE EDISYLATE 10 MG/2ML IJ SOLN
10.0000 mg | Freq: Once | INTRAMUSCULAR | Status: AC
  Administered 2020-04-03: 10 mg via INTRAVENOUS
  Filled 2020-04-03: qty 2

## 2020-04-03 MED ORDER — GADOBUTROL 1 MMOL/ML IV SOLN
8.0000 mL | Freq: Once | INTRAVENOUS | Status: AC | PRN
  Administered 2020-04-03: 8 mL via INTRAVENOUS

## 2020-04-03 MED ORDER — DIHYDROERGOTAMINE MESYLATE 1 MG/ML IJ SOLN
0.5000 mg | Freq: Once | INTRAMUSCULAR | Status: AC
  Administered 2020-04-03: 0.5 mg via INTRAVENOUS
  Filled 2020-04-03: qty 0.5

## 2020-04-03 MED ORDER — METOCLOPRAMIDE HCL 5 MG PO TABS: 10.0000 mg | ORAL_TABLET | Freq: Four times a day (QID) | ORAL | Status: DC | PRN

## 2020-04-03 MED ORDER — LIP MEDEX EX OINT
TOPICAL_OINTMENT | CUTANEOUS | Status: DC | PRN
  Filled 2020-04-03: qty 7

## 2020-04-03 MED ORDER — METOCLOPRAMIDE HCL 5 MG/ML IJ SOLN
10.0000 mg | Freq: Three times a day (TID) | INTRAMUSCULAR | Status: AC
  Administered 2020-04-03 – 2020-04-04 (×2): 10 mg via INTRAVENOUS
  Filled 2020-04-03 (×2): qty 2

## 2020-04-03 MED ORDER — HYDROXYCHLOROQUINE SULFATE 200 MG PO TABS
400.0000 mg | ORAL_TABLET | Freq: Every day | ORAL | Status: DC
  Administered 2020-04-03 – 2020-04-04 (×2): 400 mg via ORAL
  Filled 2020-04-03 (×2): qty 2

## 2020-04-03 MED ORDER — HYDRALAZINE HCL 20 MG/ML IJ SOLN: 5.0000 mg | INTRAMUSCULAR | Status: DC | PRN

## 2020-04-03 MED ORDER — HYDROCODONE-ACETAMINOPHEN 5-325 MG PO TABS
1.0000 | ORAL_TABLET | ORAL | Status: DC | PRN
  Administered 2020-04-04: 2 via ORAL
  Filled 2020-04-03: qty 2

## 2020-04-03 MED ORDER — FENTANYL CITRATE (PF) 100 MCG/2ML IJ SOLN
100.0000 ug | Freq: Once | INTRAMUSCULAR | Status: AC
  Administered 2020-04-03: 100 ug via INTRAVENOUS
  Filled 2020-04-03: qty 2

## 2020-04-03 MED ORDER — LISINOPRIL 20 MG PO TABS
20.0000 mg | ORAL_TABLET | Freq: Every day | ORAL | Status: DC
Start: 2020-04-03 — End: 2020-04-04
  Administered 2020-04-03 – 2020-04-04 (×2): 20 mg via ORAL
  Filled 2020-04-03 (×2): qty 1

## 2020-04-03 MED ORDER — ONDANSETRON HCL 4 MG PO TABS: 4.0000 mg | ORAL_TABLET | Freq: Four times a day (QID) | ORAL | Status: DC | PRN

## 2020-04-03 MED ORDER — POLYETHYLENE GLYCOL 3350 17 G PO PACK: 17.0000 g | PACK | Freq: Every day | ORAL | Status: DC | PRN

## 2020-04-03 MED ORDER — NORETHINDRONE ACETATE 5 MG PO TABS
5.0000 mg | ORAL_TABLET | Freq: Every day | ORAL | Status: DC
  Administered 2020-04-03 – 2020-04-04 (×2): 5 mg via ORAL
  Filled 2020-04-03 (×2): qty 1

## 2020-04-03 MED ORDER — SODIUM CHLORIDE 0.9% FLUSH
3.0000 mL | Freq: Two times a day (BID) | INTRAVENOUS | Status: DC
  Administered 2020-04-03 (×2): 3 mL via INTRAVENOUS

## 2020-04-03 MED ORDER — METOCLOPRAMIDE HCL 5 MG/ML IJ SOLN
10.0000 mg | Freq: Once | INTRAMUSCULAR | Status: AC
  Administered 2020-04-03: 10 mg via INTRAVENOUS
  Filled 2020-04-03: qty 2

## 2020-04-03 MED ORDER — DEXAMETHASONE SODIUM PHOSPHATE 10 MG/ML IJ SOLN
10.0000 mg | INTRAMUSCULAR | Status: DC
  Administered 2020-04-04: 10 mg via INTRAVENOUS
  Filled 2020-04-03 (×2): qty 1

## 2020-04-03 MED ORDER — DIPHENHYDRAMINE HCL 50 MG/ML IJ SOLN
25.0000 mg | Freq: Once | INTRAMUSCULAR | Status: AC
  Administered 2020-04-03: 25 mg via INTRAVENOUS
  Filled 2020-04-03: qty 1

## 2020-04-03 MED ORDER — ONDANSETRON HCL 4 MG/2ML IJ SOLN: 4.0000 mg | Freq: Four times a day (QID) | INTRAMUSCULAR | Status: DC | PRN

## 2020-04-03 MED ORDER — AMLODIPINE BESYLATE 10 MG PO TABS
10.0000 mg | ORAL_TABLET | Freq: Every day | ORAL | Status: DC
  Administered 2020-04-03 – 2020-04-04 (×2): 10 mg via ORAL
  Filled 2020-04-03: qty 1
  Filled 2020-04-03: qty 2

## 2020-04-03 MED ORDER — MAGNESIUM SULFATE 2 GM/50ML IV SOLN
2.0000 g | Freq: Once | INTRAVENOUS | Status: AC
  Administered 2020-04-03: 2 g via INTRAVENOUS
  Filled 2020-04-03: qty 50

## 2020-04-03 MED ORDER — MYCOPHENOLATE SODIUM 180 MG PO TBEC
720.0000 mg | DELAYED_RELEASE_TABLET | Freq: Two times a day (BID) | ORAL | Status: DC
  Administered 2020-04-03 – 2020-04-04 (×3): 720 mg via ORAL
  Filled 2020-04-03 (×5): qty 4

## 2020-04-03 MED ORDER — LACTATED RINGERS IV SOLN: INTRAVENOUS | Status: DC

## 2020-04-03 MED ORDER — MORPHINE SULFATE (PF) 2 MG/ML IV SOLN
2.0000 mg | INTRAVENOUS | Status: DC | PRN
Start: 2020-04-03 — End: 2020-04-04

## 2020-04-03 MED ORDER — ACETAMINOPHEN 650 MG RE SUPP: 650.0000 mg | Freq: Four times a day (QID) | RECTAL | Status: DC | PRN

## 2020-04-03 MED ORDER — BISACODYL 5 MG PO TBEC: 5.0000 mg | DELAYED_RELEASE_TABLET | Freq: Every day | ORAL | Status: DC | PRN

## 2020-04-03 MED ORDER — AMOXICILLIN-POT CLAVULANATE 875-125 MG PO TABS
1.0000 | ORAL_TABLET | Freq: Two times a day (BID) | ORAL | Status: DC
  Administered 2020-04-03 – 2020-04-04 (×3): 1 via ORAL
  Filled 2020-04-03 (×5): qty 1

## 2020-04-03 NOTE — ED Notes (Signed)
Patient transported to MRI 

## 2020-04-03 NOTE — ED Notes (Signed)
Lunch Ordered @ 1000. °

## 2020-04-03 NOTE — H&P (Signed)
History and Physical    Christine Todd:[id number] DOB: [birth year] DOA: 04/02/2020  PCP: Medicine, [facility]:  [place] - rheumatology; [place] - nephrology; [place] - hematology Patient coming from:  Home - lives alone; NOK: Joneen Caraway, Elenor Legato, [phone]   Chief Complaint: HA  HPI: Christine Todd is a 45 y.o. female with medical history significant of SLE with lupus nephritis presenting with refractory headache with n/v.  She reports headaches since Tuesday, getting worse.  She came to the ER on Saturday and said it was a migraine and gave her medication without improvement.  She came back and had a CT and diagnosed a sinus infection and gave Amoxil, Robaxin, and something else but that also didn't work.  She came back the next day and they were concerned about a "deep sinus infection in the middle of the brain" and told her to stop taking migraine medication and gave her percocet.  After d/c, it started coming back again and the pharmacy was closed so she had to wait until the next AM to get the meds.  She couldn't keep anything down.  She hasn't had an appetite or eaten since Saturday.  Better when she doesn't move at all.  Worse with movement.  She has never had anything similar.    She was diagnosed with SLE at age 76y and it has "been like a roller coaster."  She takes daily prednisone, Cellcept -> Myfortic, Plaquenil, MVI.  She has lupus nephritis.  Also on norethindrone to help control menses - started in late January.  She was initially seen in the ER on 22/6.  She was given a migraine cocktail (Compazine, Benadryl, Decadron, Toradol) and had improvement and so was discharged with Imitrex.  She returned on 2/27 and was given NS; Zofran; Mag; Robaxin; Benadryl; Compazine; and Toradol.  CT negative for dissection or aneurysm but appeared to show sinusitis and so she was given Doxy.  She reported complete resolution of the headache but it recurred that afternoon and  she returned to the ER.  She was given IVF; Toradol; Fentanyl; Afrin; and Percocet and discharged.        ED Course:  New-onset migraine with status migrainosus.  H/o SLE.  4th ER visit.   Spoke with neurology.  MRI/MRV negative.  Neuro recommends Mag++, Decadron, Compazine.  Review of Systems: As per HPI; otherwise review of systems reviewed and negative.   Ambulatory Status:  Ambulates without assistance  COVID Vaccine Status:  None  Past Medical History:  Diagnosis Date  . Anemia   . Arthritis    "hands and feet" (07/04/2014)  . GERD (gastroesophageal reflux disease)   . Headache    "weekly" (07/04/2014)  . History of blood transfusion 07/04/2014   "related to anemia"  . Pneumonia "several times"  . 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 SLE (systemic lupus erythematosus) ([place])     Past Surgical History:  Procedure Laterality Date  . DILATION AND CURETTAGE OF UTERUS  2009  . RENAL BIOPSY, PERCUTANEOUS  ?2013    Social History   Socioeconomic History  . Marital status: Single    Spouse name: Not on file  . Number of children: Not on file  . Years of education: Not on file  . Highest education level: Not on file  Occupational History  . Occupation: Scientist, water quality  Tobacco Use  . Smoking status: Never Smoker  . Smokeless tobacco: Never Used  Vaping Use  .  Vaping Use: Never used  Substance and Sexual Activity  . Alcohol use: Yes    Comment: occasionally  . Drug use: No  . Sexual activity: Yes    Birth control/protection: None  Other Topics Concern  . Not on file  Social History Narrative  . Not on file   Social Determinants of Health   Financial Resource Strain: Not on file  Food Insecurity: Not on file  Transportation Needs: Not on file  Physical Activity: Not on file  Stress: Not on file  Social Connections: Not on file  Intimate Partner Violence: Not on file    Allergies  Allergen Reactions  . Sulfa Antibiotics Itching and Rash    Family  History  Problem Relation Age of Onset  . Diabetes Other   . Cancer Other   . Hypertension Other   . Diabetes Father     Prior to Admission medications   Medication Sig Start Date End Date Taking? Authorizing Provider  amoxicillin-clavulanate (AUGMENTIN) 875-125 MG tablet Take 1 tablet by mouth 2 (two) times daily. One po bid x 7 days 04/01/20   Muthersbaugh, Jarrett Soho, PA-C  Cholecalciferol (DIALYVITE VITAMIN D 5000 PO) Take 10,000 Units by mouth daily.    [provider]  ferrous sulfate 325 (65 FE) MG tablet Take 1 tablet (325 mg total) by mouth daily. Patient not taking: Reported on 07/16/2019 09/14/18   Caccavale, Sophia, PA-C  HYDROcodone-acetaminophen (NORCO) 5-325 MG tablet Take 1 tablet by mouth every 4 (four) hours as needed for moderate pain. 04/01/20   Daleen Bo, MD  hydroxychloroquine (PLAQUENIL) 200 MG tablet Take 400 mg by mouth every evening.  09/28/17   [provider]  lisinopril (PRINIVIL,ZESTRIL) 5 MG tablet Take 5 mg by mouth every evening.  10/26/17   [provider]  metoCLOPramide (REGLAN) 10 MG tablet Take 1 tablet (10 mg total) by mouth every 6 (six) hours as needed for nausea (nausea/headache). 04/01/20   Muthersbaugh, Jarrett Soho, PA-C  mycophenolate (CELLCEPT) 500 MG tablet Take 1,000 mg by mouth 2 (two) times daily. Take 1 capsule every morning and take 2 capsules every evening 04/15/18   [provider]  ondansetron (ZOFRAN ODT) 4 MG disintegrating tablet Take 1 tablet (4 mg total) by mouth every 8 (eight) hours as needed for nausea or vomiting. 07/16/19   Nuala Alpha A, PA-C  POTASSIUM PO Take 1 tablet by mouth daily.    [provider]  predniSONE (DELTASONE) 5 MG tablet Take 5 mg by mouth every evening.  04/13/18   [provider]  SUMAtriptan (IMITREX) 50 MG tablet Take 1 tablet (50 mg total) by mouth every 2 (two) hours as needed for migraine. May repeat in 2 hours if headache persists or recurs. 03/30/20 04/01/20   Dorie Rank, MD    Physical Exam: Vitals:   04/03/20 0630 04/03/20 0700 04/03/20 0745 04/03/20 1115  BP: (!) 150/95 (!) 143/98 (!) 147/97 (!) 137/94  Pulse: 88 90 88 87  Resp: _0 Temp:      TempSrc:      SpO2: 100% 100% 100% 100%  Weight:      Height:         . General:  Appears calm and is in NAD, c/o persistent headache . Eyes:  PERRL, EOMI, normal lids, iris . ENT:  grossly normal hearing, lips & tongue, mmm; appropriate dentition . Neck:  no LAD, masses or thyromegaly . Cardiovascular:  RRR, no m/r/g. No LE edema.  Marland Kitchen Respiratory:  CTA bilaterally with no wheezes/rales/rhonchi.  Normal respiratory effort. . Abdomen:  soft, NT, ND, NABS . Skin:  no rash or induration seen on limited exam . Musculoskeletal:  grossly normal tone BUE/BLE, good ROM, no bony abnormality . Psychiatric: blunted mood and affect, speech fluent and appropriate, AOx3 . Neurologic:  CN 2-12 grossly intact, moves all extremities in coordinated fashion    Radiological Exams on Admission: Independently reviewed - see discussion in A/P where applicable  MR BRAIN WO CONTRAST  Result Date: 04/03/2020 CLINICAL DATA:  Headache EXAM: MRI HEAD WITHOUT CONTRAST TECHNIQUE: Multiplanar, multiecho pulse sequences of the brain and surrounding structures were obtained without intravenous contrast. COMPARISON:  04/01/2020 CTA FINDINGS: Brain: No acute infarction, hemorrhage, hydrocephalus, extra-axial collection or mass lesion. No white matter disease or brain atrophy. No evidence of intracranial spread of infection. Vascular: The left superior ophthalmic vein is brighter than the right on FLAIR images but is normally enhancing on postcontrast imaging, as is the cavernous sinuses. Skull and upper cervical spine: Normal marrow signal Sinuses/Orbits: Mucosal thickening in the ethmoid and sphenoid sinuses with complete opacification of the left sphenoid sinus which shows sclerotic wall thickening and possible  expansion. The left sphenoid ostium is widened by CT. IMPRESSION: 1. Normal appearance of the brain. 2. Active sinusitis with complete opacification of the left sphenoid sinus which is obstructed at the sphenoid ethmoidal recess. Electronically Signed   By: Monte Fantasia M.D.   On: 04/03/2020 05:31   MR MRV HEAD W WO CONTRAST  Result Date: 04/03/2020 CLINICAL DATA:  Headache, rule out dural venous sinus thrombosis EXAM: MR VENOGRAM HEAD WITHOUT AND WITH CONTRAST TECHNIQUE: Angiographic images of the intracranial venous structures were obtained using MRV technique without and with intravenous contrast. CONTRAST:  12m GADAVIST GADOBUTROL 1 MMOL/ML IV SOLN COMPARISON:  04/01/2020 CTA FINDINGS: Widely patent dural venous sinuses with dominant right-sided transverse sigmoid outflow. Paired deep veins are also unremarkable. On time-of-flight the left superior ophthalmic vein is less well seen but is symmetrically enhancing on the post-contrast MRV. Symmetric and normal appearing cavernous sinus enhancement without bulging. IMPRESSION: Negative intracranial MRV. Electronically Signed   By: JMonte FantasiaM.D.   On: 04/03/2020 05:26    EKG: Independently reviewed.  Sinus tachycardia with rate 100; nonspecific ST changes with no evidence of acute ischemia   Labs on Admission: I have personally reviewed the available labs and imaging studies at the time of the admission.  Pertinent labs:   Unremarkable CMP WBC 7.4 Hgb 10.9 ESR 117 INR 1.1 COVID/flu negative UA: moderate Hgb, 20 ketones, 100 protein   Assessment/Plan Principal Problem:   Status migrainosus Active Problems:   Lupus (systemic lupus erythematosus) (HCC)   Benign essential HTN   Status migrainosus -Patient presenting with apparent refractory migraine - this is her 4th ER visit since 2/26 -No new neurologic symptoms -She does have a long-standing h/o SLE -Her PE is unremarkable including neurologically -MRI was  negative -Overall low suspicion for serious condition -Will observe overnight -Did not appear to respond to ER treatments including Toradol, Ativan, Magnesium, Decadron, and Reglan -Neurology consulted and has recommended dexamethasone and magnesium -Anticipate d/c to home tomorrow  SLE -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 TGraylin Shiverat WCobb Plaquenil -Hold prednisone in lieu of Decadron  HTN -Continue Norvasc,  Lisinopril    DVT prophylaxis: SCDs Code Status:  Full - confirmed with patient Family Communication: None present Disposition Plan:  Home once clinically improved Consults called: Neurology  Admission status: It is my clinical opinion that referral for OBSERVATION is reasonable and necessary in this patient based on the above information provided. The aforementioned taken together are felt to place the patient at high risk for further clinical deterioration. However it is anticipated that the patient may be medically stable for discharge from the hospital within 24 to 48 hours.    Karmen Bongo MD [facility]   How to contact the [facility] Attending or Consulting provider [place] or covering provider during after hours [place], for this patient?  1. Check the care team in [facility] and look for a) attending/consulting [facility] provider listed and b) the [facility] team listed 2. Log into [email or web address] and use [place]'s universal password to access. If you do not have the password, please contact the hospital operator. 3. Locate the [facility] provider you are looking for under [facility] and page to a number that you can be directly reached. 4. If you still have difficulty reaching the provider, please page the [facility] (Director on Call) for the Hospitalists listed on amion for  assistance.   04/03/2020, 12:04 PM

## 2020-04-03 NOTE — ED Notes (Signed)
Pt ambulated to the restroom.

## 2020-04-03 NOTE — Consult Note (Signed)
Neurology Consultation  Reason for Consult: Headache Referring Physician: Dr. Ophelia Charter  CC: Progressive headache for 8 days  History is obtained from: Chart review, patient  HPI: Christine Todd is a 45 y.o. female with a medical history significant for headaches, anemia, systemic lupus erythematosus, and arthritis who presents to the ED for evaluation of an 8 day progressive fontal headache. She states that the headache began last Tuesday, February 22 around 13:00 while at work. She took some acetaminophen and rested and the headache subsided. This occurred again in the same pattern on Wednesday and Thursday. On Friday, she states the headache began earlier while at work and she took acetaminophen without relief. The headache from Friday progressed in severity and became constant. Saturday, she noted the headache woke her up and she had associated nausea, decreased appetite, and endorses some photophobia and phonophobia. She states she has not been able to hold food down or her medications due to loss of appetite and nausea since Friday 2/25. She states she was seen in the ED multiple times over the last week and was given Robaxin, migraine medications, percocet and amoxicillin for a sinus infection without relief. She denies vision changes with her headache, tenderness to palpation of her scalp, or fevers at home. Currently, her headache is frontal and left temporal rated 7/10 in severity with a sharp and throbbing quality but she endorses some improvement in her nausea today with antiemetic medications.  She does state she has a history of headaches with her last prolonged headache over 20 years ago. Her normal headaches are random occurring every few months and last 2 - 3 hours with improvement with acetaminophen and rest. Approximately one month ago, she was prescribed OCPs for management of menorrhagia with reports of irregular menses for the month of February.  ROS: A 14 point ROS was performed and is  negative except as noted in the HPI.   Past Medical History:  Diagnosis Date  . Anemia   . Arthritis    "hands and feet" (07/04/2014)  . GERD (gastroesophageal reflux disease)   . Headache    "weekly" (07/04/2014)  . History of blood transfusion 07/04/2014   "related to anemia"  . Pneumonia "several times"  . 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 SLE (systemic lupus erythematosus) (HCC)    Family History  Problem Relation Age of Onset  . Diabetes Other   . Cancer Other   . Hypertension Other   . Diabetes Father    Social History:   reports that she has never smoked. She has never used smokeless tobacco. She reports current alcohol use. She reports that she does not use drugs.  Medications No current facility-administered medications for this encounter.  Current Outpatient Medications:  .  amoxicillin-clavulanate (AUGMENTIN) 875-125 MG tablet, Take 1 tablet by mouth 2 (two) times daily. One po bid x 7 days, Disp: 14 tablet, Rfl: 0 .  Cholecalciferol (DIALYVITE VITAMIN D 5000 PO), Take 10,000 Units by mouth daily., Disp: , Rfl:  .  ferrous sulfate 325 (65 FE) MG tablet, Take 1 tablet (325 mg total) by mouth daily. (Patient not taking: Reported on 07/16/2019), Disp: 30 tablet, Rfl: 0 .  HYDROcodone-acetaminophen (NORCO) 5-325 MG tablet, Take 1 tablet by mouth every 4 (four) hours as needed for moderate pain., Disp: 20 tablet, Rfl: 0 .  hydroxychloroquine (PLAQUENIL) 200 MG tablet, Take 400 mg by mouth every evening. , Disp: , Rfl:  .  lisinopril (PRINIVIL,ZESTRIL) 5  MG tablet, Take 5 mg by mouth every evening. , Disp: , Rfl:  .  metoCLOPramide (REGLAN) 10 MG tablet, Take 1 tablet (10 mg total) by mouth every 6 (six) hours as needed for nausea (nausea/headache)., Disp: 6 tablet, Rfl: 0 .  mycophenolate (CELLCEPT) 500 MG tablet, Take 1,000 mg by mouth 2 (two) times daily. Take 1 capsule every morning and take 2 capsules every evening, Disp: , Rfl:  .  ondansetron (ZOFRAN ODT) 4 MG  disintegrating tablet, Take 1 tablet (4 mg total) by mouth every 8 (eight) hours as needed for nausea or vomiting., Disp: 9 tablet, Rfl: 0 .  POTASSIUM PO, Take 1 tablet by mouth daily., Disp: , Rfl:  .  predniSONE (DELTASONE) 5 MG tablet, Take 5 mg by mouth every evening. , Disp: , Rfl:   Exam: Current vital signs: BP (!) 147/97   Pulse 88   Temp 99.4 F (37.4 C) (Rectal)   Resp 11   Ht 5\' 7"  (1.702 m)   Wt 76 kg   SpO2 100%   BMI 26.24 kg/m  Vital signs in last 24 hours: Temp:  [97.8 F (36.6 C)-99.4 F (37.4 C)] 99.4 F (37.4 C) (03/02 0450) Pulse Rate:  [83-96] 88 (03/02 0745) Resp:  [2-20] 11 (03/02 0745) BP: (143-175)/(94-104) 147/97 (03/02 0745) SpO2:  [96 %-100 %] 100 % (03/02 0745) Weight:  [76 kg] 76 kg (03/01 2041)  GENERAL: Awake, in a dark room, in discomfort from headache.  Head: Normocephalic and atraumatic without obvious deformity EENT: Dry mucous membranes, no OP obstruction LUNGS - Normal respiratory effort, non-labored breathing CV - Extremities warm and without edema ABDOMEN - Soft, nontender Ext: warm, well perfused  NEURO:  Mental Status: alert and oriented to person, place, time, and situation. She is able to give a clear and coherent history. Speech is without dysarthria or aphasia. Comprehension is intact. Occasionally doses off in the middle of conversation and wakes to voice. Cranial Nerves:  II: PERRL. Visual fields full. Exam somewhat limited due to photophobia.  III, IV, VI: EOMI. Lid elevation symmetric and full.  V: Sensation is intact to light touch and symmetrical to face.   VII: Face is symmetric resting and smiling.   VIII: Hearing intact to voice IX, X: Phonation normal.  XI: Normal sternocleidomastoid and trapezius muscle strength XII: Tongue protrudes midline. Motor: 5/5 strength is all muscle groups.  Tone is normal. Bulk is normal.  Sensation- Intact to light touch bilaterally in all four extremities. Extinction intact. 2 point  discrimination.  Coordination: FTN intact bilaterally. HKS intact bilaterally. No pronator drift.   DTRs: 2+ throughout.  Gait- deferred  Labs I have reviewed labs in epic and the results pertinent to this consultation are: CBC    Component Value Date/Time   WBC 7.4 04/02/2020 2100   RBC 3.73 (L) 04/02/2020 2100   HGB 10.9 (L) 04/02/2020 2100   HCT 34.4 (L) 04/02/2020 2100   PLT 241 04/02/2020 2100   MCV 92.2 04/02/2020 2100   MCH 29.2 04/02/2020 2100   MCHC 31.7 04/02/2020 2100   RDW 13.6 04/02/2020 2100   LYMPHSABS 0.7 04/01/2020 1656   MONOABS 0.4 04/01/2020 1656   EOSABS 0.0 04/01/2020 1656   BASOSABS 0.0 04/01/2020 1656   CMP     Component Value Date/Time   NA 135 04/02/2020 2100   K 3.7 04/02/2020 2100   CL 104 04/02/2020 2100   CO2 23 04/02/2020 2100   GLUCOSE 95 04/02/2020 2100   BUN  10 04/02/2020 2100   CREATININE 1.04 (H) 04/02/2020 2100   CALCIUM 9.3 04/02/2020 2100   PROT 8.3 (H) 04/02/2020 2100   ALBUMIN 3.4 (L) 04/02/2020 2100   AST 14 (L) 04/02/2020 2100   ALT 12 04/02/2020 2100   ALKPHOS 49 04/02/2020 2100   BILITOT 0.6 04/02/2020 2100   GFRNONAA >60 04/02/2020 2100   GFRAA 53 (L) 07/16/2019 0909   Lab Results  Component Value Date   ESRSEDRATE 117 (H) 04/03/2020   Imaging I have reviewed the images obtained: 2/28 Head CT: Normal appearance of the brain itself. Sphenoid sinusitis left worse than right with chronic mucoperiosteal thickening and complete opacification on the left. This could be associated with Headache.  CT angiography of the neck: Normal. No atherosclerotic disease. Nodissection.  CT angiography of the head: Normal. No aneurysm, vascular malformation, stenosis or occlusion.  MRI examination of the brain 1. Normal appearance of the brain. 2. Active sinusitis with complete opacification of the left sphenoid sinus which is obstructed at the sphenoid ethmoidal recess.  MRV head: Negative intracranial MRV.  Assessment:  45 year old female with history of lupus who presents with 8 days of progressive headache with some associated nausea, vomiting, photophobia, and phonophobia.  - CT without acute intracranial abnormality but with evidence of active sinusitis on amoxicillin without improvement and concerns about keeping oral medication down due to nausea and vomiting. - MRI with further demonstration of active sinusitis with negative intracranial MRV - Labs significant for hemoglobin of 7.4, elevated ESR at 117.  - Examination reveals patient in obvious discomfort from left frontotemporal region headache, 7/10 in severity, sharp and throbbing in quality. - Presentation felt to be most consistent with migraine headache presentation. Low suspicion for CNS infection at this time. Small vessel vasculitis possible but felt less likely with current presentation.    Pt seen by NP/Neuro and later by MD. Note/plan to be edited by MD as needed.  Anibal Henderson, AGAC-NP [facility] Pager: [phone]  I seen the patient reviewed the above note.  She does have unilateral photophobic headache which I think is most consistent with migraine.  Given her negative imaging and no other concerning findings, I would treated as such for now.  I would a trial of 0.5 mg of DHE, and if she tolerates it then increase her dose to 1 mg for two additional doses.  Recommendations: -DHE, raskin protocol x 3 doses -Neurology to continue to follow  Roland Rack, MD [facility] [phone]  If 7pm- 7am, please page neurology on call as listed in [place].

## 2020-04-03 NOTE — ED Provider Notes (Signed)
[facility] Provider Note   CSN: [id number] Arrival date & time: 04/02/20  1911     History Chief Complaint  Patient presents with  . Emesis  . Fatigue    Christine Todd is a 45 y.o. female.  The history is provided by the patient.  Headache Pain location:  L parietal Quality:  Dull Radiates to: Forehead. Onset quality:  Gradual Duration:  1 week Timing:  Constant Progression:  Worsening Chronicity:  New Similar to prior headaches: no   Relieved by: Oral medications. Worsened by:  Nothing Associated symptoms: abdominal pain, cough, fatigue and neck pain   Associated symptoms: no blurred vision, no ear pain, no fever, no focal weakness, no hearing loss, no neck stiffness, no syncope and no weakness   Patient with history of lupus, anemia presents with headache.  This is the patient's fourth ER visit in the past week.  Patient reports gradual onset headache at least a week ago.  She had reported previous history of migraines but now reports that she has never felt this type of headache before.  No fevers.  She reports nausea and vomiting.  She reports generalized weakness but no focal weakness.  No slurred speech or difficulty swallowing No recent head trauma.  No travel.  Denies history of COVID-19 illness.  She was never vaccinated for Covid Denies previous history of stroke She reports mild epigastric pain but none at this time.  Denies any active chest pain She was recently diagnosed with a sinus infection but is unable to take the medications    Past Medical History:  Diagnosis Date  . Anemia   . Arthritis    "hands and feet" (07/04/2014)  . GERD (gastroesophageal reflux disease)   . Headache    "weekly" (07/04/2014)  . History of blood transfusion 07/04/2014   "related to anemia"  . Lupus ([place])   . Pneumonia "several times"  . 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 SLE (systemic lupus erythematosus) ([place])     Patient  Active Problem List   Diagnosis Date Noted  . Symptomatic anemia 07/04/2014  . Benign essential HTN 07/04/2014  . Hypokalemia 07/04/2014  . FOOT PAIN, LEFT 12/10/2009  . KNEE PAIN 11/13/2008  . SKIN RASH 11/13/2008  . UTI 11/09/2008  . Iron deficiency anemia 11/01/2008  . MICROSCOPIC HEMATURIA 11/01/2008  . INSOMNIA 07/31/2008  . HEADACHE 07/31/2008  . ABSCESS, SKIN 04/27/2008  . FREQUENCY, URINARY 03/01/2008  . OVARIAN CYST, RUPTURED 11/08/2007  . GERD 09/24/2007  . SLE 11/25/2006  . CANDIDIASIS 11/18/2006  . SORE THROAT 11/18/2006  . ABNORMAL RESULT, FUNCTION STUDY, LIVER 11/18/2006    Past Surgical History:  Procedure Laterality Date  . DILATION AND CURETTAGE OF UTERUS  2009  . RENAL BIOPSY, PERCUTANEOUS  ?2013     OB History   No obstetric history on file.     Family History  Problem Relation Age of Onset  . Diabetes Other   . Cancer Other   . Hypertension Other   . Diabetes Father     Social History   Tobacco Use  . Smoking status: Never Smoker  . Smokeless tobacco: Never Used  Vaping Use  . Vaping Use: Never used  Substance Use Topics  . Alcohol use: Yes    Comment: occasionally  . Drug use: No    Home Medications Prior to Admission medications   Medication Sig Start Date End Date Taking? Authorizing Provider  amoxicillin-clavulanate (AUGMENTIN) 875-125 MG tablet Take 1 tablet by mouth 2 (two) times daily. One po bid x 7 days 04/01/20   Muthersbaugh, Jarrett Soho, PA-C  Cholecalciferol (DIALYVITE VITAMIN D 5000 PO) Take 10,000 Units by mouth daily.    [provider]  ferrous sulfate 325 (65 FE) MG tablet Take 1 tablet (325 mg total) by mouth daily. Patient not taking: Reported on 07/16/2019 09/14/18   Caccavale, Sophia, PA-C  HYDROcodone-acetaminophen (NORCO) 5-325 MG tablet Take 1 tablet by mouth every 4 (four) hours as needed for moderate pain. 04/01/20   Daleen Bo, MD  hydroxychloroquine (PLAQUENIL) 200 MG tablet Take 400 mg by mouth every  evening.  09/28/17   [provider]  lisinopril (PRINIVIL,ZESTRIL) 5 MG tablet Take 5 mg by mouth every evening.  10/26/17   [provider]  metoCLOPramide (REGLAN) 10 MG tablet Take 1 tablet (10 mg total) by mouth every 6 (six) hours as needed for nausea (nausea/headache). 04/01/20   Muthersbaugh, Jarrett Soho, PA-C  mycophenolate (CELLCEPT) 500 MG tablet Take 1,000 mg by mouth 2 (two) times daily. Take 1 capsule every morning and take 2 capsules every evening 04/15/18   [provider]  ondansetron (ZOFRAN ODT) 4 MG disintegrating tablet Take 1 tablet (4 mg total) by mouth every 8 (eight) hours as needed for nausea or vomiting. 07/16/19   Nuala Alpha A, PA-C  POTASSIUM PO Take 1 tablet by mouth daily.    [provider]  predniSONE (DELTASONE) 5 MG tablet Take 5 mg by mouth every evening.  04/13/18   [provider]  SUMAtriptan (IMITREX) 50 MG tablet Take 1 tablet (50 mg total) by mouth every 2 (two) hours as needed for migraine. May repeat in 2 hours if headache persists or recurs. 03/30/20 04/01/20  Dorie Rank, MD    Allergies    Sulfa antibiotics  Review of Systems   Review of Systems  Constitutional: Positive for appetite change and fatigue. Negative for fever.  HENT: Negative for ear pain, hearing loss and trouble swallowing.   Eyes: Negative for blurred vision and visual disturbance.  Respiratory: Positive for cough.   Cardiovascular: Negative for chest pain and syncope.  Gastrointestinal: Positive for abdominal pain.  Musculoskeletal: Positive for neck pain. Negative for neck stiffness.  Skin: Negative for rash.  Neurological: Positive for headaches. Negative for focal weakness, syncope, speech difficulty and weakness.  All other systems reviewed and are negative.   Physical Exam Updated Vital Signs BP (!) 175/104 (BP Location: Right Arm)   Pulse 88   Temp 98.5 F (36.9 C) (Oral)   Resp 20   Ht 1.702 m (5\' 7" )   Wt 76 kg   SpO2 100%    BMI 26.24 kg/m   Physical Exam CONSTITUTIONAL: Ill-appearing HEAD: Normocephalic/atraumatic EYES: EOMI/PERRL, no nystagmus, no ptosis ENMT: Mucous membranes moist NECK: supple, no bruits SPINE/BACK:entire spine nontender CV: S1/S2 noted, no murmurs/rubs/gallops noted LUNGS: Lungs are clear to auscultation bilaterally, no apparent distress ABDOMEN: soft, nontender, no rebound or guarding GU:no cva tenderness NEURO:Awake/alert, face symmetric, no arm or leg drift is noted Equal 5/5 strength with shoulder abduction, elbow flex/extension, wrist flex/extension in upper extremities and equal hand grips bilaterally Equal 5/5 strength with hip flexion,knee flex/extension, foot dorsi/plantar flexion Cranial nerves 3/4/5/6/08/10/08/11/12 tested and intact No past pointing Sensation to light touch intact in all extremities EXTREMITIES: pulses normal, full ROM SKIN: warm, color normal PSYCH: no abnormalities of mood noted, alert and oriented to situation   ED Results / Procedures /  Treatments   Labs (all labs ordered are listed, but only abnormal results are displayed) Labs Reviewed  COMPREHENSIVE METABOLIC PANEL - Abnormal; Notable for the following components:      Result Value   Creatinine, Ser 1.04 (*)    Total Protein 8.3 (*)    Albumin 3.4 (*)    AST 14 (*)    All other components within normal limits  CBC - Abnormal; Notable for the following components:   RBC 3.73 (*)    Hemoglobin 10.9 (*)    HCT 34.4 (*)    All other components within normal limits  URINALYSIS, ROUTINE W REFLEX MICROSCOPIC - Abnormal; Notable for the following components:   Hgb urine dipstick MODERATE (*)    Ketones, ur 20 (*)    Protein, ur 100 (*)    Bacteria, UA RARE (*)    All other components within normal limits  RESP PANEL BY RT-PCR (FLU A&B, COVID) ARPGX2  LIPASE, BLOOD  SEDIMENTATION RATE  PROTIME-INR  APTT  I-STAT BETA HCG BLOOD, ED ([facility], [facility], [facility] ONLY)    EKG EKG  Interpretation  Date/Time:  Wednesday April 03 2020 05:33:39 EST Ventricular Rate:  85 PR Interval:    QRS Duration: 102 QT Interval:  356 QTC Calculation: 424 R Axis:   -18 Text Interpretation: Sinus rhythm Short PR interval Consider right atrial enlargement Borderline left axis deviation No significant change since last tracing Confirmed by Ripley Fraise [phone]) on 04/03/2020 5:43:08 AM   Radiology MR BRAIN WO CONTRAST  Result Date: 04/03/2020 CLINICAL DATA:  Headache EXAM: MRI HEAD WITHOUT CONTRAST TECHNIQUE: Multiplanar, multiecho pulse sequences of the brain and surrounding structures were obtained without intravenous contrast. COMPARISON:  04/01/2020 CTA FINDINGS: Brain: No acute infarction, hemorrhage, hydrocephalus, extra-axial collection or mass lesion. No white matter disease or brain atrophy. No evidence of intracranial spread of infection. Vascular: The left superior ophthalmic vein is brighter than the right on FLAIR images but is normally enhancing on postcontrast imaging, as is the cavernous sinuses. Skull and upper cervical spine: Normal marrow signal Sinuses/Orbits: Mucosal thickening in the ethmoid and sphenoid sinuses with complete opacification of the left sphenoid sinus which shows sclerotic wall thickening and possible expansion. The left sphenoid ostium is widened by CT. IMPRESSION: 1. Normal appearance of the brain. 2. Active sinusitis with complete opacification of the left sphenoid sinus which is obstructed at the sphenoid ethmoidal recess. Electronically Signed   By: Monte Fantasia M.D.   On: 04/03/2020 05:31   MR MRV HEAD W WO CONTRAST  Result Date: 04/03/2020 CLINICAL DATA:  Headache, rule out dural venous sinus thrombosis EXAM: MR VENOGRAM HEAD WITHOUT AND WITH CONTRAST TECHNIQUE: Angiographic images of the intracranial venous structures were obtained using MRV technique without and with intravenous contrast. CONTRAST:  56mL GADAVIST GADOBUTROL 1 MMOL/ML IV SOLN  COMPARISON:  04/01/2020 CTA FINDINGS: Widely patent dural venous sinuses with dominant right-sided transverse sigmoid outflow. Paired deep veins are also unremarkable. On time-of-flight the left superior ophthalmic vein is less well seen but is symmetrically enhancing on the post-contrast MRV. Symmetric and normal appearing cavernous sinus enhancement without bulging. IMPRESSION: Negative intracranial MRV. Electronically Signed   By: Monte Fantasia M.D.   On: 04/03/2020 05:26    Procedures Procedures   Medications Ordered in ED Medications  ondansetron (ZOFRAN-ODT) disintegrating tablet 4 mg (4 mg Oral Given 04/02/20 2050)  prochlorperazine (COMPAZINE) injection 10 mg (10 mg Intravenous Given 04/03/20 0349)  diphenhydrAMINE (BENADRYL) injection 25 mg (25 mg Intravenous Given 04/03/20  0353)  gadobutrol (GADAVIST) 1 MMOL/ML injection 8 mL (8 mLs Intravenous Contrast Given 04/03/20 0448)  fentaNYL (SUBLIMAZE) injection 100 mcg (100 mcg Intravenous Given 04/03/20 0535)  magnesium sulfate IVPB 2 g 50 mL (0 g Intravenous Stopped 04/03/20 0659)  dexamethasone (DECADRON) injection 10 mg (10 mg Intravenous Given 04/03/20 1937)    ED Course  I have reviewed the triage vital signs and the nursing notes.  Pertinent labs & imaging results that were available during my care of the patient were reviewed by me and considered in my medical decision making (see chart for details).    MDM Rules/Calculators/A&P                          3:13 AM Patient with history of lupus presents with ongoing headache for at least a week. Patient has had multiple recent ED visits that included CT imaging of her head and neck.  She is also diagnosed with a sinus infection & has been prescribed antibiotic.  Patient did have elevated sed rates, but this was felt likely due to her underlying lupus and less likely temporal arteritis.  Patient has no visual changes Due to persistent headache, nausea vomiting patient will require further  work-up.  She is currently afebrile and now leukocytosis, therefore infectious etiology is less likely.  However given history of lupus cerebral venous sinus thrombosis is a possibility. We will proceed with MRI/MRV head 0630AM MRI/MRV are negative.  Patient reports continued left parietal headache.  Patient is afebrile without obvious meningeal signs, will defer lumbar puncture Discussed the case with on-call neurology Dr. Theda Sers.  He reports that migraines can present later in life in lupus patients.  He recommends dexamethasone and magnesium. Since patient is having persistent symptoms this is her fourth ER visit for the same, patient be admitted for likely status migrainosus Plan to admit to the hospital for further treatment. 7:27 AM Discussed with Dr. Lorin Mercy with hospitalist for admission Final Clinical Impression(s) / ED Diagnoses Final diagnoses:  Status migrainosus    Rx / DC Orders ED Discharge Orders    None       Ripley Fraise, MD 04/03/20 [phone]

## 2020-04-04 DIAGNOSIS — G43901 Migraine, unspecified, not intractable, with status migrainosus: Secondary | ICD-10-CM | POA: Diagnosis not present

## 2020-04-04 DIAGNOSIS — G43001 Migraine without aura, not intractable, with status migrainosus: Secondary | ICD-10-CM | POA: Diagnosis not present

## 2020-04-04 LAB — CBC
HCT: 35.8 % — ABNORMAL LOW (ref 36.0–46.0)
Hemoglobin: 11.5 g/dL — ABNORMAL LOW (ref 12.0–15.0)
MCH: 28.6 pg (ref 26.0–34.0)
MCHC: 32.1 g/dL (ref 30.0–36.0)
MCV: 89.1 fL (ref 80.0–100.0)
Platelets: 248 10*3/uL (ref 150–400)
RBC: 4.02 MIL/uL (ref 3.87–5.11)
RDW: 13.4 % (ref 11.5–15.5)
WBC: 8.8 10*3/uL (ref 4.0–10.5)
nRBC: 0 % (ref 0.0–0.2)

## 2020-04-04 LAB — BASIC METABOLIC PANEL
Anion gap: 10 (ref 5–15)
BUN: 21 mg/dL — ABNORMAL HIGH (ref 6–20)
CO2: 21 mmol/L — ABNORMAL LOW (ref 22–32)
Calcium: 9.3 mg/dL (ref 8.9–10.3)
Chloride: 103 mmol/L (ref 98–111)
Creatinine, Ser: 1.1 mg/dL — ABNORMAL HIGH (ref 0.44–1.00)
GFR, Estimated: >60 mL/min (ref >60)
Glucose, Bld: 114 mg/dL — ABNORMAL HIGH (ref 70–99)
Potassium: 4 mmol/L (ref 3.5–5.1)
Sodium: 134 mmol/L — ABNORMAL LOW (ref 135–145)

## 2020-04-04 LAB — HIV ANTIBODY (ROUTINE TESTING W REFLEX): HIV Screen 4th Generation wRfx: NONREACTIVE

## 2020-04-04 MED ORDER — GABAPENTIN 300 MG PO CAPS: 300.0000 mg | ORAL_CAPSULE | Freq: Three times a day (TID) | ORAL | 0 refills | Status: AC

## 2020-04-04 MED ORDER — SUMATRIPTAN SUCCINATE 100 MG PO TABS: 100.0000 mg | ORAL_TABLET | Freq: Once | ORAL | 0 refills | Status: AC | PRN

## 2020-04-04 MED ORDER — VALPROATE SODIUM 100 MG/ML IV SOLN
500.0000 mg | Freq: Once | INTRAVENOUS | Status: AC
  Administered 2020-04-04: 500 mg via INTRAVENOUS
  Filled 2020-04-04: qty 5

## 2020-04-04 MED ORDER — MAGNESIUM SULFATE 2 GM/50ML IV SOLN
2.0000 g | Freq: Once | INTRAVENOUS | Status: AC
  Administered 2020-04-04: 2 g via INTRAVENOUS
  Filled 2020-04-04: qty 50

## 2020-04-04 NOTE — Discharge Instructions (Signed)

## 2020-04-04 NOTE — Progress Notes (Signed)
Nsg Discharge Note  Admit Date:  04/02/2020 Discharge date: 04/04/2020   [name] to be D/C'd Home per MD order.  AVS completed.  Copy for chart, and copy for patient signed, and dated. Patient/caregiver able to verbalize understanding.  Discharge Medication: Allergies as of 04/04/2020      Reactions   Sulfa Antibiotics Itching, Rash      Medication List    TAKE these medications   amLODipine 10 MG tablet Commonly known as: NORVASC Take 10 mg by mouth daily.   amoxicillin-clavulanate 875-125 MG tablet Commonly known as: Augmentin Take 1 tablet by mouth 2 (two) times daily. One po bid x 7 days What changed: additional instructions   DIALYVITE VITAMIN D 5000 PO Take 10,000 Units by mouth daily.   gabapentin 300 MG capsule Commonly known as: Neurontin Take 1 capsule (300 mg total) by mouth 3 (three) times daily for 3 days.   HYDROcodone-acetaminophen 5-325 MG tablet Commonly known as: Norco Take 1 tablet by mouth every 4 (four) hours as needed for moderate pain.   hydroxychloroquine 200 MG tablet Commonly known as: PLAQUENIL Take 400 mg by mouth daily.   ibuprofen 200 MG tablet Commonly known as: ADVIL Take 200 mg by mouth as needed (pain).   lisinopril 20 MG tablet Commonly known as: ZESTRIL Take 20 mg by mouth daily.   metoCLOPramide 10 MG tablet Commonly known as: Reglan Take 1 tablet (10 mg total) by mouth every 6 (six) hours as needed for nausea (nausea/headache).   MULTIVITAMIN ADULT PO Take 1 tablet by mouth daily.   mycophenolate 360 MG Tbec EC tablet Commonly known as: MYFORTIC Take 720 mg by mouth 2 (two) times daily.   norethindrone 5 MG tablet Commonly known as: AYGESTIN Take 5-10 mg by mouth daily.   potassium chloride SA 20 MEQ tablet Commonly known as: KLOR-CON Take 20 mEq by mouth as needed (leg cramps).   predniSONE 5 MG tablet Commonly known as: DELTASONE Take 2.5 mg by mouth every morning.   SUMAtriptan 100 MG tablet Commonly  known as: IMITREX Take 1 tablet (100 mg total) by mouth once as needed for up to 10 days for migraine (No more than twice per week.). May repeat in 2 hours if headache persists or recurs.   zinc gluconate 50 MG tablet Take 50 mg by mouth daily.       Discharge Assessment: Vitals:   04/04/20 0950 04/04/20 1201  BP: (!) 145/83 129/84  Pulse: 67 81  Resp:  17  Temp:  98.5 F (36.9 C)  SpO2:  100%   Skin clean, dry and intact without evidence of skin break down, no evidence of skin tears noted. IV catheter discontinued intact. Site without signs and symptoms of complications - no redness or edema noted at insertion site, patient denies c/o pain - only slight tenderness at site.  Dressing with slight pressure applied.  D/c Instructions-Education: Discharge instructions given to patient/family with verbalized understanding. D/c education completed with patient/family including follow up instructions, medication list, d/c activities limitations if indicated, with other d/c instructions as indicated by MD - patient able to verbalize understanding, all questions fully answered. Patient instructed to return to ED, call 911, or call MD for any changes in condition.  Patient escorted via [place], and D/C home via private auto.  Erasmo Leventhal, RN 04/04/2020 2:29 PM

## 2020-04-04 NOTE — Discharge Summary (Addendum)
Discharge Summary  Christine Todd DUK:[id number] DOB: 1976/01/03  PCP: [facility]  Admit date: 04/02/2020 Discharge date: 04/04/2020  Time spent: 35 minutes.  Recommendations for Outpatient Follow-up:  1. Follow-up with your rheumatologist at [facility] in 1 to 2 weeks. 2. Follow-up with neurology in 1 to 2 weeks. 3. Follow-up with your primary care provider in 1 to 2 weeks. 4. Take your medications as prescribed.  Discharge Diagnoses:  Active Hospital Problems   Diagnosis Date Noted  . Status migrainosus 04/03/2020  . Benign essential HTN 07/04/2014  . Lupus (systemic lupus erythematosus) ([place]) 11/25/2006    Resolved Hospital Problems  No resolved problems to display.    Discharge Condition: Stable.  Diet recommendation: Resume previous diet.  Vitals:   04/04/20 0950 04/04/20 1201  BP: (!) 145/83 129/84  Pulse: 67 81  Resp:  17  Temp:  98.5 F (36.9 C)  SpO2:  100%    History of present illness:  Christine Todd is a 45 y.o. female with medical history significant of SLE with lupus nephritis presenting with refractory headache with nausea and vomiting.  She reports headaches since Tuesday, getting worse.  She came to the ER on Saturday and said it was a migraine and gave her medication without improvement.  She came back and had a CT and diagnosed a sinus infection and gave Amoxil, Robaxin, and something else but that also didn't work.  She came back the next day and they were concerned about a "deep sinus infection in the middle of the brain" and told her to stop taking migraine medication and gave her percocet.  After d/c, it started coming back again and the pharmacy was closed so she had to wait until the next AM to get the meds.  She couldn't keep anything down.  She hasn't had an appetite or eaten since Saturday.  Better when she doesn't move at all.  Worse with movement.  She has never had anything similar.    She was diagnosed with SLE at age  35y and it has "been like a roller coaster."  She takes daily prednisone, Cellcept -> Myfortic, Plaquenil, MVI.  She has lupus nephritis.  Also on norethindrone to help control menses - started in late January.  She was initially seen in the ER on 22/6.  She was given a migraine cocktail (Compazine, Benadryl, Decadron, Toradol) and had improvement and so was discharged with Imitrex.  She returned on 2/27 and was given NS; Zofran; Mag; Robaxin; Benadryl; Compazine; and Toradol.  CT negative for dissection or aneurysm but appeared to show sinusitis and so she was given Doxy.  She reported complete resolution of the headache but it recurred that afternoon and she returned to the ER.  She was given IVF; Toradol; Fentanyl; Afrin; and Percocet and discharged.     Work-up revealed new onset migraine with status migrainosus.  MRI brain done on 04/03/2020 showed normal appearance of the brain, active sinusitis with complete opacification of the left sphenoid sinus which is obstructed at the sphenoid ethmoidal recess.    She was seen by neurology with recommendation for a dose of IV Depakote/IV magnesium.  Discharged with gabapentin 300 mg 3 times daily x3 days, Imitrex 100 mg at onset of severe headache, would not use more than twice per week.  If she continues to have problems with headaches could consider starting an abortive agent as an outpatient.  04/04/20: Patient was seen and examined at her bedside.  She states  she feels better, her headache has greatly improved.  She has no new complaints at the time of this visit.  Seen by neurology, okay to discharge from a neurological standpoint.  Vital signs and labs have been reviewed and are stable.   Hospital Course:  Principal Problem:   Status migrainosus Active Problems:   Lupus (systemic lupus erythematosus) (H[facility])   Benign essential HTN  Status migrainosus -Patient presenting with apparent refractory migraine - this is her 4th ER visit since 03/30/20 -No  new neurologic symptoms -She does have a long-standing h/o SLE -Her PE is unremarkable including neurologically -MRI brain was unremarkable with normal appearance of the brain. -Neurology consulted and made recommendations as stated above. -Follow-up with neurology outpatient in 1 to 2 weeks.  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 Graylin Shiver at [facility], Plaquenil -Resume home regimen. -Follow-up with your rheumatologist in 1 to 2 weeks.  HTN BP stable. -Continue your home regimen  Follow-up with your PCP in 1 to 2 weeks  Sinusitis Resume your home regimen Finish your course of antibiotics Follow up with your PCP in 1-2 weeks.     Code Status:  Full - confirmed with patient     Procedures:  None.  Consultations:  Neurology.  Discharge Exam: BP 129/84 (BP Location: Left Arm)   Pulse 81   Temp 98.5 F (36.9 C) (Oral)   Resp 17   Ht 5\' 7"  (1.702 m)   Wt 73.9 kg   SpO2 100%   BMI 25.53 kg/m  . General: 45 y.o. year-old female well developed well nourished in no acute distress.  Somnolent but easily arousable to voices.  Oriented x3. . Cardiovascular: Regular rate and rhythm with no rubs or gallops.  No thyromegaly or JVD noted.   Marland Kitchen Respiratory: Clear to auscultation with no wheezes or rales. Good inspiratory effort. . Abdomen: Soft nontender nondistended with normal bowel sounds x4 quadrants. . Musculoskeletal: No lower extremity edema bilaterally. Marland Kitchen Psychiatry: Mood is appropriate for condition and setting  Discharge Instructions You were cared for by a hospitalist during your hospital stay. If you have any questions about your discharge medications or the care you received while you were in the hospital after you are discharged, you can call the unit  and asked to speak with the hospitalist on call if the hospitalist that took care of you is not available. Once you are discharged, your primary care physician will handle any further medical issues. Please note that NO REFILLS for any discharge medications will be authorized once you are discharged, as it is imperative that you return to your primary care physician (or establish a relationship with a primary care physician if you do not have one) for your aftercare needs so that they can reassess your need for medications and monitor your lab values.   Allergies as of 04/04/2020      Reactions   Sulfa Antibiotics Itching, Rash      Medication List    TAKE these medications   amLODipine 10 MG tablet Commonly known as: NORVASC Take 10 mg by mouth daily.   amoxicillin-clavulanate 875-125 MG tablet Commonly known as: Augmentin Take 1 tablet by mouth 2 (two) times daily. One po bid x 7 days What changed: additional instructions   DIALYVITE VITAMIN D  5000 PO Take 10,000 Units by mouth daily.   gabapentin 300 MG capsule Commonly known as: Neurontin Take 1 capsule (300 mg total) by mouth 3 (three) times daily for 3 days.   HYDROcodone-acetaminophen 5-325 MG tablet Commonly known as: Norco Take 1 tablet by mouth every 4 (four) hours as needed for moderate pain.   hydroxychloroquine 200 MG tablet Commonly known as: PLAQUENIL Take 400 mg by mouth daily.   ibuprofen 200 MG tablet Commonly known as: ADVIL Take 200 mg by mouth as needed (pain).   lisinopril 20 MG tablet Commonly known as: ZESTRIL Take 20 mg by mouth daily.   metoCLOPramide 10 MG tablet Commonly known as: Reglan Take 1 tablet (10 mg total) by mouth every 6 (six) hours as needed for nausea (nausea/headache).   MULTIVITAMIN ADULT PO Take 1 tablet by mouth daily.   mycophenolate 360 MG Tbec EC tablet Commonly known as: MYFORTIC Take 720 mg by mouth 2 (two) times daily.   norethindrone 5 MG tablet Commonly known  as: AYGESTIN Take 5-10 mg by mouth daily.   potassium chloride SA 20 MEQ tablet Commonly known as: KLOR-CON Take 20 mEq by mouth as needed (leg cramps).   predniSONE 5 MG tablet Commonly known as: DELTASONE Take 2.5 mg by mouth every morning.   SUMAtriptan 100 MG tablet Commonly known as: IMITREX Take 1 tablet (100 mg total) by mouth once as needed for up to 10 days for migraine (No more than twice per week.). May repeat in 2 hours if headache persists or recurs.   zinc gluconate 50 MG tablet Take 50 mg by mouth daily.      Allergies  Allergen Reactions  . Sulfa Antibiotics Itching and Rash    Follow-up Information    [facility]. Call in 1 day(s).   Specialty: Family Medicine Why: Please call for a post hospital follow-up appointment. Contact information: [place] [address] [phone]        Wendall Mola, MD. Call in 1 day(s).   Specialty: Internal Medicine Why: Please call for a post hospital follow-up appointment. Contact information: [place] [address] [phone]        [facility]. Call in 1 day(s).   Why: Please call for a post hospital follow-up appointment. Contact information: [address]     [place] [address] [phone]               The results of significant diagnostics from this hospitalization (including imaging, microbiology, ancillary and laboratory) are listed below for reference.    Significant Diagnostic Studies: CT Angio Head W or Wo Contrast  Result Date: 04/01/2020 CLINICAL DATA:  Dizziness, headache, nausea and vomiting. EXAM: CT ANGIOGRAPHY HEAD AND NECK TECHNIQUE: Multidetector CT imaging of the head and neck was performed using the standard protocol during bolus administration of intravenous contrast. Multiplanar CT image reconstructions and MIPs were obtained to evaluate the vascular anatomy. Carotid  stenosis measurements (when applicable) are obtained utilizing NASCET criteria, using the distal internal carotid diameter as the denominator. CONTRAST:  10mL OMNIPAQUE IOHEXOL 350 MG/ML SOLN COMPARISON:  None. FINDINGS: CT HEAD FINDINGS Brain: The brain shows a normal appearance without evidence of malformation, atrophy, old or acute small or large vessel infarction, mass lesion, hemorrhage, hydrocephalus or extra-axial collection. Vascular: No hyperdense vessel. No evidence of atherosclerotic calcification. Skull: Normal.  No traumatic finding.  No focal bone lesion. Sinuses/Orbits: Maxillary and frontal sinuses are  clear. Right ethmoid sinuses are clear. Opacified posterior ethmoid air cells on the left. 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 Mucosal thickening of the small right division of the sphenoid sinus. Other: None significant CTA NECK FINDINGS Aortic arch: Normal Right carotid system: Common carotid artery widely patent to the bifurcation. Carotid bifurcation is normal without soft or calcified plaque. Cervical ICA is normal. Left carotid system: Common carotid artery widely patent to the bifurcation. Carotid bifurcation is normal without soft or calcified plaque. Cervical ICA is normal. Vertebral arteries: Both vertebral artery origins are widely patent. Both vertebral arteries are normal through the cervical region to the foramen magnum. Skeleton: Mild spondylosis C5-6. Other neck: No mass or lymphadenopathy. Upper chest: Early emphysematous change in the upper lobes. No active process. Review of the MIP images confirms the above findings CTA HEAD FINDINGS Anterior circulation: Both internal carotid arteries are patent through the skull base and siphon regions. The anterior and middle cerebral vessels are normal without stenosis, aneurysm or vascular malformation. No vessel occlusion is seen. Posterior circulation: Both  vertebral arteries are widely patent through the foramen magnum to the basilar. No basilar stenosis. Posterior circulation branch vessels are normal. Venous sinuses: Patent and normal. Anatomic variants: None significant. Review of the MIP images confirms the above findings IMPRESSION: Head CT: Normal appearance of the brain itself. Sphenoid sinusitis left worse than right with chronic mucoperiosteal thickening and complete opacification on the left. This could be associated with headache. CT angiography of the neck: Normal. No atherosclerotic disease. No dissection. CT angiography of the head: Normal. No aneurysm, vascular malformation, stenosis or occlusion. Electronically Signed   By: Nelson Chimes M.D.   On: 04/01/2020 01:05   CT Angio Neck W and/or Wo Contrast  Result Date: 04/01/2020 CLINICAL DATA:  Dizziness, headache, nausea and vomiting. EXAM: CT ANGIOGRAPHY HEAD AND NECK TECHNIQUE: Multidetector CT imaging of the head and neck was performed using the standard protocol during bolus administration of intravenous contrast. Multiplanar CT image reconstructions and MIPs were obtained to evaluate the vascular anatomy. Carotid stenosis measurements (when applicable) are obtained utilizing NASCET criteria, using the distal internal carotid diameter as the denominator. CONTRAST:  78mL OMNIPAQUE IOHEXOL 350 MG/ML SOLN COMPARISON:  None. FINDINGS: CT HEAD FINDINGS Brain: The brain shows a normal appearance without evidence of malformation, atrophy, old or acute small or large vessel infarction, mass lesion, hemorrhage, hydrocephalus or extra-axial collection. Vascular: No hyperdense vessel. No evidence of atherosclerotic calcification. Skull: Normal.  No traumatic finding.  No focal bone lesion. Sinuses/Orbits: Maxillary and frontal sinuses are clear. Right ethmoid sinuses are clear. Opacified posterior ethmoid air cells on the left. Chronic sinusitis of the left division of the sphenoid sinus with complete  opacification and mucoperiosteal thickening. Mucosal thickening of the small right division of the sphenoid sinus. Other: None significant CTA NECK FINDINGS Aortic arch: Normal Right carotid system: Common carotid artery widely patent to the bifurcation. Carotid bifurcation is normal without soft or calcified plaque. Cervical ICA is normal. Left carotid system: Common carotid artery widely patent to the bifurcation. Carotid bifurcation is normal without soft or calcified plaque. Cervical ICA is normal. Vertebral arteries: Both vertebral artery origins are widely patent. Both vertebral arteries are normal through the cervical region to the foramen magnum. Skeleton: Mild spondylosis C5-6. Other neck: No mass or lymphadenopathy. Upper chest: Early emphysematous change in the upper lobes. No active process. Review of the MIP images confirms the above findings  CTA HEAD FINDINGS Anterior circulation: Both internal carotid arteries are patent through the skull base and siphon regions. The anterior and middle cerebral vessels are normal without stenosis, aneurysm or vascular malformation. No vessel occlusion is seen. Posterior circulation: Both vertebral arteries are widely patent through the foramen magnum to the basilar. No basilar stenosis. Posterior circulation branch vessels are normal. Venous sinuses: Patent and normal. Anatomic variants: None significant. Review of the MIP images confirms the above findings IMPRESSION: Head CT: Normal appearance of the brain itself. Sphenoid sinusitis left worse than right with chronic mucoperiosteal thickening and complete opacification on the left. This could be associated with headache. CT angiography of the neck: Normal. No atherosclerotic disease. No dissection. CT angiography of the head: Normal. No aneurysm, vascular malformation, stenosis or occlusion. Electronically Signed   By: Nelson Chimes M.D.   On: 04/01/2020 01:05   MR BRAIN WO CONTRAST  Result Date:  04/03/2020 CLINICAL DATA:  Headache EXAM: MRI HEAD WITHOUT CONTRAST TECHNIQUE: Multiplanar, multiecho pulse sequences of the brain and surrounding structures were obtained without intravenous contrast. COMPARISON:  04/01/2020 CTA FINDINGS: Brain: No acute infarction, hemorrhage, hydrocephalus, extra-axial collection or mass lesion. No white matter disease or brain atrophy. No evidence of intracranial spread of infection. Vascular: The left superior ophthalmic vein is brighter than the right on FLAIR images but is normally enhancing on postcontrast imaging, as is the cavernous sinuses. Skull and upper cervical spine: Normal marrow signal Sinuses/Orbits: Mucosal thickening in the ethmoid and sphenoid sinuses with complete opacification of the left sphenoid sinus which shows sclerotic wall thickening and possible expansion. The left sphenoid ostium is widened by CT. IMPRESSION: 1. Normal appearance of the brain. 2. Active sinusitis with complete opacification of the left sphenoid sinus which is obstructed at the sphenoid ethmoidal recess. Electronically Signed   By: Monte Fantasia M.D.   On: 04/03/2020 05:31   MR MRV HEAD W WO CONTRAST  Result Date: 04/03/2020 CLINICAL DATA:  Headache, rule out dural venous sinus thrombosis EXAM: MR VENOGRAM HEAD WITHOUT AND WITH CONTRAST TECHNIQUE: Angiographic images of the intracranial venous structures were obtained using MRV technique without and with intravenous contrast. CONTRAST:  59mL GADAVIST GADOBUTROL 1 MMOL/ML IV SOLN COMPARISON:  04/01/2020 CTA FINDINGS: Widely patent dural venous sinuses with dominant right-sided transverse sigmoid outflow. Paired deep veins are also unremarkable. On time-of-flight the left superior ophthalmic vein is less well seen but is symmetrically enhancing on the post-contrast MRV. Symmetric and normal appearing cavernous sinus enhancement without bulging. IMPRESSION: Negative intracranial MRV. Electronically Signed   By: Monte Fantasia M.D.    On: 04/03/2020 05:26    Microbiology: Recent Results (from the past 240 hour(s))  SARS CORONAVIRUS 2 (TAT 6-24 HRS) Nasopharyngeal Nasopharyngeal Swab     Status: None   Collection Time: 04/01/20  4:57 PM   Specimen: Nasopharyngeal Swab  Result Value Ref Range Status   SARS Coronavirus 2 NEGATIVE NEGATIVE Final    Comment: (NOTE) SARS-CoV-2 target nucleic acids are NOT DETECTED.  The SARS-CoV-2 RNA is generally detectable in upper and lower respiratory specimens during the acute phase of infection. Negative results do not preclude SARS-CoV-2 infection, do not rule out co-infections with other pathogens, and should not be used as the sole basis for treatment or other patient management decisions. Negative results must be combined with clinical observations, patient history, and epidemiological information. The expected result is Negative.  Fact Sheet for Patients: SugarRoll.be  Fact Sheet for Healthcare Providers: https://www.woods-mathews.com/  This test is not yet  approved or cleared by the [place] and  has been authorized for detection and/or diagnosis of SARS-CoV-2 by FDA under an Emergency Use Authorization (EUA). This EUA will remain  in effect (meaning this test can be used) for the duration of the COVID-19 declaration under Se ction 564(b)(1) of the Act, 21 U.S.C. section 360bbb-3(b)(1), unless the authorization is terminated or revoked sooner.  Performed at [facility], [place] [address] [place], [place] [address]   Resp Panel by RT-PCR (Flu A&B, Covid) Nasopharyngeal Swab     Status: None   Collection Time: 04/03/20  3:56 AM   Specimen: Nasopharyngeal Swab; Nasopharyngeal(NP) swabs in vial transport medium  Result Value Ref Range Status   SARS Coronavirus 2 by RT PCR NEGATIVE NEGATIVE Final    Comment: (NOTE) SARS-CoV-2 target nucleic acids are NOT DETECTED.  The SARS-CoV-2 RNA is generally detectable in  upper respiratory specimens during the acute phase of infection. The lowest concentration of SARS-CoV-2 viral copies this assay can detect is 138 copies/mL. A negative result does not preclude SARS-Cov-2 infection and should not be used as the sole basis for treatment or other patient management decisions. A negative result may occur with  improper specimen collection/handling, submission of specimen other than nasopharyngeal swab, presence of viral mutation(s) within the areas targeted by this assay, and inadequate number of viral copies(<138 copies/mL). A negative result must be combined with clinical observations, patient history, and epidemiological information. The expected result is Negative.  Fact Sheet for Patients:  EntrepreneurPulse.com.au  Fact Sheet for Healthcare Providers:  IncredibleEmployment.be  This test is no t yet approved or cleared by the [place] FDA and  has been authorized for detection and/or diagnosis of SARS-CoV-2 by FDA under an Emergency Use Authorization (EUA). This EUA will remain  in effect (meaning this test can be used) for the duration of the COVID-19 declaration under Section 564(b)(1) of the Act, 21 U.S.C.section 360bbb-3(b)(1), unless the authorization is terminated  or revoked sooner.       Influenza A by PCR NEGATIVE NEGATIVE Final   Influenza B by PCR NEGATIVE NEGATIVE Final    Comment: (NOTE) The Xpert Xpress SARS-CoV-2/FLU/RSV plus assay is intended as an aid in the diagnosis of influenza from Nasopharyngeal swab specimens and should not be used as a sole basis for treatment. Nasal washings and aspirates are unacceptable for Xpert Xpress SARS-CoV-2/FLU/RSV testing.  Fact Sheet for Patients: EntrepreneurPulse.com.au  Fact Sheet for Healthcare Providers: IncredibleEmployment.be  This test is not yet approved or cleared by the [place] FDA and has been  authorized for detection and/or diagnosis of SARS-CoV-2 by FDA under an Emergency Use Authorization (EUA). This EUA will remain in effect (meaning this test can be used) for the duration of the COVID-19 declaration under Section 564(b)(1) of the Act, 21 U.S.C. section 360bbb-3(b)(1), unless the authorization is terminated or revoked.  Performed at [facility], [place] [address], [place], [place] [address]      Labs: Basic Metabolic Panel: Recent Labs  Lab 03/31/20 2333 04/01/20 1656 04/02/20 2100 04/04/20 0312  NA 133* 137 135 134*  K 3.7 3.7 3.7 4.0  CL 103 105 104 103  CO2 20* 21* 23 21*  GLUCOSE 91 127* 95 114*  BUN 14 15 10  21*  CREATININE 1.21* 1.28* 1.04* 1.10*  CALCIUM 9.3 9.4 9.3 9.3  MG 1.7  --   --   --    Liver Function Tests: Recent Labs  Lab 03/31/20 2333 04/02/20  2100  AST 20 14*  ALT 14 12  ALKPHOS 54 49  BILITOT 0.6 0.6  PROT 9.0* 8.3*  ALBUMIN 3.8 3.4*   Recent Labs  Lab 04/02/20 2100  LIPASE 30   No results for input(s): AMMONIA in the last 168 hours. CBC: Recent Labs  Lab 03/31/20 2333 04/01/20 1656 04/02/20 2100 04/04/20 0312  WBC 9.1 8.5 7.4 8.8  NEUTROABS 6.7 7.4  --   --   HGB 11.4* 12.4 10.9* 11.5*  HCT 35.5* 40.6 34.4* 35.8*  MCV 91.5 94.2 92.2 89.1  PLT 246 238 241 248   Cardiac Enzymes: No results for input(s): CKTOTAL, CKMB, CKMBINDEX, TROPONINI in the last 168 hours. BNP: BNP (last 3 results) No results for input(s): BNP in the last 8760 hours.  ProBNP (last 3 results) No results for input(s): PROBNP in the last 8760 hours.  CBG: No results for input(s): GLUCAP in the last 168 hours.     Signed:  Kayleen Memos, MD [facility] 04/04/2020, 12:39 PM

## 2020-04-04 NOTE — Progress Notes (Signed)
Subjective: Patient reports significant improvement in her headache with DHE.  She had some recurrence overnight, but that abated after her morning dose.  Currently she has mild tenderness 3/10.  Exam: Vitals:   04/04/20 0829 04/04/20 0950  BP: 134/84 (!) 145/83  Pulse: 72 67  Resp: 18   Temp: 98.8 F (37.1 C)   SpO2: 96%    Gen: In bed, NAD Resp: non-labored breathing, no acute distress Abd: soft, nt  Neuro: MS: Awake, alert, interactive and appropriate CN: Visual fields full, face symmetric Motor: No drift  Pertinent Labs: ESR is 110, on review of her records from [facility], this is persistently elevated, was in the 70s in January.  Her CRPs are routinely above five.  Impression: 45 year old female with a history of headaches which sound consistent with migraine who presents with headache most consistent with status migrainosus.  Though she does have an elevated ESR, given her age, I do not think that temporal arteritis is a significant consideration.  Also given that she responds to migraine treatments I think this is most likely and I would treat it as such.  With her improvement, I do not think we need to continue to keep her hospitalized, but I would favor continuing gabapentin for a few days as I have had some success with this in this setting.  I would also give Imitrex  Recommendations: 1) will give a dose of depakote/magnesium 2) gabapentin $RemoveBefor'300mg'UvuiyMmdYahO$  TID x 3 days.  3) Imitrex 100 mg at onset of severe headache, would not use more than twice per week. 4) if she continues to have problems with headaches, could consider starting an abortive agent as an outpatient.  Roland Rack, MD [facility] [phone]  If 7pm- 7am, please page neurology on call as listed in [place].

## 2020-04-08 DIAGNOSIS — Z9289 Personal history of other medical treatment: Secondary | ICD-10-CM | POA: Diagnosis not present

## 2020-04-08 DIAGNOSIS — Z1329 Encounter for screening for other suspected endocrine disorder: Secondary | ICD-10-CM | POA: Diagnosis not present

## 2020-04-08 DIAGNOSIS — I10 Essential (primary) hypertension: Secondary | ICD-10-CM | POA: Diagnosis not present

## 2020-04-16 NOTE — Progress Notes (Signed)
[facility]    Provider:  Dr Jaynee Eagles Requesting Provider: Kayleen Memos, DO Primary Care Provider:  Medicine, [facility]  CC:  migraines  HPI:  Christine Todd is a 45 y.o. female here as requested by Kayleen Memos, DO for migraines.  Past medical history SLE with Lupus nephritis.  Patient reported worsening headaches, she had been to the emergency room and was given medication without improvement, CT diagnosed a sinus infection but antibiotics also did not help, she was vomiting, attempted medical care multiple times (4 ER visits prior to admission inpatient), this is the first time something like this that ever happened.  Diagnosed with status migrainosus.  MRI of the brain April 03, 2020 showed normal appearance of the brain but active sinusitis with complete opacification of the left sphenoid sinus, she was treated inpatient by neurology and felt greatly improved.   She has completed the antibiotics. She is feeling slightly better not completely. She has had migraines for many years, 20 years ago, they were episodic if she felt it coming she would have tylenol and it was fine. In the last several years she developed more headaches as opposed to migraines. End of February she had severe refractory migraines, left side, severe, pulsating, pounding and throbbing, nausea and vomiting, also in her neck, light/sound sensitivity, went to the ER 4x until she was admitted. She had left severe sphenoid sinusitis. She had a headache slightly earlier today but overall doing well since the hospital. No congestion but she has been blowing her nose. She has a hx of headaches for several years, migraines maybe 1x a month treated with tylenol. No other focal neurologic deficits, associated symptoms, inciting events or modifiable factors.  Medications tried that can be used in migraine management (from a thorough review of records): Tylenol, amlodipine, aspirin, atenolol, Decadron  injections, DHE injections, Benadryl injections, gabapentin, hydrocodone, ibuprofen, indomethacin, Toradol injections, lisinopril, mag sulfate, meclizine, Robaxin, Reglan tablets and injection, Zofran tablets and injections and disintegrating tablets, Deltasone tablets, Compazine injections, sumatriptan, Depacon/Depakote, amitriptyline/nortriptyline.  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   Reviewed notes, labs and imaging from outside physicians, which showed:  MRI brain 04/03/2020: 1. Normal appearance of the brain. 2. Active sinusitis with complete opacification of the left sphenoid sinus which is obstructed at the sphenoid ethmoidal recess.  Last BMP showed BUN 21 and creatinine 1.10 b12 550  Review of Systems: Patient complains of symptoms per HPI as well as the following symptoms: headache. Pertinent negatives and positives per HPI. All others negative.   Social History   Socioeconomic History  . Marital status: Single    Spouse name: Not on file  . Number of children: Not on file  . Years of education: Not on file  . Highest education level: Not on file  Occupational History  . Occupation: Scientist, water quality  Tobacco Use  . Smoking status: Never Smoker  . Smokeless tobacco: Never Used  Vaping Use  . Vaping Use: Never used  Substance and Sexual Activity  . Alcohol use: Yes    Comment: rarely   . Drug use: No  . Sexual activity: Yes    Birth control/protection: None  Other Topics Concern  . Not on file  Social History Narrative   Lives alone   Left handed   Caffeine: rarely   Social Determinants of Health   Financial Resource Strain: Not on file  Food Insecurity: Not on file  Transportation Needs: Not on file  Physical Activity: Not  on file  Stress: Not on file  Social Connections: Not on file  Intimate Partner Violence: Not on file    Family History  Problem Relation Age of Onset  . Diabetes Other   . Cancer Other   . Hypertension Other   . Diabetes  Father   . Migraines Sister     Past Medical History:  Diagnosis Date  . Anemia   . Arthritis    "hands and feet" (07/04/2014)  . GERD (gastroesophageal reflux disease)   . Headache    "weekly" (07/04/2014)  . History of blood transfusion 07/04/2014   "related to anemia"  . Pneumonia "several times"  . 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 SLE (systemic lupus erythematosus) ([place])     Patient Active Problem List   Diagnosis Date Noted  . Status migrainosus 04/03/2020  . Symptomatic anemia 07/04/2014  . Benign essential HTN 07/04/2014  . Hypokalemia 07/04/2014  . FOOT PAIN, LEFT 12/10/2009  . KNEE PAIN 11/13/2008  . SKIN RASH 11/13/2008  . UTI 11/09/2008  . Iron deficiency anemia 11/01/2008  . MICROSCOPIC HEMATURIA 11/01/2008  . INSOMNIA 07/31/2008  . HEADACHE 07/31/2008  . ABSCESS, SKIN 04/27/2008  . FREQUENCY, URINARY 03/01/2008  . OVARIAN CYST, RUPTURED 11/08/2007  . GERD 09/24/2007  . Lupus (systemic lupus erythematosus) ([place]) 11/25/2006  . CANDIDIASIS 11/18/2006  . SORE THROAT 11/18/2006  . ABNORMAL RESULT, FUNCTION STUDY, LIVER 11/18/2006    Past Surgical History:  Procedure Laterality Date  . DILATION AND CURETTAGE OF UTERUS  2009  . RENAL BIOPSY, PERCUTANEOUS  ?2013    Current Outpatient Medications  Medication Sig Dispense Refill  . amLODipine (NORVASC) 10 MG tablet Take 10 mg by mouth daily.    . Cholecalciferol (DIALYVITE VITAMIN D 5000 PO) Take 10,000 Units by mouth daily.    Marland Kitchen HYDROcodone-acetaminophen (NORCO) 5-325 MG tablet Take 1 tablet by mouth every 4 (four) hours as needed for moderate pain. 20 tablet 0  . hydroxychloroquine (PLAQUENIL) 200 MG tablet Take 400 mg by mouth daily.    Marland Kitchen ibuprofen (ADVIL) 200 MG tablet Take 200 mg by mouth as needed (pain).    Marland Kitchen lisinopril (ZESTRIL) 20 MG tablet Take 20 mg by mouth daily.    . metoCLOPramide (REGLAN) 10 MG tablet Take 1 tablet (10 mg total) by mouth every 6 (six) hours as needed for nausea  (nausea/headache). 6 tablet 0  . Multiple Vitamin (MULTIVITAMIN ADULT PO) Take 1 tablet by mouth daily.    . mycophenolate (MYFORTIC) 360 MG TBEC EC tablet Take 720 mg by mouth 2 (two) times daily.    . norethindrone (AYGESTIN) 5 MG tablet Take 5-10 mg by mouth daily.    . ondansetron (ZOFRAN-ODT) 8 MG disintegrating tablet Take 1 tablet (8 mg total) by mouth every 8 (eight) hours as needed for nausea or vomiting. 20 tablet 0  . potassium chloride SA (KLOR-CON) 20 MEQ tablet Take 20 mEq by mouth as needed (leg cramps).    . predniSONE (DELTASONE) 5 MG tablet Take 2.5 mg by mouth every morning.    . rizatriptan (MAXALT-MLT) 10 MG disintegrating tablet Take 1 tablet (10 mg total) by mouth as needed for migraine. May repeat in 2 hours if needed 9 tablet 11  . zinc gluconate 50 MG tablet Take 50 mg by mouth daily.    Marland Kitchen gabapentin (NEURONTIN) 300 MG capsule Take 1 capsule (300 mg total) by mouth 3 (three) times daily for 3 days. (Patient not  taking: Reported on 04/17/2020) 9 capsule 0  . SUMAtriptan (IMITREX) 100 MG tablet Take 1 tablet (100 mg total) by mouth once as needed for up to 10 days for migraine (No more than twice per week.). May repeat in 2 hours if headache persists or recurs. 10 tablet 0   No current facility-administered medications for this visit.    Allergies as of 04/17/2020 - Review Complete 04/17/2020  Allergen Reaction Noted  . Sulfa antibiotics Itching and Rash 03/17/2011    Vitals: BP 102/68 (BP Location: Right Arm, Patient Position: Sitting)   Pulse 90   Ht 5' 7.5" (1.715 m)   Wt 168 lb (76.2 kg)   SpO2 98%   BMI 25.92 kg/m  Last Weight:  Wt Readings from Last 1 Encounters:  04/17/20 168 lb (76.2 kg)   Last Height:   Ht Readings from Last 1 Encounters:  04/17/20 5' 7.5" (1.715 m)     Physical exam: Exam: Gen: NAD, conversant, well nourised, well groomed                     CV: RRR, no MRG. No Carotid Bruits. No peripheral edema, warm, nontender Eyes:  Conjunctivae clear without exudates or hemorrhage  Neuro: Detailed Neurologic Exam  Speech:    Speech is normal; fluent and spontaneous with normal comprehension.  Cognition:    The patient is oriented to person, place, and time;     recent and remote memory intact;     language fluent;     normal attention, concentration,     fund of knowledge Cranial Nerves:    The pupils are equal, round, and reactive to light. The fundi are flat. Visual fields are full to finger confrontation. Extraocular movements are intact. Trigeminal sensation is intact and the muscles of mastication are normal. The face is symmetric. The palate elevates in the midline. Hearing intact. Voice is normal. Shoulder shrug is normal. The tongue has normal motion without fasciculations.   Coordination:    Normal finger to nose  Gait:    Normal native gait  Motor Observation:    No asymmetry, no atrophy, and no involuntary movements noted. Tone:    Normal muscle tone.    Posture:    Posture is normal. normal erect    Strength:    Strength is V/V in the upper and lower limbs.      Sensation: intact to LT     Reflex Exam:  DTR's:    Deep tendon reflexes in the upper and lower extremities are symmetrical bilaterally.   Toes:    The toes are downgoing bilaterally.   Clonus:    Clonus is absent.    Assessment/Plan:  45 year old with severe headaches left side. Imaging revealed sphenoid sinusitis. She has been having headaches for several years(different than her migraines) and I wonder how long the sphenoid sinusitis was there given the sclerotic wall thickening and expansion. Need to re-image and send to ENT.Maxalt and zofran acutely. If sinuses are definitely treated and she continues to have headaches we can try preventative but would like to see if she improves with complete resolution of the changes seen in the ethmoid and sphenoid sinuses.   MRI brain showed Sinuses/Orbits: Mucosal thickening in the  ethmoid and sphenoid sinuses with complete opacification of the left sphenoid sinus which shows sclerotic wall thickening and possible expansion. The left sphenoid ostium is widened by CT.  Orders Placed This Encounter  Procedures  . CT MAXILLOFACIAL  WO CONTRAST  . Ambulatory referral to ENT   Meds ordered this encounter  Medications  . rizatriptan (MAXALT-MLT) 10 MG disintegrating tablet    Sig: Take 1 tablet (10 mg total) by mouth as needed for migraine. May repeat in 2 hours if needed    Dispense:  9 tablet    Refill:  11  . ondansetron (ZOFRAN-ODT) 8 MG disintegrating tablet    Sig: Take 1 tablet (8 mg total) by mouth every 8 (eight) hours as needed for nausea or vomiting.    Dispense:  20 tablet    Refill:  0    Cc: Kayleen Memos DO,  Medicine, [facility]  Sarina Ill, MD  [facility] [address] [place], [place] [address]  Phone [phone] Fax [phone]

## 2020-04-17 ENCOUNTER — Ambulatory Visit (INDEPENDENT_AMBULATORY_CARE_PROVIDER_SITE_OTHER): Payer: BC Managed Care – PPO | Admitting: Neurology

## 2020-04-17 ENCOUNTER — Encounter: Payer: Self-pay | Admitting: Neurology

## 2020-04-17 VITALS — BP 102/68 | HR 90 | Ht 67.5 in | Wt 168.0 lb

## 2020-04-17 DIAGNOSIS — R519 Headache, unspecified: Secondary | ICD-10-CM | POA: Diagnosis not present

## 2020-04-17 DIAGNOSIS — J0130 Acute sphenoidal sinusitis, unspecified: Secondary | ICD-10-CM

## 2020-04-17 MED ORDER — ONDANSETRON 8 MG PO TBDP: 8.0000 mg | ORAL_TABLET | Freq: Three times a day (TID) | ORAL | 0 refills | Status: AC | PRN

## 2020-04-17 MED ORDER — RIZATRIPTAN BENZOATE 10 MG PO TBDP: 10.0000 mg | ORAL_TABLET | ORAL | 11 refills | Status: AC | PRN

## 2020-04-17 NOTE — Patient Instructions (Signed)
I will repeat imaging on your sinuses  Nausea: Ondansetron Headache/migraines: maxalt(rizatriptan) Please take one tablet at the onset of your headache. If it does not improve the symptoms please take one additional tablet. Do not take more then 2 tablets in 24hrs. Do not take use more then 2 to 3 times in a week.  We will see you back in 3-4 months if you still have headaches/migraines we can discuss headache preventative medication for you to try   Migraine Headache A migraine headache is an intense, throbbing pain on one side or both sides of the head. Migraine headaches may also cause other symptoms, such as nausea, vomiting, and sensitivity to light and noise. A migraine headache can last from 4 hours to 3 days. Talk with your doctor about what things may bring on (trigger) your migraine headaches. What are the causes? The exact cause of this condition is not known. However, a migraine may be caused when nerves in the brain become irritated and release chemicals that cause inflammation of blood vessels. This inflammation causes pain. This condition may be triggered or caused by:  Drinking alcohol.  Smoking.  Taking medicines, such as: ? Medicine used to treat chest pain (nitroglycerin). ? Birth control pills. ? Estrogen. ? Certain blood pressure medicines.  Eating or drinking products that contain nitrates, glutamate, aspartame, or tyramine. Aged cheeses, chocolate, or caffeine may also be triggers.  Doing physical activity. Other things that may trigger a migraine headache include:  Menstruation.  Pregnancy.  Hunger.  Stress.  Lack of sleep or too much sleep.  Weather changes.  Fatigue. What increases the risk? The following factors may make you more likely to experience migraine headaches:  Being a certain age. This condition is more common in people who are 8-33 years old.  Being female.  Having a family history of migraine headaches.  Being  Caucasian.  Having a mental health condition, such as depression or anxiety.  Being obese. What are the signs or symptoms? The main symptom of this condition is pulsating or throbbing pain. This pain may:  Happen in any area of the head, such as on one side or both sides.  Interfere with daily activities.  Get worse with physical activity.  Get worse with exposure to bright lights or loud noises. Other symptoms may include:  Nausea.  Vomiting.  Dizziness.  General sensitivity to bright lights, loud noises, or smells. Before you get a migraine headache, you may get warning signs (an aura). An aura may include:  Seeing flashing lights or having blind spots.  Seeing bright spots, halos, or zigzag lines.  Having tunnel vision or blurred vision.  Having numbness or a tingling feeling.  Having trouble talking.  Having muscle weakness. Some people have symptoms after a migraine headache (postdromal phase), such as:  Feeling tired.  Difficulty concentrating. How is this diagnosed? A migraine headache can be diagnosed based on:  Your symptoms.  A physical exam.  Tests, such as: ? CT scan or an MRI of the head. These imaging tests can help rule out other causes of headaches. ? Taking fluid from the spine (lumbar puncture) and analyzing it (cerebrospinal fluid analysis, or CSF analysis). How is this treated? This condition may be treated with medicines that:  Relieve pain.  Relieve nausea.  Prevent migraine headaches. Treatment for this condition may also include:  Acupuncture.  Lifestyle changes like avoiding foods that trigger migraine headaches.  Biofeedback.  Cognitive behavioral therapy. Follow these instructions at home: Medicines  Take over-the-counter and prescription medicines only as told by your health care provider.  Ask your health care provider if the medicine prescribed to you: ? Requires you to avoid driving or using heavy  machinery. ? Can cause constipation. You may need to take these actions to prevent or treat constipation:  Drink enough fluid to keep your urine pale yellow.  Take over-the-counter or prescription medicines.  Eat foods that are high in fiber, such as beans, whole grains, and fresh fruits and vegetables.  Limit foods that are high in fat and processed sugars, such as fried or sweet foods. Lifestyle  Do not drink alcohol.  Do not use any products that contain nicotine or tobacco, such as cigarettes, e-cigarettes, and chewing tobacco. If you need help quitting, ask your health care provider.  Get at least 8 hours of sleep every night.  Find ways to manage stress, such as meditation, deep breathing, or yoga. General instructions  Keep a journal to find out what may trigger your migraine headaches. For example, write down: ? What you eat and drink. ? How much sleep you get. ? Any change to your diet or medicines.  If you have a migraine headache: ? Avoid things that make your symptoms worse, such as bright lights. ? It may help to lie down in a dark, quiet room. ? Do not drive or use heavy machinery. ? Ask your health care provider what activities are safe for you while you are experiencing symptoms.  Keep all follow-up visits as told by your health care provider. This is important.      Contact a health care provider if:  You develop symptoms that are different or more severe than your usual migraine headache symptoms.  You have more than 15 headache days in one month. Get help right away if:  Your migraine headache becomes severe.  Your migraine headache lasts longer than 72 hours.  You have a fever.  You have a stiff neck.  You have vision loss.  Your muscles feel weak or like you cannot control them.  You start to lose your balance often.  You have trouble walking.  You faint.  You have a seizure. Summary  A migraine headache is an intense, throbbing pain  on one side or both sides of the head. Migraines may also cause other symptoms, such as nausea, vomiting, and sensitivity to light and noise.  This condition may be treated with medicines and lifestyle changes. You may also need to avoid certain things that trigger a migraine headache.  Keep a journal to find out what may trigger your migraine headaches.  Contact your health care provider if you have more than 15 headache days in a month or you develop symptoms that are different or more severe than your usual migraine headache symptoms. This information is not intended to replace advice given to you by your health care provider. Make sure you discuss any questions you have with your health care provider. Document Revised: 05/13/2018 Document Reviewed: 03/03/2018 Elsevier Patient Education  [place].

## 2020-04-17 NOTE — Progress Notes (Signed)
Meds taken currently or in past: Sumatriptan, Reglan, Gabapentin, Percocet, Lisinopril, Amlodipine, Atenolol, Zofran, Prednisone, Ibuprofen, Tylenol, Naproxen, Methocarbamol.

## 2020-04-22 ENCOUNTER — Telehealth: Payer: Self-pay | Admitting: Neurology

## 2020-04-22 NOTE — Telephone Encounter (Signed)
[employer] Josem Kaufmann: [id number] (exp. 04/13/20 to 10/18/20) [place] community pending uploaded notes on the portal.   Patient is scheduled at GI for 04/29/20.

## 2020-04-24 NOTE — Telephone Encounter (Signed)
I called [employer] to check the status they received the upload and it is still in review.

## 2020-04-25 NOTE — Telephone Encounter (Signed)
I called [employer] to check the status it is still pending.

## 2020-04-29 ENCOUNTER — Ambulatory Visit
Admission: RE | Admit: 2020-04-29 | Discharge: 2020-04-29 | Disposition: A | Discharge status: Home / Self Care | Payer: BC Managed Care – PPO | Source: Ambulatory Visit | Attending: Neurology | Admitting: Neurology

## 2020-04-29 ENCOUNTER — Other Ambulatory Visit: Payer: Self-pay

## 2020-04-29 DIAGNOSIS — R519 Headache, unspecified: Secondary | ICD-10-CM

## 2020-04-29 DIAGNOSIS — J0130 Acute sphenoidal sinusitis, unspecified: Secondary | ICD-10-CM

## 2020-04-29 NOTE — Telephone Encounter (Signed)
Yes she did have it done because she also has [employer] so they are going to file it under [place] but I was still going to try and get the medicaid one approved.

## 2020-04-29 NOTE — Telephone Encounter (Signed)
Medicaid [employer] community plan did not approve the CT Maxillofacial.   "Based on the guidelines sinus and facial imaging and preface to the imaging guidelines we cannot approve this request. Your records show that you may have a problem with your sinus. The request cannot be approved because: you must have one of the following. Failure to improve a four week trial of antibiotics and/or allergy treatment. Your doctor is concerned that you have a complex swelling of your sinus (complicated sinusitis) four or more episodes within the past 12 months without signs or symptoms between the.  A study similar to the one requested has recently been performed. The results of that study showed your doctor what they needed to see in order to treat your problem (condition)."  There are two options a peer to peer the phone number is [phone] option 1 and it would need to be completed within 14 business days from 04/26/2020.  Or A fax of reconsideration a letter written for the reason of this exam to be performed.  Fax # [phone] Attn: [employer] reconsideration. Also needs to be submitted within 14 business days from 04/26/2020.

## 2020-04-29 NOTE — Telephone Encounter (Signed)
I received the results today so she had it done.

## 2020-04-30 ENCOUNTER — Telehealth: Payer: Self-pay | Admitting: Neurology

## 2020-04-30 ENCOUNTER — Other Ambulatory Visit: Payer: Self-pay | Admitting: Neurology

## 2020-04-30 DIAGNOSIS — J323 Chronic sphenoidal sinusitis: Secondary | ICD-10-CM

## 2020-04-30 NOTE — Telephone Encounter (Signed)
-----   Message from Melvenia Beam, MD sent at 04/29/2020 11:18 AM EDT ----- Sphenoid sinuses not improved. Patient needs to see ENT, if she is ok with it let me know and I can place referral thanks

## 2020-04-30 NOTE — Telephone Encounter (Signed)
Called the patient to review the results from the CT face. Advised the patient there was no improvement in the sphenoid sinuses. Informed that Dr Jaynee Eagles recommends the patient be referred to ENT to further evaluate. Christine Todd is agreeable to referral. Advised we would place and she should be on look out for a call

## 2020-06-05 ENCOUNTER — Ambulatory Visit: Payer: Self-pay | Admitting: Neurology

## 2020-06-05 DIAGNOSIS — J323 Chronic sphenoidal sinusitis: Secondary | ICD-10-CM | POA: Diagnosis not present

## 2020-06-05 DIAGNOSIS — R519 Headache, unspecified: Secondary | ICD-10-CM | POA: Diagnosis not present

## 2020-06-13 DIAGNOSIS — R42 Dizziness and giddiness: Secondary | ICD-10-CM | POA: Diagnosis not present

## 2020-06-13 DIAGNOSIS — R531 Weakness: Secondary | ICD-10-CM | POA: Diagnosis not present

## 2020-07-17 DIAGNOSIS — N939 Abnormal uterine and vaginal bleeding, unspecified: Secondary | ICD-10-CM | POA: Diagnosis not present

## 2020-08-30 DIAGNOSIS — J3489 Other specified disorders of nose and nasal sinuses: Secondary | ICD-10-CM | POA: Diagnosis not present

## 2020-09-02 ENCOUNTER — Other Ambulatory Visit: Payer: Self-pay | Admitting: Otolaryngology

## 2020-09-02 DIAGNOSIS — J323 Chronic sphenoidal sinusitis: Secondary | ICD-10-CM | POA: Diagnosis not present

## 2020-09-09 DIAGNOSIS — H40003 Preglaucoma, unspecified, bilateral: Secondary | ICD-10-CM | POA: Diagnosis not present

## 2020-09-09 DIAGNOSIS — Z79899 Other long term (current) drug therapy: Secondary | ICD-10-CM | POA: Diagnosis not present

## 2020-09-11 DIAGNOSIS — D219 Benign neoplasm of connective and other soft tissue, unspecified: Secondary | ICD-10-CM | POA: Diagnosis not present

## 2020-09-11 DIAGNOSIS — N939 Abnormal uterine and vaginal bleeding, unspecified: Secondary | ICD-10-CM | POA: Diagnosis not present

## 2020-12-05 DIAGNOSIS — D649 Anemia, unspecified: Secondary | ICD-10-CM | POA: Diagnosis not present

## 2020-12-11 DIAGNOSIS — D250 Submucous leiomyoma of uterus: Secondary | ICD-10-CM | POA: Diagnosis not present

## 2020-12-11 DIAGNOSIS — N939 Abnormal uterine and vaginal bleeding, unspecified: Secondary | ICD-10-CM | POA: Diagnosis not present

## 2020-12-30 DIAGNOSIS — D250 Submucous leiomyoma of uterus: Secondary | ICD-10-CM | POA: Diagnosis not present

## 2020-12-30 DIAGNOSIS — N939 Abnormal uterine and vaginal bleeding, unspecified: Secondary | ICD-10-CM | POA: Diagnosis not present

## 2021-02-21 ENCOUNTER — Ambulatory Visit (HOSPITAL_COMMUNITY)
Admission: EM | Admit: 2021-02-21 | Discharge: 2021-02-21 | Payer: BC Managed Care – PPO | Attending: Family Medicine | Admitting: Family Medicine

## 2021-02-21 ENCOUNTER — Encounter (HOSPITAL_COMMUNITY): Payer: Self-pay | Admitting: Emergency Medicine

## 2021-02-21 ENCOUNTER — Other Ambulatory Visit: Payer: Self-pay

## 2021-02-21 ENCOUNTER — Emergency Department (HOSPITAL_COMMUNITY): Payer: BC Managed Care – PPO

## 2021-02-21 ENCOUNTER — Emergency Department (HOSPITAL_COMMUNITY)
Admission: EM | Admit: 2021-02-21 | Discharge: 2021-02-22 | Disposition: A | Discharge status: Home / Self Care | Payer: BC Managed Care – PPO | Attending: Emergency Medicine | Admitting: Emergency Medicine

## 2021-02-21 DIAGNOSIS — I10 Essential (primary) hypertension: Secondary | ICD-10-CM | POA: Insufficient documentation

## 2021-02-21 DIAGNOSIS — R7989 Other specified abnormal findings of blood chemistry: Secondary | ICD-10-CM | POA: Insufficient documentation

## 2021-02-21 DIAGNOSIS — D649 Anemia, unspecified: Secondary | ICD-10-CM | POA: Insufficient documentation

## 2021-02-21 DIAGNOSIS — D72819 Decreased white blood cell count, unspecified: Secondary | ICD-10-CM | POA: Diagnosis not present

## 2021-02-21 DIAGNOSIS — R109 Unspecified abdominal pain: Secondary | ICD-10-CM | POA: Diagnosis not present

## 2021-02-21 DIAGNOSIS — Z79899 Other long term (current) drug therapy: Secondary | ICD-10-CM | POA: Insufficient documentation

## 2021-02-21 LAB — COMPREHENSIVE METABOLIC PANEL
ALT: 13 U/L (ref 0–44)
AST: 19 U/L (ref 15–41)
Albumin: 3.7 g/dL (ref 3.5–5.0)
Alkaline Phosphatase: 59 U/L (ref 38–126)
Anion gap: 7 (ref 5–15)
BUN: 6 mg/dL (ref 6–20)
CO2: 24 mmol/L (ref 22–32)
Calcium: 8.9 mg/dL (ref 8.9–10.3)
Chloride: 106 mmol/L (ref 98–111)
Creatinine, Ser: 1.13 mg/dL — ABNORMAL HIGH (ref 0.44–1.00)
GFR, Estimated: >60 mL/min (ref >60)
Glucose, Bld: 112 mg/dL — ABNORMAL HIGH (ref 70–99)
Potassium: 3.7 mmol/L (ref 3.5–5.1)
Sodium: 137 mmol/L (ref 135–145)
Total Bilirubin: 0.3 mg/dL (ref 0.3–1.2)
Total Protein: 8.4 g/dL — ABNORMAL HIGH (ref 6.5–8.1)

## 2021-02-21 LAB — CBC WITH DIFFERENTIAL/PLATELET
Abs Immature Granulocytes: 0 10*3/uL (ref 0.00–0.07)
Basophils Absolute: 0 10*3/uL (ref 0.0–0.1)
Basophils Relative: 1 %
Eosinophils Absolute: 0.1 10*3/uL (ref 0.0–0.5)
Eosinophils Relative: 3 %
HCT: 34.8 % — ABNORMAL LOW (ref 36.0–46.0)
Hemoglobin: 11.1 g/dL — ABNORMAL LOW (ref 12.0–15.0)
Immature Granulocytes: 0 %
Lymphocytes Relative: 33 %
Lymphs Abs: 1.2 10*3/uL (ref 0.7–4.0)
MCH: 29.3 pg (ref 26.0–34.0)
MCHC: 31.9 g/dL (ref 30.0–36.0)
MCV: 91.8 fL (ref 80.0–100.0)
Monocytes Absolute: 0.3 10*3/uL (ref 0.1–1.0)
Monocytes Relative: 9 %
Neutro Abs: 1.9 10*3/uL (ref 1.7–7.7)
Neutrophils Relative %: 54 %
Platelets: 160 10*3/uL (ref 150–400)
RBC: 3.79 MIL/uL — ABNORMAL LOW (ref 3.87–5.11)
RDW: 13.3 % (ref 11.5–15.5)
WBC: 3.5 10*3/uL — ABNORMAL LOW (ref 4.0–10.5)
nRBC: 0 % (ref 0.0–0.2)

## 2021-02-21 LAB — TROPONIN I (HIGH SENSITIVITY): Troponin I (High Sensitivity): 3 ng/L (ref <18)

## 2021-02-21 LAB — URINALYSIS, ROUTINE W REFLEX MICROSCOPIC
Bilirubin Urine: NEGATIVE
Glucose, UA: NEGATIVE mg/dL
Hgb urine dipstick: NEGATIVE
Ketones, ur: NEGATIVE mg/dL
Leukocytes,Ua: NEGATIVE
Nitrite: NEGATIVE
Protein, ur: 30 mg/dL — AB
Specific Gravity, Urine: 1.012 (ref 1.005–1.030)
pH: 5 (ref 5.0–8.0)

## 2021-02-21 NOTE — ED Provider Triage Note (Addendum)
Emergency Medicine Provider Triage Evaluation Note  Christine Todd , a 46 y.o. female  was evaluated in triage.  Patient has a history of SLE.  Pt complains of palpitations.  She states that for the past month she has been experiencing left flank pain.  She states that she has had recurrent UTIs and has had urine cultures that are positive for E. coli.  She states that he started having foul-smelling urine about 1 week ago once again.  She was diagnosed with another UTI and started nitrofurantoin 4 days ago.  She states that her urinary symptoms have improved.  She states that she has still been experiencing flank pain since began.  Also notes that she has been experiencing hypertension and has been compliant with her lisinopril.  She states earlier today she began experiencing intermittent palpitations, mild anterior chest pain, as well as headaches.  Physical Exam  BP (!) 169/107 (BP Location: Left Arm)    Pulse 76    Temp 98.8 F (37.1 C) (Oral)    Resp 15    Ht 5\' 7"  (1.702 m)    Wt 81 kg    LMP 01/21/2021    SpO2 100%    BMI 27.97 kg/m  Gen:   Awake, no distress   Resp:  Normal effort  MSK:   Moves extremities without difficulty  Other:    Medical Decision Making  Medically screening exam initiated at 10:21 PM.  Appropriate orders placed.  Mikella Linsley was informed that the remainder of the evaluation will be completed by another provider, this initial triage assessment does not replace that evaluation, and the importance of remaining in the ED until their evaluation is complete.   Rayna Sexton, PA-C 02/21/21 2223    Rayna Sexton, PA-C 02/21/21 2224

## 2021-02-21 NOTE — ED Notes (Signed)
Patient is being discharged from the Urgent Care and sent to the Emergency Department via wheelchair . Per Dr Windy Carina, patient is in need of higher level of care due to heart palpitations & generalized weakness. Patient is aware and verbalizes understanding of plan of care.    Vitals:   02/21/21 2008  BP: (!) 168/103  Pulse: 77  Temp: 98.2 F (36.8 C)  SpO2: 100%

## 2021-02-21 NOTE — ED Triage Notes (Signed)
Patient presents with multiple complaints : Hypertension today BP = 166/101 , left lateral abdominal pain with malodorous urine , currently taking oral antibiotic for UTI , patient added migraine and palpitations .

## 2021-02-22 LAB — I-STAT BETA HCG BLOOD, ED (MC, WL, AP ONLY): I-stat hCG, quantitative: <5 m[IU]/mL (ref <5)

## 2021-02-22 LAB — TROPONIN I (HIGH SENSITIVITY): Troponin I (High Sensitivity): 4 ng/L (ref <18)

## 2021-02-22 MED ORDER — CEPHALEXIN 500 MG PO CAPS: 500.0000 mg | ORAL_CAPSULE | Freq: Two times a day (BID) | ORAL | 0 refills | Status: AC

## 2021-02-22 NOTE — Discharge Instructions (Addendum)
You came to the emergency department today to be evaluated for your left flank pain and high blood pressure.  Your physical exam and lab results were reassuring.  Please continue to take your lisinopril medication as prescribed and record blood pressures daily as we discussed.  Please follow-up closely with your primary care doctor for further management of your high blood pressure.  Due to your left flank pain and current treatment for urinary tract infection there is concern for possible pyelonephritis.  Due to this we have changed your antibiotic regimen.  Please stop taking your Macrobid and start taking the Keflex that you are prescribed.  Your white blood cell count was found to be slightly decreased here in the emergency department.  Please follow-up with your primary care doctor for repeat lab testing.  You may have diarrhea from the antibiotics.  It is very important that you continue to take the antibiotics even if you get diarrhea unless a medical professional tells you that you may stop taking them.  If you stop too early the bacteria you are being treated for will become stronger and you may need different, more powerful antibiotics that have more side effects and worsening diarrhea.  Please stay well hydrated and consider probiotics as they may decrease the severity of your diarrhea.  Please be aware that if you take any hormonal contraception (birth control pills, nexplanon, the ring, etc) that your birth control will not work while you are taking antibiotics and you need to use back up protection as directed on the birth control medication information insert.    Get help right away if you: Develop a severe headache or confusion. Have unusual weakness or numbness. Feel faint. Have severe pain in your chest or abdomen. Vomit repeatedly. Have trouble breathing. You have severe pain in your back or your lower abdomen. You have a fever or chills. You have nausea or vomiting.

## 2021-02-22 NOTE — ED Provider Notes (Signed)
[facility] EMERGENCY DEPARTMENT Provider Note   CSN: [id number] Arrival date & time: 02/21/21  2033     History  Chief Complaint  Patient presents with   Hypertension / UTI / Abdominal Pain     Christine Todd is a 46 y.o. female with a past medical history of systemic lupus erythematous (on Plaquenil and Myfortic), lupus nephritis, hypertension (on lisinopril 20 mg once daily), anemia, GERD.  Patient presents to the emergency department with a chief complaint of hypertension and left flank pain.  Patient states that she has been dealing with left flank pain intermittently since she was diagnosed with a urinary tract infection in December.  Patient states that she completed course of Macrobid at that time and states that her urine culture grew out E. coli.  Patient states that over the last few weeks she has been having a strong smell to her urine.  Patient was started on course of Macrobid for possible urinary tract infection 3 days prior.  Patient states that she has been having more frequent left flank pain over the last few days.  Patient denies any pain at present.  Patient denies any dysuria, hematuria, urinary urgency, urinary frequency, vaginal pain, vaginal bleeding, vaginal discharge, fevers, chills, abdominal pain, nausea, vomiting  Patient states that she has a history of hypertension and has been taking her lisinopril medication as prescribed.  Patient states that she sometimes takes it in the morning and sometimes takes it in the evenings due to her work schedule.  Patient states that she went to her rheumatologist recently and her blood pressure was elevated at 160/101.  Patient states that yesterday at approximately 1-2 PM she developed headache and a fluttering in her chest.  With her high blood pressure patient became concerned and came to the emergency department.  Patient states that headache onset was gradual pain progressively worse over time.  Pain was located  throughout her entire head however worse behind her eyes.  Patient denies any associated numbness, weakness, facial asymmetry, dysarthria, visual disturbance, chest pain, shortness of breath.  Patient states that her headache gradually resolved without any medications.  Patient states the fluttering sensation lasted for a few minutes before resolving spontaneously.  Patient denies any recent falls or head injuries.  HPI     Home Medications Prior to Admission medications   Medication Sig Start Date End Date Taking? Authorizing Provider  amLODipine (NORVASC) 10 MG tablet Take 10 mg by mouth daily. 03/30/20   [provider]  Cholecalciferol (DIALYVITE VITAMIN D 5000 PO) Take 10,000 Units by mouth daily.    [provider]  gabapentin (NEURONTIN) 300 MG capsule Take 1 capsule (300 mg total) by mouth 3 (three) times daily for 3 days. Patient not taking: Reported on 04/17/2020 04/04/20 04/07/20  Kayleen Memos, DO  HYDROcodone-acetaminophen (NORCO) 5-325 MG tablet Take 1 tablet by mouth every 4 (four) hours as needed for moderate pain. 04/01/20   Daleen Bo, MD  hydroxychloroquine (PLAQUENIL) 200 MG tablet Take 400 mg by mouth daily. 09/28/17   [provider]  ibuprofen (ADVIL) 200 MG tablet Take 200 mg by mouth as needed (pain).    [provider]  lisinopril (ZESTRIL) 20 MG tablet Take 20 mg by mouth daily. 03/30/20   [provider]  metoCLOPramide (REGLAN) 10 MG tablet Take 1 tablet (10 mg total) by mouth every 6 (six) hours as needed for nausea (nausea/headache). 04/01/20   Muthersbaugh, Jarrett Soho, PA-C  Multiple Vitamin (MULTIVITAMIN ADULT  PO) Take 1 tablet by mouth daily.    [provider]  mycophenolate (MYFORTIC) 360 MG TBEC EC tablet Take 720 mg by mouth 2 (two) times daily. 03/25/20   [provider]  norethindrone (AYGESTIN) 5 MG tablet Take 5-10 mg by mouth daily. 02/07/20   [provider]  ondansetron (ZOFRAN-ODT) 8 MG  disintegrating tablet Take 1 tablet (8 mg total) by mouth every 8 (eight) hours as needed for nausea or vomiting. 04/17/20   Melvenia Beam, MD  potassium chloride SA (KLOR-CON) 20 MEQ tablet Take 20 mEq by mouth as needed (leg cramps). 01/06/19   [provider]  predniSONE (DELTASONE) 5 MG tablet Take 2.5 mg by mouth every morning. 04/13/18   [provider]  rizatriptan (MAXALT-MLT) 10 MG disintegrating tablet Take 1 tablet (10 mg total) by mouth as needed for migraine. May repeat in 2 hours if needed 04/17/20   Melvenia Beam, MD  SUMAtriptan (IMITREX) 100 MG tablet Take 1 tablet (100 mg total) by mouth once as needed for up to 10 days for migraine (No more than twice per week.). May repeat in 2 hours if headache persists or recurs. 04/04/20 04/14/20  Kayleen Memos, DO  zinc gluconate 50 MG tablet Take 50 mg by mouth daily.    [provider]      Allergies    Sulfa antibiotics    Review of Systems   Review of Systems  Constitutional:  Positive for fatigue. Negative for chills and fever.  Eyes:  Negative for visual disturbance.  Respiratory:  Negative for shortness of breath.   Cardiovascular:  Positive for palpitations. Negative for chest pain and leg swelling.  Gastrointestinal:  Negative for abdominal pain, nausea and vomiting.  Genitourinary:  Positive for flank pain. Negative for difficulty urinating, dysuria, frequency, genital sores, hematuria, menstrual problem, pelvic pain, urgency, vaginal bleeding, vaginal discharge and vaginal pain.  Musculoskeletal:  Negative for back pain and neck pain.  Skin:  Negative for color change and rash.  Neurological:  Positive for headaches. Negative for dizziness, tremors, seizures, syncope, facial asymmetry, speech difficulty, weakness, light-headedness and numbness.  Psychiatric/Behavioral:  Negative for confusion.    Physical Exam Updated Vital Signs BP (!) 179/113    Pulse 74    Temp 98.8 F (37.1 C) (Oral)    Resp  16    Ht 5\' 7"  (1.702 m)    Wt 81 kg    LMP 01/21/2021    SpO2 100%    BMI 27.97 kg/m  Physical Exam Vitals and nursing note reviewed.  Constitutional:      General: She is not in acute distress.    Appearance: She is not ill-appearing, toxic-appearing or diaphoretic.  HENT:     Head: Normocephalic.  Eyes:     General: No scleral icterus.       Right eye: No discharge.        Left eye: No discharge.  Cardiovascular:     Rate and Rhythm: Normal rate.     Heart sounds: Normal heart sounds, S1 normal and S2 normal.  Pulmonary:     Effort: Pulmonary effort is normal. No tachypnea, bradypnea or respiratory distress.     Breath sounds: Normal breath sounds. No stridor.  Abdominal:     General: Abdomen is flat. Bowel sounds are normal. There is no distension. There are no signs of injury.     Palpations: Abdomen is soft. There is no mass or pulsatile mass.  Tenderness: There is no abdominal tenderness. There is no right CVA tenderness, left CVA tenderness, guarding or rebound.     Hernia: There is no hernia in the umbilical area or ventral area.  Musculoskeletal:     Cervical back: Normal range of motion and neck supple.  Skin:    General: Skin is warm and dry.  Neurological:     General: No focal deficit present.     Mental Status: She is alert.     GCS: GCS eye subscore is 4. GCS verbal subscore is 5. GCS motor subscore is 6.     Cranial Nerves: Cranial nerves 2-12 are intact. No cranial nerve deficit, dysarthria or facial asymmetry.     Sensory: Sensation is intact.     Motor: No weakness, tremor, seizure activity or pronator drift.     Coordination: Finger-Nose-Finger Test normal.     Comments: Sensation to light touch grossly intact to bilateral upper and lower extremities.  Patient moves all limbs equally without any difficulty  Psychiatric:        Behavior: Behavior is cooperative.    ED Results / Procedures / Treatments   Labs (all labs ordered are listed, but only  abnormal results are displayed) Labs Reviewed  COMPREHENSIVE METABOLIC PANEL - Abnormal; Notable for the following components:      Result Value   Glucose, Bld 112 (*)    Creatinine, Ser 1.13 (*)    Total Protein 8.4 (*)    All other components within normal limits  CBC WITH DIFFERENTIAL/PLATELET - Abnormal; Notable for the following components:   WBC 3.5 (*)    RBC 3.79 (*)    Hemoglobin 11.1 (*)    HCT 34.8 (*)    All other components within normal limits  URINALYSIS, ROUTINE W REFLEX MICROSCOPIC - Abnormal; Notable for the following components:   Protein, ur 30 (*)    Bacteria, UA RARE (*)    All other components within normal limits  URINE CULTURE  I-STAT BETA HCG BLOOD, ED ([facility], [facility], [facility] ONLY)  TROPONIN I (HIGH SENSITIVITY)  TROPONIN I (HIGH SENSITIVITY)    EKG None  Radiology DG Chest 2 View  Result Date: 02/21/2021 CLINICAL DATA:  Chest pain. EXAM: CHEST - 2 VIEW COMPARISON:  Chest x-ray 08/15/2019. CT chest 05/19/2018. FINDINGS: Heart is mildly enlarged. There are some chronic appearing interstitial opacities in the lung bases, unchanged. The lungs are otherwise clear. No pleural effusion or pneumothorax identified. No acute fractures are seen. IMPRESSION: 1. No acute cardiopulmonary process. 2. Stable chronic interstitial opacities in the lung bases. 3. Stable cardiomegaly. Electronically Signed   By: Ronney Asters M.D.   On: 02/21/2021 22:53    Procedures Procedures    Medications Ordered in ED Medications - No data to display  ED Course/ Medical Decision Making/ A&P                           Medical Decision Making Risk Prescription drug management.   Alert 46 year old female no acute distress, nontoxic-appearing.  Presents to the emergency department with a chief complaint of hypertension and left flank pain.  Patient has past medical history of SLE and hypertension which complicate her care.  Previous medical history was reviewed.  Eluding previous  provider notes, lab results, and urine culture results.  Lab work was ordered and independently reviewed by myself pertinent findings include: -Troponin 3 and 4 with delta of +1 -CMP shows creatinine elevated at  1.13 which appears baseline for the patient -CBC shows anemia which appears baseline for the patient.  Leukopenia noted with white count at 3.5.  No abnormal cell morphology. -Urinalysis shows bacteria rare, leukocyte negative, nitrite negative, RBC 0-5, WBC 0-5.  Hypertension -Patient has history of hypertension and is currently taking lisinopril 20 mg once daily.  Patient endorses compliance with this medication. -Patient states that she had the onset of headache yesterday as well as fluttering in her chest. -Patient noted to be hypertensive in the emergency department with most recent blood pressure 136/90. -Neuro exam is reassuring.  Low suspicion for subarachnoid hemorrhage at this time. -Low suspicion for ACS as negative delta troponins. -Advised patient to continue taking her lisinopril medication and follow-up closely with primary care provider for further management.  Left flank pain -Patient reports that she is currently taking Macrobid for a urinary tract infection. -Patient has no CVA tenderness on exam and is afebrile. -Renal/ureteral calculus was considered due to patient's left flank pain intermittently over the last few weeks.  Shared decision making with patient about obtaining CT renal study to evaluate for possible renal/ureteral calculus.  Patient declined imaging at this time. -Due to current treatment for urinary tract infection with left flank pain concern for pyelonephritis.  Will switch patient's antibiotics to Keflex for better coverage.  Urine culture pending.  Discussed results, findings, treatment and follow up. Patient advised of return precautions. Patient verbalized understanding and agreed with plan.         Final Clinical Impression(s) / ED  Diagnoses Final diagnoses:  Hypertension, unspecified type    Rx / DC Orders ED Discharge Orders     None         Loni Beckwith, PA-C 02/22/21 1814    Fredia Sorrow, MD 02/23/21 [phone]

## 2021-02-23 LAB — URINE CULTURE: Culture: NO GROWTH

## 2021-02-25 ENCOUNTER — Other Ambulatory Visit: Payer: Self-pay

## 2021-02-25 ENCOUNTER — Ambulatory Visit (INDEPENDENT_AMBULATORY_CARE_PROVIDER_SITE_OTHER): Payer: BC Managed Care – PPO

## 2021-02-25 ENCOUNTER — Ambulatory Visit (INDEPENDENT_AMBULATORY_CARE_PROVIDER_SITE_OTHER): Payer: BC Managed Care – PPO | Admitting: Podiatry

## 2021-02-25 DIAGNOSIS — L84 Corns and callosities: Secondary | ICD-10-CM

## 2021-02-25 DIAGNOSIS — M79671 Pain in right foot: Secondary | ICD-10-CM

## 2021-02-25 DIAGNOSIS — G5761 Lesion of plantar nerve, right lower limb: Secondary | ICD-10-CM | POA: Diagnosis not present

## 2021-02-25 DIAGNOSIS — M21611 Bunion of right foot: Secondary | ICD-10-CM

## 2021-02-25 NOTE — Patient Instructions (Signed)
Bunion A bunion (hallux valgus) is a bump that forms slowly on the inner side of the big toe joint. It occurs when the big toe turns toward the second toe. Bunions may be small at first, but they often get larger over time. They can make walking painful. What are the causes? This condition may be caused by: Wearing narrow or pointed shoes that force the big toe to press against the other toes. Abnormal foot development that causes the foot to roll inward. Changes in the foot that are caused by certain diseases, such as rheumatoid arthritis or polio. A foot injury. What increases the risk? The following factors may make you more likely to develop this condition: Wearing shoes that squeeze the toes together. Having certain diseases, such as: Rheumatoid arthritis. Polio. Cerebral palsy. Having family members who have bunions. Being born with abnormally shaped feet (a foot deformity), such as flat feet or low arches. Doing activities that put a lot of pressure on the feet, such as ballet dancing. What are the signs or symptoms? The main symptom of this condition is a bump on your big toe that you can notice. Other symptoms may include: Pain. Redness and inflammation around your big toe. Thick or hardened skin on your big toe or between your toes. Stiffness or loss of motion in your big toe. Trouble with walking. How is this diagnosed? This condition may be diagnosed based on your symptoms, medical history, and activities. You may also have tests and imaging, such as: X-rays. These allow your health care provider to check the position of the bones in your foot and look for damage to your joint. They also help your health care provider determine the severity of your bunion and the best way to treat it. Joint aspiration. In this test, a sample of fluid is removed from the toe joint. This test may be done if you are in a lot of pain. It helps rule out diseases that cause painful swelling of the  joints, such as arthritis or gout. How is this treated? Treatment depends on the severity of your symptoms. The goal of treatment is to relieve symptoms and prevent your bunion from getting worse. Your health care provider may recommend: Wearing shoes that have a wide toe box, or using bunion pads to cushion the affected area. Taping your toes together to keep them in a normal position. Placing a device inside your shoe (orthotic device) to help reduce pressure on your toe joint. Taking medicine to ease pain and inflammation. Putting ice or heat on the affected area. Doing stretching exercises. Surgery, for severe cases. Follow these instructions at home: Managing pain, stiffness, and swelling   If directed, put ice on the painful area. To do this: Put ice in a plastic bag. Place a towel between your skin and the bag. Leave the ice on for 20 minutes, 2-3 times a day. Remove the ice if your skin turns bright red. This is very important. If you cannot feel pain, heat, or cold, you have a greater risk of damage to the area. If directed, apply heat to the affected area before you exercise. Use the heat source that your health care provider recommends, such as a moist heat pack or a heating pad. Place a towel between your skin and the heat source. Leave the heat on for 20-30 minutes. Remove the heat if your skin turns bright red. This is especially important if you are unable to feel pain, heat, or cold. You  have a greater risk of getting burned. General instructions Do exercises as told by your health care provider. Support your toe joint with proper footwear, shoe padding, or taping as told by your health care provider. Take over-the-counter and prescription medicines only as told by your health care provider. Do not use any products that contain nicotine or tobacco, such as cigarettes, e-cigarettes, and chewing tobacco. If you need help quitting, ask your health care provider. Keep all  follow-up visits. This is important. Contact a health care provider if: Your symptoms get worse. Your symptoms do not improve in 2 weeks. Get help right away if: You have severe pain and trouble with walking. Summary A bunion is a bump on the inner side of the big toe joint that forms when the big toe turns toward the second toe. Bunions can make walking painful. Treatment depends on the severity of your symptoms. Support your toe joint with proper footwear, shoe padding, or taping as told by your health care provider. This information is not intended to replace advice given to you by your health care provider. Make sure you discuss any questions you have with your health care provider. Document Revised: 05/26/2019 Document Reviewed: 05/26/2019 Elsevier Patient Education  2022 [place] Neuralgia [place] Lathe neuralgia is foot pain that affects the ball of the foot and the area near the toes. Morton neuralgia occurs when part of a nerve in the foot (digital nerve) is under too much pressure (compressed). When this happens over a long period of time, the nerve can thicken (neuroma) and cause pain. Pain usually occurs between the third and fourth toes.  Morton neuralgia can come and go but may get worse over time. What are the causes? This condition is caused by doing the same things over and over with your foot, such as: Activities such as running or jumping. Wearing shoes that are too tight. What increases the risk? You may be at higher risk for Morton neuralgia if you: Are female. Wear high heels. Wear shoes that are narrow or tight. Do activities that repeatedly stretch your toes, such as: Running. Ballet. Long-distance walking. What are the signs or symptoms? The first symptom of Morton neuralgia is pain that spreads from the ball of the foot to the toes. It may feel like you are walking on a marble. Pain usually gets worse with walking and goes away at night. Other  symptoms may include numbness and cramping of your toes. Both feet are equally affected, but rarely at the same time. How is this diagnosed? This condition is diagnosed based on your symptoms, your medical history, and a physical exam. Your health care provider may: Squeeze your foot just behind your toe. Ask you to move your toes to check for pain. Ask about your physical activity level. You also may have imaging tests, such as an X-ray, ultrasound, or MRI. How is this treated? Treatment depends on how severe your condition is and what causes it. Treatment may involve: Wearing different shoes that are not too tight, are low-heeled, and provide good support. For some people, this is the only treatment needed. Wearing an over-the-counter or custom supportive pad (orthotic) under the front of your foot. Getting injections of numbing medicine and anti-inflammatory medicine (steroid) in the nerve. Having surgery to remove part of the thickened nerve. Follow these instructions at home: Managing pain, stiffness, and swelling  Massage your foot as needed. Wear orthotics as told by your health care provider. If directed, put  ice on the painful area. To do this: Put ice in a plastic bag. Place a towel between your skin and the bag. Leave the ice on for 20 minutes, 2-3 times a day. Remove the ice if your skin turns bright red. This is very important. If you cannot feel pain, heat, or cold, you have a greater risk of damage to the area. Raise (elevate) the injured area above the level of your heart while you are sitting or lying down. Avoid activities that cause pain or make pain worse. If you play sports, ask your health care provider when it is safe for you to return to sports. General instructions Take over-the-counter and prescription medicines only as told by your health care provider. For the time period you were told by your health care provider, do not drive or use machinery. Wear shoes  that: Have soft soles. Have a wide toe area. Provide arch support. Do not pinch or squeeze your feet. Have room for your orthotics, if this applies. Keep all follow-up visits. This is important. Contact a health care provider if: Your symptoms get worse or do not get better with treatment and home care. Summary Morton neuralgia is foot pain that affects the ball of the foot and the area near the toes. Pain usually occurs between the third and fourth toes, gets worse with walking, and goes away at night. Morton neuralgia occurs when part of a nerve in the foot (digital nerve) is under too much pressure. When this happens over a long period of time, the nerve can thicken (neuroma) and cause pain. This condition is caused by doing the same things over and over with your foot, such as running or jumping, wearing shoes that are too tight, or wearing high heels. Treatment may involve wearing low-heeled shoes that are not too tight, wearing a supportive pad (orthotic) under the front of your foot, getting injections in the nerve, or having surgery to remove part of the thickened nerve. This information is not intended to replace advice given to you by your health care provider. Make sure you discuss any questions you have with your health care provider. Document Revised: 06/28/2020 Document Reviewed: 06/28/2020 Elsevier Patient Education  [place].

## 2021-03-01 ENCOUNTER — Other Ambulatory Visit: Payer: Self-pay

## 2021-03-01 ENCOUNTER — Ambulatory Visit (HOSPITAL_COMMUNITY)
Admission: EM | Admit: 2021-03-01 | Discharge: 2021-03-01 | Disposition: A | Discharge status: Home / Self Care | Payer: BC Managed Care – PPO | Attending: Emergency Medicine | Admitting: Emergency Medicine

## 2021-03-01 ENCOUNTER — Encounter (HOSPITAL_COMMUNITY): Payer: Self-pay | Admitting: *Deleted

## 2021-03-01 DIAGNOSIS — R509 Fever, unspecified: Secondary | ICD-10-CM | POA: Insufficient documentation

## 2021-03-01 DIAGNOSIS — R1084 Generalized abdominal pain: Secondary | ICD-10-CM

## 2021-03-01 DIAGNOSIS — Z1152 Encounter for screening for COVID-19: Secondary | ICD-10-CM | POA: Diagnosis present

## 2021-03-01 LAB — POC INFLUENZA A AND B ANTIGEN (URGENT CARE ONLY)
INFLUENZA A ANTIGEN, POC: NEGATIVE
INFLUENZA B ANTIGEN, POC: NEGATIVE

## 2021-03-01 NOTE — ED Triage Notes (Signed)
Pt reports fever and ABD.pain since yesterday. Pt reports Co-workers have been sick.

## 2021-03-01 NOTE — ED Provider Notes (Signed)
[place]   [id number] 03/01/21 Arrival Time: 1215  Chief Complaint  Patient presents with   Fever   Abdominal Pain     SUBJECTIVE:  Christine Todd is a 46 y.o. female who presented to the urgent care for complaint of fever and abdominal pain that started yesterday.  Reports she is currently start keflex for UTI 2 days ago.  Denies a precipitating event, trauma, close contacts with similar symptoms, recent travel.  Localizes pain to generalized abdomen.  Describes as achy and worsening in character.  Has not tried any OTC medications.  Denies alleviating or aggravating factors.  Denies similar symptoms in the past.  Denies, appetite changes, weight changes, nausea, vomiting, chest pain, SOB, diarrhea, constipation, difficulty urinating,and  pelvic pain.     Patient's last menstrual period was 02/25/2021.  ROS: As per HPI.  All other pertinent ROS negative.      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   Current Outpatient Medications on File Prior to Encounter  Medication Sig Dispense Refill   amLODipine (NORVASC) 10 MG tablet Take 10 mg by mouth daily.     cephALEXin (KEFLEX) 500 MG capsule Take 1 capsule (500 mg total) by mouth 2 (two) times daily for 10 days. 20 capsule 0   Cholecalciferol (DIALYVITE VITAMIN D 5000 PO) Take 10,000 Units by mouth daily.     gabapentin (NEURONTIN) 300  MG capsule Take 1 capsule (300 mg total) by mouth 3 (three) times daily for 3 days. (Patient not taking: Reported on 04/17/2020) 9 capsule 0   HYDROcodone-acetaminophen (NORCO) 5-325 MG tablet Take 1 tablet by mouth every 4 (four) hours as needed for moderate pain. 20 tablet 0   hydroxychloroquine (PLAQUENIL) 200 MG tablet Take 400 mg by mouth daily.     ibuprofen (ADVIL) 200 MG tablet Take 200 mg by mouth as needed (pain).     lisinopril (ZESTRIL) 20 MG tablet Take 20 mg by mouth daily.     metoCLOPramide (REGLAN) 10 MG tablet Take 1 tablet (10 mg total) by mouth every 6 (six) hours as needed for nausea (nausea/headache). 6 tablet 0   Multiple Vitamin (MULTIVITAMIN ADULT PO) Take 1 tablet by mouth daily.     mycophenolate (MYFORTIC) 360 MG TBEC EC tablet Take 720 mg by mouth 2 (two) times daily.     norethindrone (AYGESTIN) 5 MG tablet Take 5-10 mg by mouth daily.     ondansetron (ZOFRAN-ODT) 8 MG disintegrating tablet Take 1 tablet (8 mg total) by mouth every 8 (eight) hours as needed for nausea or vomiting. 20 tablet 0   potassium chloride SA (KLOR-CON) 20 MEQ tablet Take 20 mEq by mouth as needed (leg cramps).     predniSONE (DELTASONE) 5 MG tablet Take 2.5 mg by mouth every morning.     rizatriptan (MAXALT-MLT) 10 MG  disintegrating tablet Take 1 tablet (10 mg total) by mouth as needed for migraine. May repeat in 2 hours if needed 9 tablet 11   SUMAtriptan (IMITREX) 100 MG tablet Take 1 tablet (100 mg total) by mouth once as needed for up to 10 days for migraine (No more than twice per week.). May repeat in 2 hours if headache persists or recurs. 10 tablet 0   zinc gluconate 50 MG tablet Take 50 mg by mouth daily.     Social History   Socioeconomic History   Marital status: Single    Spouse name: Not on file   Number of children: Not on file   Years of education: Not on file   Highest education level: Not on file  Occupational History   Occupation: Scientist, water quality  Tobacco Use   Smoking status:  Never   Smokeless tobacco: Never  Vaping Use   Vaping Use: Never used  Substance and Sexual Activity   Alcohol use: Yes    Comment: rarely    Drug use: No   Sexual activity: Yes    Birth control/protection: None  Other Topics Concern   Not on file  Social History Narrative   Lives alone   Left handed   Caffeine: rarely   Social Determinants of Health   Financial Resource Strain: Not on file  Food Insecurity: Not on file  Transportation Needs: Not on file  Physical Activity: Not on file  Stress: Not on file  Social Connections: Not on file  Intimate Partner Violence: Not on file   Family History  Problem Relation Age of Onset   Diabetes Other    Cancer Other    Hypertension Other    Diabetes Father    Migraines Sister      OBJECTIVE:  Vitals:   03/01/21 1414  BP: 124/83  Pulse: 96  Resp: 18  Temp: 99.8 F (37.7 C)  SpO2: 100%    Physical Exam Vitals and nursing note reviewed.  Constitutional:      General: She is not in acute distress.    Appearance: Normal appearance. She is normal weight. She is not ill-appearing, toxic-appearing or diaphoretic.  HENT:     Head: Normocephalic.  Cardiovascular:     Rate and Rhythm: Normal rate and regular rhythm.     Pulses: Normal pulses.     Heart sounds: Normal heart sounds. No murmur heard.   No friction rub. No gallop.  Pulmonary:     Effort: Pulmonary effort is normal. No respiratory distress.     Breath sounds: Normal breath sounds. No stridor. No wheezing, rhonchi or rales.  Chest:     Chest wall: No tenderness.  Abdominal:     General: Bowel sounds are normal.     Palpations: Abdomen is soft.     Tenderness: There is generalized abdominal tenderness. Negative signs include McBurney's sign.  Neurological:     Mental Status: She is alert and oriented to person, place, and time.     LABS: Results for orders placed or performed during the hospital encounter of 03/01/21 (from the past 24 hour(s))  POC  Influenza A & B Ag (Urgent Care)     Status: None   Collection Time: 03/01/21  2:42 PM  Result Value Ref Range   INFLUENZA A ANTIGEN, POC NEGATIVE NEGATIVE   INFLUENZA B ANTIGEN, POC NEGATIVE NEGATIVE    DIAGNOSTIC STUDIES: No results found.   ASSESSMENT & PLAN:  1. Generalized abdominal pain   2. Encounter for  screening for COVID-19   3. Fever, unknown origin     No orders of the defined types were placed in this encounter.  Patient stable at discharge.  Abdominal pain is more likely related to her antibiotic use.Marland Kitchen  She was advised to follow-up with PCP for further evaluation  Discharge instructions  Influenza test is negative  COVID testing ordered.  It will take between 1-4 days for test results.  Someone will contact you regarding abnormal results.    Get plenty of rest and push fluids May take Tylenol every 6 hours as needed for fever Use medications daily for symptom relief Follow-up with PCP Use OTC medications like ibuprofen or tylenol as needed fever or pain Call or go to the ED if you have any new or worsening symptoms such as fever, worsening cough, shortness of breath, chest tightness, chest pain, turning blue, changes in mental status, etc...     Reviewed expectations re: course of current medical issues. Questions answered. Outlined signs and symptoms indicating need for more acute intervention. Patient verbalized understanding. After Visit Summary given.    Emerson Monte, [place] 03/01/21 1457

## 2021-03-01 NOTE — Discharge Instructions (Addendum)
COVID testing ordered.  It will take between 1-4 days for test results.  Someone will contact you regarding abnormal results.    Get plenty of rest and push fluids May take Tylenol every 6 hours as needed for fever Use medications daily for symptom relief Follow-up with PCP Use OTC medications like ibuprofen or tylenol as needed fever or pain Call or go to the ED if you have any new or worsening symptoms such as fever, worsening cough, shortness of breath, chest tightness, chest pain, turning blue, changes in mental status, etc..Marland Kitchen

## 2021-03-02 LAB — SARS CORONAVIRUS 2 (TAT 6-24 HRS): SARS Coronavirus 2: NEGATIVE

## 2021-03-03 NOTE — Progress Notes (Signed)
Subjective:   Patient ID: Christine Todd, female   DOB: 46 y.o.   MRN: [id number]   HPI 46 year old female presents the office today for concerns of pain to her right foot.  She states she gets some pain, numbness and tingling into the second, third, fourth toes.  She also gets a callus between her fourth and fifth toe.  She states that walking around during the day makes it hurt more for both issues.  Denies any recent injury.  No recent treatment.  She has no other concerns.   Review of Systems  All other systems reviewed and are negative.  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"   SLE (systemic lupus erythematosus) ([place])     Past Surgical History:  Procedure Laterality Date   DILATION AND CURETTAGE OF UTERUS  2009   RENAL BIOPSY, PERCUTANEOUS  ?2013     Current Outpatient Medications:    amLODipine (NORVASC) 10 MG tablet, Take 10 mg by mouth daily., Disp: , Rfl:    cephALEXin (KEFLEX) 500 MG capsule, Take 1 capsule (500 mg total) by mouth 2 (two) times daily for 10 days., Disp: 20 capsule, Rfl: 0   Cholecalciferol (DIALYVITE VITAMIN D 5000 PO), Take 10,000 Units by mouth daily., Disp: , Rfl:    gabapentin (NEURONTIN) 300 MG capsule, Take 1 capsule (300 mg total) by mouth 3 (three) times daily for 3 days. (Patient not taking: Reported on 04/17/2020), Disp: 9 capsule, Rfl: 0   HYDROcodone-acetaminophen (NORCO) 5-325 MG tablet, Take 1 tablet by mouth every 4 (four) hours as needed for moderate pain., Disp: 20 tablet, Rfl: 0   hydroxychloroquine (PLAQUENIL) 200 MG tablet, Take 400 mg by mouth daily., Disp: , Rfl:    ibuprofen (ADVIL) 200 MG tablet, Take 200 mg by mouth as needed (pain)., Disp: , Rfl:    lisinopril  (ZESTRIL) 20 MG tablet, Take 20 mg by mouth daily., Disp: , Rfl:    metoCLOPramide (REGLAN) 10 MG tablet, Take 1 tablet (10 mg total) by mouth every 6 (six) hours as needed for nausea (nausea/headache)., Disp: 6 tablet, Rfl: 0   Multiple Vitamin (MULTIVITAMIN ADULT PO), Take 1 tablet by mouth daily., Disp: , Rfl:    mycophenolate (MYFORTIC) 360 MG TBEC EC tablet, Take 720 mg by mouth 2 (two) times daily., Disp: , Rfl:    norethindrone (AYGESTIN) 5 MG tablet, Take 5-10 mg by mouth daily., Disp: , Rfl:    ondansetron (ZOFRAN-ODT) 8 MG disintegrating tablet, Take 1 tablet (8 mg total) by mouth every 8 (eight) hours as needed for nausea or vomiting., Disp: 20 tablet, Rfl: 0   potassium chloride SA (KLOR-CON) 20 MEQ tablet, Take 20 mEq by mouth as needed (leg cramps)., Disp: , Rfl:    predniSONE (DELTASONE) 5 MG tablet, Take 2.5 mg by mouth every morning., Disp: , Rfl:    rizatriptan (MAXALT-MLT) 10 MG disintegrating tablet, Take 1 tablet (10 mg total) by mouth as needed for migraine. May repeat in 2 hours if needed, Disp: 9 tablet, Rfl: 11   SUMAtriptan (IMITREX) 100 MG tablet, Take 1 tablet (100 mg total) by mouth once as needed for up to 10 days  for migraine (No more than twice per week.). May repeat in 2 hours if headache persists or recurs., Disp: 10 tablet, Rfl: 0   zinc gluconate 50 MG tablet, Take 50 mg by mouth daily., Disp: , Rfl:   Allergies  Allergen Reactions   Sulfa Antibiotics Itching and Rash          Objective:  Physical Exam  General: AAO x3, NAD  Dermatological: Mild hyperkeratotic tissue present in the fourth toe of the right foot on the lateral aspect without any underlying ulceration drainage or any signs of infection.  Vascular: Dorsalis Pedis artery and Posterior Tibial artery pedal pulses are 2/4 bilateral with immedate capillary fill time.  There is no pain with calf compression, swelling, warmth, erythema.   Neruologic: Grossly intact via light touch bilateral.    Musculoskeletal: Tenderness palpation on second interspace and a small palpable neuroma identified.  No area of pinpoint tenderness.  Severe bunion is present.  Muscular strength 5/5 in all groups tested bilateral.  Gait: Unassisted, Nonantalgic.       Assessment:   Neuroma right second interspace, skin lesion right fourth toe, bunion     Plan:  -Treatment options discussed including all alternatives, risks, and complications -Etiology of symptoms were discussed -X-rays were obtained and reviewed with the patient.  Severe bunions present.  There is no evidence of acute fracture noted today. -Discussed steroid injection today.  We discussed shoe modifications to avoid excess pressure to the area.  Dispensed metatarsal pads as well. Hervey Ard reviewed the hyperkeratotic lesion on the fourth toe without any complications or bleeding.  Dispensed toe separator. -Ultimately given her bunion as well as her pain may need to consider surgical intervention if symptoms continue.  Return if symptoms worsen or fail to improve.  Trula Slade DPM

## 2021-03-04 ENCOUNTER — Encounter: Payer: Self-pay | Admitting: Podiatry

## 2021-03-06 ENCOUNTER — Emergency Department (HOSPITAL_BASED_OUTPATIENT_CLINIC_OR_DEPARTMENT_OTHER)
Admission: EM | Admit: 2021-03-06 | Discharge: 2021-03-06 | Disposition: A | Discharge status: Home / Self Care | Payer: BC Managed Care – PPO | Attending: Emergency Medicine | Admitting: Emergency Medicine

## 2021-03-06 ENCOUNTER — Other Ambulatory Visit: Payer: Self-pay

## 2021-03-06 ENCOUNTER — Emergency Department (HOSPITAL_BASED_OUTPATIENT_CLINIC_OR_DEPARTMENT_OTHER): Payer: BC Managed Care – PPO

## 2021-03-06 ENCOUNTER — Encounter (HOSPITAL_BASED_OUTPATIENT_CLINIC_OR_DEPARTMENT_OTHER): Payer: Self-pay | Admitting: *Deleted

## 2021-03-06 DIAGNOSIS — R109 Unspecified abdominal pain: Secondary | ICD-10-CM | POA: Diagnosis present

## 2021-03-06 DIAGNOSIS — Z79899 Other long term (current) drug therapy: Secondary | ICD-10-CM | POA: Insufficient documentation

## 2021-03-06 DIAGNOSIS — N12 Tubulo-interstitial nephritis, not specified as acute or chronic: Secondary | ICD-10-CM | POA: Insufficient documentation

## 2021-03-06 DIAGNOSIS — D219 Benign neoplasm of connective and other soft tissue, unspecified: Secondary | ICD-10-CM

## 2021-03-06 DIAGNOSIS — D259 Leiomyoma of uterus, unspecified: Secondary | ICD-10-CM | POA: Insufficient documentation

## 2021-03-06 LAB — CBC WITH DIFFERENTIAL/PLATELET
Abs Immature Granulocytes: 0.05 10*3/uL (ref 0.00–0.07)
Basophils Absolute: 0 10*3/uL (ref 0.0–0.1)
Basophils Relative: 0 %
Eosinophils Absolute: 0 10*3/uL (ref 0.0–0.5)
Eosinophils Relative: 0 %
HCT: 33.1 % — ABNORMAL LOW (ref 36.0–46.0)
Hemoglobin: 10.7 g/dL — ABNORMAL LOW (ref 12.0–15.0)
Immature Granulocytes: 1 %
Lymphocytes Relative: 11 %
Lymphs Abs: 1.1 10*3/uL (ref 0.7–4.0)
MCH: 28.8 pg (ref 26.0–34.0)
MCHC: 32.3 g/dL (ref 30.0–36.0)
MCV: 89.2 fL (ref 80.0–100.0)
Monocytes Absolute: 0.9 10*3/uL (ref 0.1–1.0)
Monocytes Relative: 10 %
Neutro Abs: 7.6 10*3/uL (ref 1.7–7.7)
Neutrophils Relative %: 78 %
Platelets: 159 10*3/uL (ref 150–400)
RBC: 3.71 MIL/uL — ABNORMAL LOW (ref 3.87–5.11)
RDW: 13.2 % (ref 11.5–15.5)
WBC: 9.7 10*3/uL (ref 4.0–10.5)
nRBC: 0 % (ref 0.0–0.2)

## 2021-03-06 LAB — BASIC METABOLIC PANEL
Anion gap: 11 (ref 5–15)
BUN: 17 mg/dL (ref 6–20)
CO2: 20 mmol/L — ABNORMAL LOW (ref 22–32)
Calcium: 8.8 mg/dL — ABNORMAL LOW (ref 8.9–10.3)
Chloride: 101 mmol/L (ref 98–111)
Creatinine, Ser: 1.53 mg/dL — ABNORMAL HIGH (ref 0.44–1.00)
GFR, Estimated: 43 mL/min — ABNORMAL LOW (ref >60)
Glucose, Bld: 82 mg/dL (ref 70–99)
Potassium: 4.2 mmol/L (ref 3.5–5.1)
Sodium: 132 mmol/L — ABNORMAL LOW (ref 135–145)

## 2021-03-06 LAB — URINALYSIS, MICROSCOPIC (REFLEX)

## 2021-03-06 LAB — URINALYSIS, ROUTINE W REFLEX MICROSCOPIC
Bilirubin Urine: NEGATIVE
Glucose, UA: NEGATIVE mg/dL
Ketones, ur: 15 mg/dL — AB
Nitrite: POSITIVE — AB
Protein, ur: 100 mg/dL — AB
Specific Gravity, Urine: 1.025 (ref 1.005–1.030)
pH: 5.5 (ref 5.0–8.0)

## 2021-03-06 LAB — PREGNANCY, URINE: Preg Test, Ur: NEGATIVE

## 2021-03-06 MED ORDER — CEPHALEXIN 500 MG PO CAPS: 500.0000 mg | ORAL_CAPSULE | Freq: Three times a day (TID) | ORAL | 0 refills | Status: AC

## 2021-03-06 MED ORDER — IOHEXOL 300 MG/ML  SOLN
80.0000 mL | Freq: Once | INTRAMUSCULAR | Status: AC | PRN
  Administered 2021-03-06: 80 mL via INTRAVENOUS

## 2021-03-06 MED ORDER — SODIUM CHLORIDE 0.9 % IV BOLUS
1000.0000 mL | Freq: Once | INTRAVENOUS | Status: AC
  Administered 2021-03-06: 1000 mL via INTRAVENOUS

## 2021-03-06 MED ORDER — HYDROCODONE-ACETAMINOPHEN 5-325 MG PO TABS
1.0000 | ORAL_TABLET | Freq: Once | ORAL | Status: AC
Start: 2021-03-06 — End: 2021-03-06
  Administered 2021-03-06: 1 via ORAL
  Filled 2021-03-06: qty 1

## 2021-03-06 MED ORDER — SODIUM CHLORIDE 0.9 % IV SOLN
1.0000 g | Freq: Once | INTRAVENOUS | Status: AC
  Administered 2021-03-06: 1 g via INTRAVENOUS
  Filled 2021-03-06: qty 10

## 2021-03-06 MED ORDER — ACETAMINOPHEN 500 MG PO TABS: 500.0000 mg | ORAL_TABLET | Freq: Four times a day (QID) | ORAL | 0 refills | Status: AC | PRN

## 2021-03-06 NOTE — ED Notes (Signed)
Lab notified to add-on urine pregnancy test to previously collected urine sample.

## 2021-03-06 NOTE — ED Triage Notes (Signed)
Fever yesterday. She took fever reducer this am. She was tested for Covid and Flu last week and test were negative.

## 2021-03-06 NOTE — Discharge Instructions (Addendum)
The CT scan reveals that you have fibroids that have gotten larger than previous CT scan.  They could be the cause for your pain.  Please follow-up with your primary care doctor for further evaluation and referral to gynecologist.  For your fevers and abnormal urine, we are treating you with antibiotics for 10 days.  Please complete the course of antibiotics even if you start feeling better.

## 2021-03-06 NOTE — ED Provider Notes (Signed)
[facility] Provider Note   CSN: [id number] Arrival date & time: 03/06/21  1840     History  Chief Complaint  Patient presents with   Fever    Christine Todd is a 46 y.o. female.  HPI    46 year old female comes in with chief complaint of fever.  She indicates that she has been having fever over the last 2 or 3 days she had COVID-19 test which was negative.  She has been having some abdominal pain as well.  She saw urgent care and was advised to come to the ER for CT scan.  The patient has history of lupus and is on medications for it.   Home Medications Prior to Admission medications   Medication Sig Start Date End Date Taking? Authorizing Provider  acetaminophen (TYLENOL) 500 MG tablet Take 1 tablet (500 mg total) by mouth every 6 (six) hours as needed. 03/06/21  Yes Varney Biles, MD  cephALEXin (KEFLEX) 500 MG capsule Take 1 capsule (500 mg total) by mouth 3 (three) times daily. 03/06/21  Yes Quinn Quam, MD  amLODipine (NORVASC) 10 MG tablet Take 10 mg by mouth daily. 03/30/20   [provider]  Cholecalciferol (DIALYVITE VITAMIN D 5000 PO) Take 10,000 Units by mouth daily.    [provider]  gabapentin (NEURONTIN) 300 MG capsule Take 1 capsule (300 mg total) by mouth 3 (three) times daily for 3 days. Patient not taking: Reported on 04/17/2020 04/04/20 04/07/20  Kayleen Memos, DO  HYDROcodone-acetaminophen (NORCO) 5-325 MG tablet Take 1 tablet by mouth every 4 (four) hours as needed for moderate pain. 04/01/20   Daleen Bo, MD  hydroxychloroquine (PLAQUENIL) 200 MG tablet Take 400 mg by mouth daily. 09/28/17   [provider]  ibuprofen (ADVIL) 200 MG tablet Take 200 mg by mouth as needed (pain).    [provider]  lisinopril (ZESTRIL) 20 MG tablet Take 20 mg by mouth daily. 03/30/20   [provider]  metoCLOPramide (REGLAN) 10 MG tablet Take 1 tablet (10 mg total) by mouth every 6 (six) hours as  needed for nausea (nausea/headache). 04/01/20   Muthersbaugh, Jarrett Soho, PA-C  Multiple Vitamin (MULTIVITAMIN ADULT PO) Take 1 tablet by mouth daily.    [provider]  mycophenolate (MYFORTIC) 360 MG TBEC EC tablet Take 720 mg by mouth 2 (two) times daily. 03/25/20   [provider]  norethindrone (AYGESTIN) 5 MG tablet Take 5-10 mg by mouth daily. 02/07/20   [provider]  ondansetron (ZOFRAN-ODT) 8 MG disintegrating tablet Take 1 tablet (8 mg total) by mouth every 8 (eight) hours as needed for nausea or vomiting. 04/17/20   Melvenia Beam, MD  potassium chloride SA (KLOR-CON) 20 MEQ tablet Take 20 mEq by mouth as needed (leg cramps). 01/06/19   [provider]  predniSONE (DELTASONE) 5 MG tablet Take 2.5 mg by mouth every morning. 04/13/18   [provider]  rizatriptan (MAXALT-MLT) 10 MG disintegrating tablet Take 1 tablet (10 mg total) by mouth as needed for migraine. May repeat in 2 hours if needed 04/17/20   Melvenia Beam, MD  SUMAtriptan (IMITREX) 100 MG tablet Take 1 tablet (100 mg total) by mouth once as needed for up to 10 days for migraine (No more than twice per week.). May repeat in 2 hours if headache persists or recurs. 04/04/20 04/14/20  Kayleen Memos, DO  zinc gluconate 50 MG tablet Take 50 mg by mouth daily.    [provider]      Allergies    Sulfa antibiotics    Review of Systems   Review of Systems  Constitutional:  Positive for activity change.  Gastrointestinal:  Positive for abdominal pain.  All other systems reviewed and are negative.  Physical Exam Updated Vital Signs BP 109/76    Pulse 94    Temp 98.9 F (37.2 C) (Oral)    Resp 19    Ht 5' 7.5" (1.715 m)    Wt 81 kg    LMP 02/25/2021    SpO2 100%    BMI 27.56 kg/m  Physical Exam Vitals and nursing note reviewed.  Constitutional:      Appearance: She is well-developed.  HENT:     Head: Atraumatic.  Cardiovascular:     Rate and Rhythm: Normal rate.   Pulmonary:     Effort: Pulmonary effort is normal.  Abdominal:     General: There is no distension.     Tenderness: There is abdominal tenderness. There is no guarding or rebound.  Musculoskeletal:     Cervical back: Normal range of motion and neck supple.  Skin:    General: Skin is warm and dry.  Neurological:     Mental Status: She is alert and oriented to person, place, and time.    ED Results / Procedures / Treatments   Labs (all labs ordered are listed, but only abnormal results are displayed) Labs Reviewed  URINALYSIS, ROUTINE W REFLEX MICROSCOPIC - Abnormal; Notable for the following components:      Result Value   APPearance CLOUDY (*)    Hgb urine dipstick TRACE (*)    Ketones, ur 15 (*)    Protein, ur 100 (*)    Nitrite POSITIVE (*)    Leukocytes,Ua SMALL (*)    All other components within normal limits  URINALYSIS, MICROSCOPIC (REFLEX) - Abnormal; Notable for the following components:   Bacteria, UA MANY (*)    All other components within normal limits  CBC WITH DIFFERENTIAL/PLATELET - Abnormal; Notable for the following components:   RBC 3.71 (*)    Hemoglobin 10.7 (*)    HCT 33.1 (*)    All other components within normal limits  BASIC METABOLIC PANEL - Abnormal; Notable for the following components:   Sodium 132 (*)    CO2 20 (*)    Creatinine, Ser 1.53 (*)    Calcium 8.8 (*)    GFR, Estimated 43 (*)    All other components within normal limits  PREGNANCY, URINE    EKG None  Radiology CT ABDOMEN PELVIS W CONTRAST  Result Date: 03/06/2021 CLINICAL DATA:  Generalized abdominal pain EXAM: CT ABDOMEN AND PELVIS WITH CONTRAST TECHNIQUE: Multidetector CT imaging of the abdomen and pelvis was performed using the standard protocol following bolus administration of intravenous contrast. RADIATION DOSE REDUCTION: This exam was performed according to the departmental dose-optimization program which includes automated exposure control, adjustment of the mA and/or  kV according to patient size and/or use of iterative reconstruction technique. CONTRAST:  27mL OMNIPAQUE IOHEXOL 300 MG/ML  SOLN COMPARISON:  07/16/2019 FINDINGS: Lower chest: Mild fibrotic changes are noted in the lower lobes bilateral stable from the prior study. Hepatobiliary: No focal liver abnormality is seen. No gallstones, gallbladder wall thickening, or biliary dilatation. Pancreas: Unremarkable. No pancreatic ductal dilatation or surrounding inflammatory changes. Spleen: Normal in size without focal abnormality. Adrenals/Urinary Tract: Adrenal glands are within normal limits. Kidneys demonstrate a normal enhancement pattern bilaterally. Mild cystic changes are noted  in the right kidney. Tiny punctate nonobstructing stones are noted in the lower poles bilaterally. The ureters are within normal limits. The bladder is decompressed. Stomach/Bowel: Colon shows no obstructive or inflammatory changes. The appendix is within normal limits. No obstructive or inflammatory changes of the small bowel are seen. The stomach is unremarkable. Vascular/Lymphatic: No significant vascular findings are present. No enlarged abdominal or pelvic lymph nodes. Stable small left iliac and retroperitoneal lymph nodes are noted similar to that seen on the prior exam. Reproductive: Uterus is significantly enlarged with multiple uterine fibroids identified. These have increased in the interval from the prior exam. The largest of these now measures 10.0 x 7.4 cm in greatest AP and transverse dimensions respectively increased from 8.2 cm in greatest dimension. No discrete adnexal lesion is noted. Other: No abdominal wall hernia or abnormality. No abdominopelvic ascites. Musculoskeletal: No acute or significant osseous findings. IMPRESSION: Multiple uterine fibroids increased in size when compared with the prior exam. Punctate nonobstructing renal calculi bilaterally. Chronic fibrotic changes in the lungs stable from multiple previous  exams. Electronically Signed   By: Inez Catalina M.D.   On: 03/06/2021 21:30    Procedures Procedures    Medications Ordered in ED Medications  sodium chloride 0.9 % bolus 1,000 mL (1,000 mLs Intravenous New Bag/Given 03/06/21 2040)  cefTRIAXone (ROCEPHIN) 1 g in sodium chloride 0.9 % 100 mL IVPB (1 g Intravenous New Bag/Given 03/06/21 2043)  iohexol (OMNIPAQUE) 300 MG/ML solution 80 mL (80 mLs Intravenous Contrast Given 03/06/21 2105)    ED Course/ Medical Decision Making/ A&P Clinical Course as of 03/06/21 2222  Thu Mar 06, 2021  2222 . [AN]  2222 CT ABDOMEN PELVIS W CONTRAST The patient appears reasonably screened and/or stabilized for discharge and I doubt any other medical condition or other [facility] requiring further screening, evaluation, or treatment in the ED at this time prior to discharge.   Results from the ER workup discussed with the patient face to face and all questions answered to the best of my ability. The patient is safe for discharge with strict return precautions.   [AN]    Clinical Course User Index [AN] Varney Biles, MD                           Medical Decision Making 46 year old female with history of lupus comes in with chief complaint of abdominal pain.  She also indicates having fevers for the last 2 or 3 days  Patient has history of lupus and is on Plaquenil and other medications for it. She has history of UTI.  Differential diagnosis includes cystitis, pyelonephritis, perinephric abscess, fibroids, intra-abdominal infection, intra-abdominal abscess  She does not really have UTI-like symptoms.  However patient has nitrite positive urine and indicates that she has had UTI in the past that was asymptomatic and causing fevers.  I am more concerned that she could have pyelonephritis as her pain is both in the front and her back.  Also she is now having systemic symptoms like fevers.  My suspicion for surgical pathology in her abdomen is low.  Pain appears to be  located in the lower quadrants and there is no peritoneal findings.  She has no vaginal discharge or bleeding.  Indicates that she is celibate.  My suspicion is low for concerning pathology, however she indicates that she was sent to the ER specifically for CT scan and would prefer going home knowing that everything looks fine.  In  the setting of her having lupus and being on antimodulators and having fevers, with no urinary symptoms -we will proceed with a CT scan.  If negative we will start her on antibiotics for pyelonephritis.  Problems Addressed: Fibroids: chronic illness or injury with exacerbation, progression, or side effects of treatment Pyelonephritis: acute illness or injury with systemic symptoms  Amount and/or Complexity of Data Reviewed External Data Reviewed: notes.    Details: Previous urine cultures reviewed, patient has grown E. coli that was [employer]: ordered.    Details: Labs independently reviewed.  CBC, metabolic profile and urine analysis were reviewed.  Urine analysis has findings consistent with infection. Radiology: ordered. ECG/medicine tests: ordered.  Risk OTC drugs. Prescription drug management.    Final Clinical Impression(s) / ED Diagnoses Final diagnoses:  Fibroids  Pyelonephritis    Rx / DC Orders ED Discharge Orders          Ordered    cephALEXin (KEFLEX) 500 MG capsule  3 times daily        03/06/21 2216    acetaminophen (TYLENOL) 500 MG tablet  Every 6 hours PRN        03/06/21 2216              Varney Biles, MD 03/06/21 2222

## 2021-03-21 ENCOUNTER — Other Ambulatory Visit: Payer: Self-pay | Admitting: Podiatry

## 2021-03-21 ENCOUNTER — Telehealth: Payer: Self-pay | Admitting: *Deleted

## 2021-03-21 MED ORDER — DICLOFENAC SODIUM 1 % EX GEL: 2.0000 g | Freq: Four times a day (QID) | CUTANEOUS | 2 refills | Status: AC

## 2021-03-21 NOTE — Telephone Encounter (Signed)
I have sent Voltaren gel to the pharmacy for her. I have called the patient as well. If not covered then she can purchase it OTC

## 2021-03-21 NOTE — Telephone Encounter (Signed)
Patient called for a pain medicine prescription for foot.  Spoke with patient and she said that physician has sent a prescription to pharmacy and did contact her.

## 2021-03-22 ENCOUNTER — Telehealth: Payer: BC Managed Care – PPO

## 2021-03-22 ENCOUNTER — Telehealth: Payer: BC Managed Care – PPO | Admitting: Nurse Practitioner

## 2021-03-22 DIAGNOSIS — M546 Pain in thoracic spine: Secondary | ICD-10-CM | POA: Diagnosis not present

## 2021-03-22 MED ORDER — CYCLOBENZAPRINE HCL 5 MG PO TABS: 5.0000 mg | ORAL_TABLET | Freq: Three times a day (TID) | ORAL | 0 refills | Status: AC | PRN

## 2021-03-22 MED ORDER — NAPROXEN 500 MG PO TABS: 500.0000 mg | ORAL_TABLET | Freq: Two times a day (BID) | ORAL | 0 refills | Status: AC

## 2021-03-22 NOTE — Patient Instructions (Signed)

## 2021-03-22 NOTE — Progress Notes (Signed)
Virtual Visit Consent   Christine Todd, you are scheduled for a virtual visit with Mary-Margaret Hassell Done, [place], a [facility] provider, today.     Just as with appointments in the office, your consent must be obtained to participate.  Your consent will be active for this visit and any virtual visit you may have with one of our providers in the next 365 days.     If you have a MyChart account, a copy of this consent can be sent to you electronically.  All virtual visits are billed to your insurance company just like a traditional visit in the office.    As this is a virtual visit, video technology does not allow for your provider to perform a traditional examination.  This may limit your provider's ability to fully assess your condition.  If your provider identifies any concerns that need to be evaluated in person or the need to arrange testing (such as labs, EKG, etc.), we will make arrangements to do so.     Although advances in technology are sophisticated, we cannot ensure that it will always work on either your end or our end.  If the connection with a video visit is poor, the visit may have to be switched to a telephone visit.  With either a video or telephone visit, we are not always able to ensure that we have a secure connection.     I need to obtain your verbal consent now.   Are you willing to proceed with your visit today? YES   Christine Todd has provided verbal consent on 03/22/2021 for a virtual visit (video or telephone).   Mary-Margaret Hassell Done, FNP   Date: 03/22/2021 9:14 AM   Virtual Visit via Video Note   I, Mary-Margaret Hassell Done, connected with Christine Todd ([id number], 1975/09/01) on 03/22/21 at 10:00 AM EST by a video-enabled telemedicine application and verified that I am speaking with the correct person using two identifiers.  Location: Patient: Virtual Visit Location Patient: Home Provider: Virtual Visit Location Provider: Mobile   I discussed the limitations of  evaluation and management by telemedicine and the availability of in person appointments. The patient expressed understanding and agreed to proceed.    History of Present Illness: Christine Todd is a 46 y.o. who identifies as a female who was assigned female at birth, and is being seen today for back pain.  HPI: Back Pain This is a new problem. The current episode started today. The problem occurs constantly. The problem has been waxing and waning since onset. The pain is present in the thoracic spine. The quality of the pain is described as stabbing and shooting. The pain does not radiate. The pain is at a severity of 8/10. The pain is moderate. The pain is The same all the time. Exacerbated by: movement. Associated symptoms include paresis (right arm). Pertinent negatives include no chest pain. She has tried analgesics for the symptoms. The treatment provided mild relief.  She has lupus and pain is similar to what she has on occasion.  Review of Systems  Cardiovascular:  Negative for chest pain.  Musculoskeletal:  Positive for back pain.   Problems:  Patient Active Problem List   Diagnosis Date Noted   Status migrainosus 04/03/2020   Symptomatic anemia 07/04/2014   Benign essential HTN 07/04/2014   Hypokalemia 07/04/2014   FOOT PAIN, LEFT 12/10/2009   KNEE PAIN 11/13/2008   SKIN RASH 11/13/2008   UTI 11/09/2008   Iron deficiency anemia 11/01/2008   MICROSCOPIC  HEMATURIA 11/01/2008   INSOMNIA 07/31/2008   HEADACHE 07/31/2008   ABSCESS, SKIN 04/27/2008   FREQUENCY, URINARY 03/01/2008   OVARIAN CYST, RUPTURED 11/08/2007   GERD 09/24/2007   Lupus (systemic lupus erythematosus) ([place]) 11/25/2006   CANDIDIASIS 11/18/2006   SORE THROAT 11/18/2006   ABNORMAL RESULT, FUNCTION STUDY, LIVER 11/18/2006    Allergies:  Allergies  Allergen Reactions   Sulfa Antibiotics Itching and Rash   Medications:  Current Outpatient Medications:    acetaminophen (TYLENOL) 500 MG tablet, Take 1 tablet  (500 mg total) by mouth every 6 (six) hours as needed., Disp: 30 tablet, Rfl: 0   amLODipine (NORVASC) 10 MG tablet, Take 10 mg by mouth daily., Disp: , Rfl:    cephALEXin (KEFLEX) 500 MG capsule, Take 1 capsule (500 mg total) by mouth 3 (three) times daily., Disp: 30 capsule, Rfl: 0   Cholecalciferol (DIALYVITE VITAMIN D 5000 PO), Take 10,000 Units by mouth daily., Disp: , Rfl:    diclofenac Sodium (VOLTAREN) 1 % GEL, Apply 2 g topically 4 (four) times daily. Rub into affected area of foot 2 to 4 times daily, Disp: 100 g, Rfl: 2   gabapentin (NEURONTIN) 300 MG capsule, Take 1 capsule (300 mg total) by mouth 3 (three) times daily for 3 days. (Patient not taking: Reported on 04/17/2020), Disp: 9 capsule, Rfl: 0   HYDROcodone-acetaminophen (NORCO) 5-325 MG tablet, Take 1 tablet by mouth every 4 (four) hours as needed for moderate pain., Disp: 20 tablet, Rfl: 0   hydroxychloroquine (PLAQUENIL) 200 MG tablet, Take 400 mg by mouth daily., Disp: , Rfl:    ibuprofen (ADVIL) 200 MG tablet, Take 200 mg by mouth as needed (pain)., Disp: , Rfl:    lisinopril (ZESTRIL) 20 MG tablet, Take 20 mg by mouth daily., Disp: , Rfl:    metoCLOPramide (REGLAN) 10 MG tablet, Take 1 tablet (10 mg total) by mouth every 6 (six) hours as needed for nausea (nausea/headache)., Disp: 6 tablet, Rfl: 0   Multiple Vitamin (MULTIVITAMIN ADULT PO), Take 1 tablet by mouth daily., Disp: , Rfl:    mycophenolate (MYFORTIC) 360 MG TBEC EC tablet, Take 720 mg by mouth 2 (two) times daily., Disp: , Rfl:    norethindrone (AYGESTIN) 5 MG tablet, Take 5-10 mg by mouth daily., Disp: , Rfl:    ondansetron (ZOFRAN-ODT) 8 MG disintegrating tablet, Take 1 tablet (8 mg total) by mouth every 8 (eight) hours as needed for nausea or vomiting., Disp: 20 tablet, Rfl: 0   potassium chloride SA (KLOR-CON) 20 MEQ tablet, Take 20 mEq by mouth as needed (leg cramps)., Disp: , Rfl:    predniSONE (DELTASONE) 5 MG tablet, Take 2.5 mg by mouth every morning., Disp:  , Rfl:    rizatriptan (MAXALT-MLT) 10 MG disintegrating tablet, Take 1 tablet (10 mg total) by mouth as needed for migraine. May repeat in 2 hours if needed, Disp: 9 tablet, Rfl: 11   SUMAtriptan (IMITREX) 100 MG tablet, Take 1 tablet (100 mg total) by mouth once as needed for up to 10 days for migraine (No more than twice per week.). May repeat in 2 hours if headache persists or recurs., Disp: 10 tablet, Rfl: 0   zinc gluconate 50 MG tablet, Take 50 mg by mouth daily., Disp: , Rfl:   Observations/Objective: Patient is well-developed, well-nourished in no acute distress.  Resting comfortably  at home.  Head is normocephalic, atraumatic.  No labored breathing.  Speech is clear and coherent with logical content.  Patient is alert and  oriented at baseline.    Assessment and Plan:  Christine Todd in today with chief complaint of No chief complaint on file.   1. Acute right-sided thoracic back pain Moist heat Rest Meds ordered this encounter  Medications   naproxen (NAPROSYN) 500 MG tablet    Sig: Take 1 tablet (500 mg total) by mouth 2 (two) times daily with a meal.    Dispense:  60 tablet    Refill:  0    Order Specific Question:   Supervising Provider    Answer:   Sabra Heck, BRIAN [[id number]]   cyclobenzaprine (FLEXERIL) 5 MG tablet    Sig: Take 1 tablet (5 mg total) by mouth 3 (three) times daily as needed for muscle spasms.    Dispense:  30 tablet    Refill:  0    Order Specific Question:   Supervising Provider    Answer:   Noemi Chapel [[id number]]      Follow Up Instructions: I discussed the assessment and treatment plan with the patient. The patient was provided an opportunity to ask questions and all were answered. The patient agreed with the plan and demonstrated an understanding of the instructions.  A copy of instructions were sent to the patient via MyChart.  The patient was advised to call back or seek an in-person evaluation if the symptoms worsen or if the condition fails to  improve as anticipated.  Time:  I spent 7 minutes with the patient via telehealth technology discussing the above problems/concerns.    Mary-Margaret Hassell Done, FNP

## 2021-08-19 DIAGNOSIS — M3214 Glomerular disease in systemic lupus erythematosus: Secondary | ICD-10-CM | POA: Diagnosis not present

## 2021-08-19 DIAGNOSIS — E559 Vitamin D deficiency, unspecified: Secondary | ICD-10-CM | POA: Diagnosis not present

## 2021-10-08 DIAGNOSIS — R109 Unspecified abdominal pain: Secondary | ICD-10-CM | POA: Diagnosis not present

## 2021-10-08 DIAGNOSIS — N393 Stress incontinence (female) (male): Secondary | ICD-10-CM | POA: Diagnosis not present

## 2021-10-08 DIAGNOSIS — N200 Calculus of kidney: Secondary | ICD-10-CM | POA: Diagnosis not present

## 2021-10-08 DIAGNOSIS — N281 Cyst of kidney, acquired: Secondary | ICD-10-CM | POA: Diagnosis not present

## 2021-10-08 DIAGNOSIS — R1030 Lower abdominal pain, unspecified: Secondary | ICD-10-CM | POA: Diagnosis not present

## 2021-10-08 DIAGNOSIS — R351 Nocturia: Secondary | ICD-10-CM | POA: Diagnosis not present

## 2021-10-08 DIAGNOSIS — R8271 Bacteriuria: Secondary | ICD-10-CM | POA: Diagnosis not present

## 2021-10-28 DIAGNOSIS — J40 Bronchitis, not specified as acute or chronic: Secondary | ICD-10-CM | POA: Diagnosis not present

## 2021-10-28 DIAGNOSIS — J040 Acute laryngitis: Secondary | ICD-10-CM | POA: Diagnosis not present

## 2021-11-14 DIAGNOSIS — M329 Systemic lupus erythematosus, unspecified: Secondary | ICD-10-CM | POA: Diagnosis not present

## 2021-11-14 DIAGNOSIS — R059 Cough, unspecified: Secondary | ICD-10-CM | POA: Diagnosis not present

## 2021-11-14 DIAGNOSIS — Z79899 Other long term (current) drug therapy: Secondary | ICD-10-CM | POA: Diagnosis not present

## 2021-11-17 DIAGNOSIS — R102 Pelvic and perineal pain: Secondary | ICD-10-CM | POA: Diagnosis not present

## 2021-11-17 DIAGNOSIS — N939 Abnormal uterine and vaginal bleeding, unspecified: Secondary | ICD-10-CM | POA: Diagnosis not present

## 2021-11-17 DIAGNOSIS — D219 Benign neoplasm of connective and other soft tissue, unspecified: Secondary | ICD-10-CM | POA: Diagnosis not present

## 2021-11-17 DIAGNOSIS — Z124 Encounter for screening for malignant neoplasm of cervix: Secondary | ICD-10-CM | POA: Diagnosis not present

## 2021-12-29 DIAGNOSIS — M545 Low back pain, unspecified: Secondary | ICD-10-CM | POA: Diagnosis not present

## 2022-01-13 DIAGNOSIS — M3214 Glomerular disease in systemic lupus erythematosus: Secondary | ICD-10-CM | POA: Diagnosis not present

## 2022-01-29 ENCOUNTER — Encounter (HOSPITAL_COMMUNITY): Payer: Self-pay | Admitting: *Deleted

## 2022-01-29 ENCOUNTER — Ambulatory Visit (HOSPITAL_COMMUNITY)
Admission: EM | Admit: 2022-01-29 | Discharge: 2022-01-29 | Disposition: A | Discharge status: Home / Self Care | Payer: BC Managed Care – PPO | Attending: Internal Medicine | Admitting: Internal Medicine

## 2022-01-29 DIAGNOSIS — R030 Elevated blood-pressure reading, without diagnosis of hypertension: Secondary | ICD-10-CM | POA: Insufficient documentation

## 2022-01-29 DIAGNOSIS — I10 Essential (primary) hypertension: Secondary | ICD-10-CM

## 2022-01-29 DIAGNOSIS — J069 Acute upper respiratory infection, unspecified: Secondary | ICD-10-CM

## 2022-01-29 DIAGNOSIS — Z1152 Encounter for screening for COVID-19: Secondary | ICD-10-CM | POA: Insufficient documentation

## 2022-01-29 MED ORDER — BENZONATATE 100 MG PO CAPS: 100.0000 mg | ORAL_CAPSULE | Freq: Three times a day (TID) | ORAL | 0 refills | Status: AC

## 2022-01-29 NOTE — ED Triage Notes (Signed)
Pt states she has been taking her BP meds but it has been elevated 164/112 this morning at home. She has been doubling up her lisinopril due to it being high.    She has been having sore throat, left ear pain since 01/26/2022. She is taking airborne  She states her urine is red/orange in color. She has been taking muscle relaxer for lower back pain without relief.

## 2022-01-29 NOTE — Discharge Instructions (Addendum)
You have a viral upper respiratory infection.  COVID-19 testing is pending. We will call you with results if positive. If your COVID test is positive, you must stay at home until day 6 of symptoms. On day 6, you may go out into public and go back to work, but you must wear a mask until day 11 of symptoms to prevent spread to others.  Purchase mucinex (guaifenesin) '1200mg'$  and take this every 12 hours for the next few days to thin your nasal congestion and mucous so that you are able to get out of your body easier by coughing and blowing your nose. Drink plenty of water while taking this.  Take tessalon pearles every 8 hours as needed for cough.  You may take Tylenol 1000 mg every 6 hours as needed for fever, chills, and aches and pains.  Continue taking lisinopril 20 mg as prescribed.  Do not double doses of this. Your blood pressure is likely elevated due to viral illness and stress. You may continue taking your blood pressure once daily at home and write these numbers down to keep a log of blood pressure.  If your blood pressure remains persistently elevated, please follow-up with your nephrologist for another appointment to discuss this further.  If you develop any new or worsening symptoms, please return.  If your symptoms are severe, please go to the emergency room.  Follow-up with your primary care provider for further evaluation and management of your symptoms as well as ongoing wellness visits.  I hope you feel better!

## 2022-01-29 NOTE — ED Provider Notes (Signed)
[place]    CSN: [id number] Arrival date & time: 01/29/22  1321      History   Chief Complaint Chief Complaint  Patient presents with   Hypertension   Sore Throat   Otalgia    HPI Christine Todd is a 46 y.o. female.   Patient with significant medical history of SLE, HTN, GERD, and anemia presents to urgent care for evaluation of sore throat, bilateral ear pain/pressure, generalized headache, and elevated blood pressure over the last 3 days. Patient has history of hypertension at baseline and currently takes lisinopril '20mg'$  daily. She has been very concerned about elevated BP readings at home since viral URI symptoms started and states BP has been in the 170s/100s since becoming sick. She has been doubling her dose of lisinopril over the last 3 days (taking '40mg'$  daily) in attempt to lower BP. Nephrologist manages BP medications and recently took her off of amlodipine as this was causing hypotension. She denies dizziness, weakness, chest pain, shortness of breath, and heart palpitations. She is experiencing headache to the occipital region without photophobia, vision changes, or phonophobia. Headache is currently a 5 on as scale of 0-10 and has responded well to OTC medications. Reports bilateral ear pain and pressure as well as nasal congestion and left-sided sore throat. No known sick contacts with similar symptoms. No recent antibiotic or steroid use. Denies history of chronic respiratory problems. Has been taking airborne over the counter for symptoms without relief.   Patient mentioned to nursing staff her urine has recently been darker. Denies urinary frequency, urgency, dysuria, and flank pain. Reports low back pain but is taking muscle relaxer for this and attributes this to recent increase in physical activity due to the holidays. No numbness/tingling to the bilateral lower extremities, decreased urinary output, saddle anesthesia symptoms, or injuries/trauma to the low  back/spine.   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"   SLE (systemic lupus erythematosus) ([place])     Patient Active Problem List   Diagnosis Date Noted   Status migrainosus 04/03/2020   Symptomatic anemia 07/04/2014   Benign essential HTN 07/04/2014   Hypokalemia 07/04/2014   FOOT PAIN, LEFT 12/10/2009   KNEE PAIN 11/13/2008   SKIN RASH 11/13/2008   UTI 11/09/2008   Iron deficiency anemia 11/01/2008   MICROSCOPIC HEMATURIA 11/01/2008   INSOMNIA 07/31/2008   HEADACHE 07/31/2008   ABSCESS, SKIN 04/27/2008   FREQUENCY, URINARY 03/01/2008   OVARIAN CYST, RUPTURED 11/08/2007   GERD 09/24/2007   Lupus (systemic lupus erythematosus) ([place]) 11/25/2006   CANDIDIASIS 11/18/2006   SORE THROAT 11/18/2006   ABNORMAL RESULT, FUNCTION STUDY, LIVER 11/18/2006    Past Surgical History:  Procedure Laterality Date   DILATION AND CURETTAGE OF UTERUS  2009   RENAL BIOPSY, PERCUTANEOUS  ?2013    OB History   No obstetric history on file.      Home Medications    Prior to Admission medications   Medication Sig Start Date End Date Taking? Authorizing Provider  acetaminophen (TYLENOL) 500 MG tablet Take 1 tablet (500 mg total) by mouth every 6 (six) hours as needed.  03/06/21  Yes Nanavati, Ankit, MD  amLODipine (NORVASC) 10 MG tablet Take 10 mg by mouth daily. 03/30/20  Yes [provider]  benzonatate (TESSALON) 100 MG capsule Take 1 capsule (100 mg total) by mouth every 8 (eight) hours. 01/29/22  Yes Talbot Grumbling, FNP  cephALEXin (KEFLEX) 500 MG capsule Take 1 capsule (500 mg total) by mouth 3 (three) times daily. 03/06/21  Yes Varney Biles, MD  Cholecalciferol  (DIALYVITE VITAMIN D 5000 PO) Take 10,000 Units by mouth daily.   Yes [provider]  cyclobenzaprine (FLEXERIL) 5 MG tablet Take 1 tablet (5 mg total) by mouth 3 (three) times daily as needed for muscle spasms. 03/22/21  Yes Martin, Mary-Margaret, FNP  diclofenac Sodium (VOLTAREN) 1 % GEL Apply 2 g topically 4 (four) times daily. Rub into affected area of foot 2 to 4 times daily 03/21/21  Yes Trula Slade, DPM  HYDROcodone-acetaminophen (NORCO) 5-325 MG tablet Take 1 tablet by mouth every 4 (four) hours as needed for moderate pain. 04/01/20  Yes Daleen Bo, MD  hydroxychloroquine (PLAQUENIL) 200 MG tablet Take 400 mg by mouth daily. 09/28/17  Yes [provider]  ibuprofen (ADVIL) 200 MG tablet Take 200 mg by mouth as needed (pain).   Yes [provider]  lisinopril (ZESTRIL) 20 MG tablet Take 20 mg by mouth daily. 03/30/20  Yes [provider]  metoCLOPramide (REGLAN) 10 MG tablet Take 1 tablet (10 mg total) by mouth every 6 (six) hours as needed for nausea (nausea/headache). 04/01/20  Yes Muthersbaugh, Jarrett Soho, PA-C  Multiple Vitamin (MULTIVITAMIN ADULT PO) Take 1 tablet by mouth daily.   Yes [provider]  mycophenolate (MYFORTIC) 360 MG TBEC EC tablet Take 720 mg by mouth 2 (two) times daily. 03/25/20  Yes [provider]  naproxen (NAPROSYN) 500 MG tablet Take 1 tablet (500 mg total) by mouth 2 (two) times daily with a meal. 03/22/21  Yes Hassell Done, Mary-Margaret, FNP  ondansetron (ZOFRAN-ODT) 8 MG disintegrating tablet Take 1 tablet (8 mg total) by mouth every 8 (eight) hours as needed for nausea or vomiting. 04/17/20  Yes Melvenia Beam, MD  potassium chloride SA (KLOR-CON) 20 MEQ tablet Take 20 mEq by mouth as needed (leg cramps). 01/06/19  Yes [provider]  predniSONE (DELTASONE) 5 MG tablet Take 2.5 mg by mouth every morning. 04/13/18  Yes [provider]  rizatriptan (MAXALT-MLT) 10 MG disintegrating tablet Take 1  tablet (10 mg total) by mouth as needed for migraine. May repeat in 2 hours if needed 04/17/20  Yes Melvenia Beam, MD  zinc gluconate 50 MG tablet Take 50 mg by mouth daily.   Yes [provider]  gabapentin (NEURONTIN) 300 MG capsule Take 1 capsule (300 mg total) by mouth 3 (three) times daily for 3 days. Patient not taking: Reported on 04/17/2020 04/04/20 04/07/20  Kayleen Memos, DO  norethindrone (AYGESTIN) 5 MG tablet Take 5-10 mg by mouth daily. 02/07/20   [provider]  SUMAtriptan (IMITREX) 100 MG tablet Take 1 tablet (100 mg total) by mouth once as needed for up to 10 days for migraine (No more than twice per week.). May repeat in 2 hours if headache persists or recurs. 04/04/20 04/14/20  Kayleen Memos, DO    Family History Family History  Problem Relation Age of Onset   Diabetes Other    Cancer Other    Hypertension Other    Diabetes Father    Migraines Sister  Social History Social History   Tobacco Use   Smoking status: Never   Smokeless tobacco: Never  Vaping Use   Vaping Use: Never used  Substance Use Topics   Alcohol use: Yes    Comment: rarely    Drug use: No     Allergies   Sulfa antibiotics   Review of Systems Review of Systems  HENT:  Positive for ear pain.   Per HPI   Physical Exam Triage Vital Signs ED Triage Vitals  Enc Vitals Group     BP 01/29/22 1626 (!) 171/116     Pulse Rate 01/29/22 1626 86     Resp 01/29/22 1626 18     Temp 01/29/22 1626 98.2 F (36.8 C)     Temp Source 01/29/22 1626 Oral     SpO2 01/29/22 1626 98 %     Weight --      Height --      Head Circumference --      Peak Flow --      Pain Score 01/29/22 1623 5     Pain Loc --      Pain Edu? --      Excl. in [place]? --    No data found.  Updated Vital Signs BP (!) 152/104 (BP Location: Right Arm)   Pulse 86   Temp 98.2 F (36.8 C) (Oral)   Resp 18   LMP 01/14/2022 (Approximate)   SpO2 98%   Visual Acuity Right Eye Distance:   Left Eye  Distance:   Bilateral Distance:    Right Eye Near:   Left Eye Near:    Bilateral Near:     Physical Exam Vitals and nursing note reviewed.  Constitutional:      Appearance: She is not ill-appearing or toxic-appearing.  HENT:     Head: Normocephalic and atraumatic.     Right Ear: Hearing, tympanic membrane, ear canal and external ear normal. No drainage or tenderness. Tympanic membrane is not erythematous.     Left Ear: Hearing, tympanic membrane, ear canal and external ear normal. No drainage or tenderness. Tympanic membrane is not erythematous.     Nose: Congestion present.     Mouth/Throat:     Lips: Pink.     Mouth: Mucous membranes are moist.     Pharynx: No pharyngeal swelling or posterior oropharyngeal erythema.     Tonsils: No tonsillar exudate or tonsillar abscesses. 0 on the right. 0 on the left.  Eyes:     General: Lids are normal. Vision grossly intact. Gaze aligned appropriately.     Extraocular Movements: Extraocular movements intact.     Conjunctiva/sclera: Conjunctivae normal.     Pupils: Pupils are equal, round, and reactive to light.     Comments: EOMs intact without pain or dizziness elicited.  Cardiovascular:     Rate and Rhythm: Normal rate and regular rhythm.     Heart sounds: Normal heart sounds, S1 normal and S2 normal.  Pulmonary:     Effort: Pulmonary effort is normal. No respiratory distress.     Breath sounds: Normal breath sounds and air entry.  Musculoskeletal:     Cervical back: Neck supple.  Skin:    General: Skin is warm and dry.     Capillary Refill: Capillary refill takes less than 2 seconds.     Findings: No rash.  Neurological:     General: No focal deficit present.     Mental Status: She is alert and oriented to person, place,  and time. Mental status is at baseline.     Cranial Nerves: Cranial nerves 2-12 are intact. No dysarthria or facial asymmetry.     Sensory: Sensation is intact.     Motor: Motor function is intact.      Coordination: Coordination is intact.     Gait: Gait is intact.     Comments: 5/5 strength to bilateral upper and lower extremities, non focal neuro exam.   Psychiatric:        Mood and Affect: Mood normal.        Speech: Speech normal.        Behavior: Behavior normal.        Thought Content: Thought content normal.        Judgment: Judgment normal.      UC Treatments / Results  Labs (all labs ordered are listed, but only abnormal results are displayed) Labs Reviewed  POCT URINALYSIS DIPSTICK, ED / UC - Abnormal; Notable for the following components:      Result Value   Hgb urine dipstick SMALL (*)    Protein, ur >=300 (*)    All other components within normal limits  SARS CORONAVIRUS 2 (TAT 6-24 HRS)    EKG   Radiology No results found.  Procedures Procedures (including critical care time)  Medications Ordered in UC Medications - No data to display  Initial Impression / Assessment and Plan / UC Course  I have reviewed the triage vital signs and the nursing notes.  Pertinent labs & imaging results that were available during my care of the patient were reviewed by me and considered in my medical decision making (see chart for details).   1. Viral URI with cough Symptoms and physical exam consistent with a viral upper respiratory tract infection that will likely resolve with rest, fluids, and prescriptions for symptomatic relief. No indication for imaging today based on stable cardiopulmonary exam and hemodynamically stable vital signs. COVID-19 testing is pending.  We will call patient if this is positive.  Quarantine guidelines discussed. Currently on day 3 of symptoms and does qualify for antiviral therapy.   Tessalon pearles sent to pharmacy for symptomatic relief to be taken as prescribed. May use tylenol over the counter 1,'000mg'$  every 6 hours as needed for fever, chills, and aches/pains. Guaifenesin over the counter may be used as needed for nasal congestion and  cough.   2. Essential hypertension Patient advised to continue taking lisinopril '20mg'$  daily as directed by nephrologist. She is to stop taking double doses of this medication as this could result in hypotension. She recently had visit with nephrologist for medication management. Elevated BP likely due to viral illness and recent stress. Neurovascularly intact to baseline without focal neurologic deficit. She is to continue taking blood pressure readings at home 3-4 times per week and write these numbers down. BP re-checked with manual cuff by nursing staff with improvement to 152/104. Heart rate stable. Strict ER return precautions dicussed, she expresses understanding.   Strict ED/urgent care return precautions given.  Patient verbalizes understanding and agreement with plan.  Counseled patient regarding possible side effects and uses of all medications prescribed at today's visit.  Patient verbalizes understanding and agreement with plan.  All questions answered.  Patient discharged from urgent care in stable condition.        Final Clinical Impressions(s) / UC Diagnoses   Final diagnoses:  Viral URI with cough  Elevated blood pressure reading  Essential hypertension     Discharge Instructions  You have a viral upper respiratory infection.  COVID-19 testing is pending. We will call you with results if positive. If your COVID test is positive, you must stay at home until day 6 of symptoms. On day 6, you may go out into public and go back to work, but you must wear a mask until day 11 of symptoms to prevent spread to others.  Purchase mucinex (guaifenesin) '1200mg'$  and take this every 12 hours for the next few days to thin your nasal congestion and mucous so that you are able to get out of your body easier by coughing and blowing your nose. Drink plenty of water while taking this.  Take tessalon pearles every 8 hours as needed for cough.  You may take Tylenol 1000 mg every 6 hours as  needed for fever, chills, and aches and pains.  Continue taking lisinopril 20 mg as prescribed.  Do not double doses of this. Your blood pressure is likely elevated due to viral illness and stress. You may continue taking your blood pressure once daily at home and write these numbers down to keep a log of blood pressure.  If your blood pressure remains persistently elevated, please follow-up with your nephrologist for another appointment to discuss this further.  If you develop any new or worsening symptoms, please return.  If your symptoms are severe, please go to the emergency room.  Follow-up with your primary care provider for further evaluation and management of your symptoms as well as ongoing wellness visits.  I hope you feel better!       ED Prescriptions     Medication Sig Dispense Auth. Provider   benzonatate (TESSALON) 100 MG capsule Take 1 capsule (100 mg total) by mouth every 8 (eight) hours. 21 capsule Talbot Grumbling, FNP      PDMP not reviewed this encounter.   Talbot Grumbling, [place] 02/01/22 [id number]

## 2022-01-30 LAB — POCT URINALYSIS DIPSTICK, ED / UC
Bilirubin Urine: NEGATIVE
Glucose, UA: NEGATIVE mg/dL
Ketones, ur: NEGATIVE mg/dL
Leukocytes,Ua: NEGATIVE
Nitrite: NEGATIVE
Protein, ur: >=300 mg/dL — AB
Specific Gravity, Urine: >=1.03 (ref 1.005–1.030)
Urobilinogen, UA: 0.2 mg/dL (ref 0.0–1.0)
pH: 5.5 (ref 5.0–8.0)

## 2022-01-30 LAB — SARS CORONAVIRUS 2 (TAT 6-24 HRS): SARS Coronavirus 2: NEGATIVE

## 2022-03-26 DIAGNOSIS — J019 Acute sinusitis, unspecified: Secondary | ICD-10-CM | POA: Diagnosis not present

## 2022-03-26 DIAGNOSIS — R059 Cough, unspecified: Secondary | ICD-10-CM | POA: Diagnosis not present

## 2022-03-26 DIAGNOSIS — R0602 Shortness of breath: Secondary | ICD-10-CM | POA: Diagnosis not present

## 2022-03-26 DIAGNOSIS — J208 Acute bronchitis due to other specified organisms: Secondary | ICD-10-CM | POA: Diagnosis not present

## 2022-04-14 DIAGNOSIS — N393 Stress incontinence (female) (male): Secondary | ICD-10-CM | POA: Diagnosis not present

## 2022-04-14 DIAGNOSIS — N281 Cyst of kidney, acquired: Secondary | ICD-10-CM | POA: Diagnosis not present

## 2022-04-14 DIAGNOSIS — R351 Nocturia: Secondary | ICD-10-CM | POA: Diagnosis not present

## 2022-04-14 DIAGNOSIS — N200 Calculus of kidney: Secondary | ICD-10-CM | POA: Diagnosis not present

## 2022-05-08 DIAGNOSIS — B977 Papillomavirus as the cause of diseases classified elsewhere: Secondary | ICD-10-CM | POA: Diagnosis not present

## 2022-05-08 DIAGNOSIS — N72 Inflammatory disease of cervix uteri: Secondary | ICD-10-CM | POA: Diagnosis not present

## 2022-05-08 DIAGNOSIS — N870 Mild cervical dysplasia: Secondary | ICD-10-CM | POA: Diagnosis not present

## 2022-05-20 DIAGNOSIS — R109 Unspecified abdominal pain: Secondary | ICD-10-CM | POA: Diagnosis not present

## 2022-05-20 DIAGNOSIS — N200 Calculus of kidney: Secondary | ICD-10-CM | POA: Diagnosis not present

## 2022-05-22 DIAGNOSIS — K599 Functional intestinal disorder, unspecified: Secondary | ICD-10-CM | POA: Diagnosis not present

## 2022-05-22 DIAGNOSIS — N319 Neuromuscular dysfunction of bladder, unspecified: Secondary | ICD-10-CM | POA: Diagnosis not present

## 2022-05-22 DIAGNOSIS — R109 Unspecified abdominal pain: Secondary | ICD-10-CM | POA: Diagnosis not present

## 2022-05-22 DIAGNOSIS — G8929 Other chronic pain: Secondary | ICD-10-CM | POA: Diagnosis not present

## 2022-05-22 DIAGNOSIS — N393 Stress incontinence (female) (male): Secondary | ICD-10-CM | POA: Diagnosis not present

## 2022-05-22 DIAGNOSIS — R278 Other lack of coordination: Secondary | ICD-10-CM | POA: Diagnosis not present

## 2022-06-03 DIAGNOSIS — R109 Unspecified abdominal pain: Secondary | ICD-10-CM | POA: Diagnosis not present

## 2022-06-03 DIAGNOSIS — N200 Calculus of kidney: Secondary | ICD-10-CM | POA: Diagnosis not present

## 2022-07-03 DIAGNOSIS — N200 Calculus of kidney: Secondary | ICD-10-CM | POA: Diagnosis not present

## 2022-07-03 DIAGNOSIS — R3129 Other microscopic hematuria: Secondary | ICD-10-CM | POA: Diagnosis not present

## 2022-07-13 DIAGNOSIS — M67911 Unspecified disorder of synovium and tendon, right shoulder: Secondary | ICD-10-CM | POA: Diagnosis not present

## 2022-07-13 DIAGNOSIS — M25511 Pain in right shoulder: Secondary | ICD-10-CM | POA: Diagnosis not present

## 2022-07-13 DIAGNOSIS — M5412 Radiculopathy, cervical region: Secondary | ICD-10-CM | POA: Diagnosis not present

## 2022-07-21 DIAGNOSIS — M3214 Glomerular disease in systemic lupus erythematosus: Secondary | ICD-10-CM | POA: Diagnosis not present

## 2022-07-22 DIAGNOSIS — D259 Leiomyoma of uterus, unspecified: Secondary | ICD-10-CM | POA: Diagnosis not present

## 2022-07-22 DIAGNOSIS — R102 Pelvic and perineal pain: Secondary | ICD-10-CM | POA: Diagnosis not present

## 2022-07-29 DIAGNOSIS — M329 Systemic lupus erythematosus, unspecified: Secondary | ICD-10-CM | POA: Diagnosis not present

## 2022-07-29 DIAGNOSIS — M3214 Glomerular disease in systemic lupus erythematosus: Secondary | ICD-10-CM | POA: Diagnosis not present

## 2022-07-29 DIAGNOSIS — Z79899 Other long term (current) drug therapy: Secondary | ICD-10-CM | POA: Diagnosis not present

## 2022-08-04 DIAGNOSIS — Z789 Other specified health status: Secondary | ICD-10-CM | POA: Diagnosis not present

## 2022-08-04 DIAGNOSIS — M25511 Pain in right shoulder: Secondary | ICD-10-CM | POA: Diagnosis not present

## 2022-08-04 DIAGNOSIS — M5412 Radiculopathy, cervical region: Secondary | ICD-10-CM | POA: Diagnosis not present

## 2022-08-04 DIAGNOSIS — R29898 Other symptoms and signs involving the musculoskeletal system: Secondary | ICD-10-CM | POA: Diagnosis not present

## 2022-08-04 DIAGNOSIS — Z7409 Other reduced mobility: Secondary | ICD-10-CM | POA: Diagnosis not present

## 2022-08-21 DIAGNOSIS — G8929 Other chronic pain: Secondary | ICD-10-CM | POA: Diagnosis not present

## 2022-08-21 DIAGNOSIS — N393 Stress incontinence (female) (male): Secondary | ICD-10-CM | POA: Diagnosis not present

## 2022-08-21 DIAGNOSIS — N319 Neuromuscular dysfunction of bladder, unspecified: Secondary | ICD-10-CM | POA: Diagnosis not present

## 2022-08-21 DIAGNOSIS — K599 Functional intestinal disorder, unspecified: Secondary | ICD-10-CM | POA: Diagnosis not present

## 2022-08-21 DIAGNOSIS — R278 Other lack of coordination: Secondary | ICD-10-CM | POA: Diagnosis not present

## 2022-08-25 DIAGNOSIS — M25611 Stiffness of right shoulder, not elsewhere classified: Secondary | ICD-10-CM | POA: Diagnosis not present

## 2022-08-25 DIAGNOSIS — M25511 Pain in right shoulder: Secondary | ICD-10-CM | POA: Diagnosis not present

## 2022-08-25 DIAGNOSIS — R29898 Other symptoms and signs involving the musculoskeletal system: Secondary | ICD-10-CM | POA: Diagnosis not present

## 2022-09-07 DIAGNOSIS — M25511 Pain in right shoulder: Secondary | ICD-10-CM | POA: Diagnosis not present

## 2022-09-07 DIAGNOSIS — M5412 Radiculopathy, cervical region: Secondary | ICD-10-CM | POA: Diagnosis not present

## 2022-09-08 DIAGNOSIS — M25611 Stiffness of right shoulder, not elsewhere classified: Secondary | ICD-10-CM | POA: Diagnosis not present

## 2022-09-08 DIAGNOSIS — R29898 Other symptoms and signs involving the musculoskeletal system: Secondary | ICD-10-CM | POA: Diagnosis not present

## 2022-09-15 DIAGNOSIS — G8929 Other chronic pain: Secondary | ICD-10-CM | POA: Diagnosis not present

## 2022-09-15 DIAGNOSIS — R29898 Other symptoms and signs involving the musculoskeletal system: Secondary | ICD-10-CM | POA: Diagnosis not present

## 2022-09-15 DIAGNOSIS — M25611 Stiffness of right shoulder, not elsewhere classified: Secondary | ICD-10-CM | POA: Diagnosis not present

## 2022-09-15 DIAGNOSIS — Z79899 Other long term (current) drug therapy: Secondary | ICD-10-CM | POA: Diagnosis not present

## 2022-09-15 DIAGNOSIS — M25511 Pain in right shoulder: Secondary | ICD-10-CM | POA: Diagnosis not present

## 2022-09-22 DIAGNOSIS — G8929 Other chronic pain: Secondary | ICD-10-CM | POA: Diagnosis not present

## 2022-09-22 DIAGNOSIS — M25511 Pain in right shoulder: Secondary | ICD-10-CM | POA: Diagnosis not present

## 2022-09-22 DIAGNOSIS — M25611 Stiffness of right shoulder, not elsewhere classified: Secondary | ICD-10-CM | POA: Diagnosis not present

## 2022-09-22 DIAGNOSIS — Z79899 Other long term (current) drug therapy: Secondary | ICD-10-CM | POA: Diagnosis not present

## 2022-09-22 DIAGNOSIS — R29898 Other symptoms and signs involving the musculoskeletal system: Secondary | ICD-10-CM | POA: Diagnosis not present

## 2022-09-29 DIAGNOSIS — M25511 Pain in right shoulder: Secondary | ICD-10-CM | POA: Diagnosis not present

## 2022-09-29 DIAGNOSIS — M25611 Stiffness of right shoulder, not elsewhere classified: Secondary | ICD-10-CM | POA: Diagnosis not present

## 2022-09-29 DIAGNOSIS — R29898 Other symptoms and signs involving the musculoskeletal system: Secondary | ICD-10-CM | POA: Diagnosis not present

## 2022-09-29 DIAGNOSIS — G8929 Other chronic pain: Secondary | ICD-10-CM | POA: Diagnosis not present

## 2022-09-29 DIAGNOSIS — Z79899 Other long term (current) drug therapy: Secondary | ICD-10-CM | POA: Diagnosis not present

## 2022-10-01 DIAGNOSIS — R109 Unspecified abdominal pain: Secondary | ICD-10-CM | POA: Diagnosis not present

## 2022-10-01 DIAGNOSIS — R5383 Other fatigue: Secondary | ICD-10-CM | POA: Diagnosis not present

## 2022-10-13 DIAGNOSIS — N281 Cyst of kidney, acquired: Secondary | ICD-10-CM | POA: Diagnosis not present

## 2022-10-13 DIAGNOSIS — N200 Calculus of kidney: Secondary | ICD-10-CM | POA: Diagnosis not present

## 2022-10-13 DIAGNOSIS — N393 Stress incontinence (female) (male): Secondary | ICD-10-CM | POA: Diagnosis not present

## 2022-10-13 DIAGNOSIS — R351 Nocturia: Secondary | ICD-10-CM | POA: Diagnosis not present

## 2022-10-21 DIAGNOSIS — M25511 Pain in right shoulder: Secondary | ICD-10-CM | POA: Diagnosis not present

## 2022-10-21 DIAGNOSIS — M25611 Stiffness of right shoulder, not elsewhere classified: Secondary | ICD-10-CM | POA: Diagnosis not present

## 2022-10-21 DIAGNOSIS — G8929 Other chronic pain: Secondary | ICD-10-CM | POA: Diagnosis not present

## 2022-10-21 DIAGNOSIS — R29898 Other symptoms and signs involving the musculoskeletal system: Secondary | ICD-10-CM | POA: Diagnosis not present

## 2022-10-28 DIAGNOSIS — M25611 Stiffness of right shoulder, not elsewhere classified: Secondary | ICD-10-CM | POA: Diagnosis not present

## 2022-10-28 DIAGNOSIS — R29898 Other symptoms and signs involving the musculoskeletal system: Secondary | ICD-10-CM | POA: Diagnosis not present

## 2022-10-28 DIAGNOSIS — R6889 Other general symptoms and signs: Secondary | ICD-10-CM | POA: Diagnosis not present

## 2022-10-28 DIAGNOSIS — G8929 Other chronic pain: Secondary | ICD-10-CM | POA: Diagnosis not present

## 2022-10-28 DIAGNOSIS — M25511 Pain in right shoulder: Secondary | ICD-10-CM | POA: Diagnosis not present

## 2022-11-09 IMAGING — DX DG CHEST 2V
2 series · 2 of 2 positions shown · non-contrast
Comparison: Chest x-ray 08/15/2019. CT chest 05/19/2018.

CLINICAL DATA: Chest pain.

EXAM:
CHEST - 2 VIEW

[chest pa]
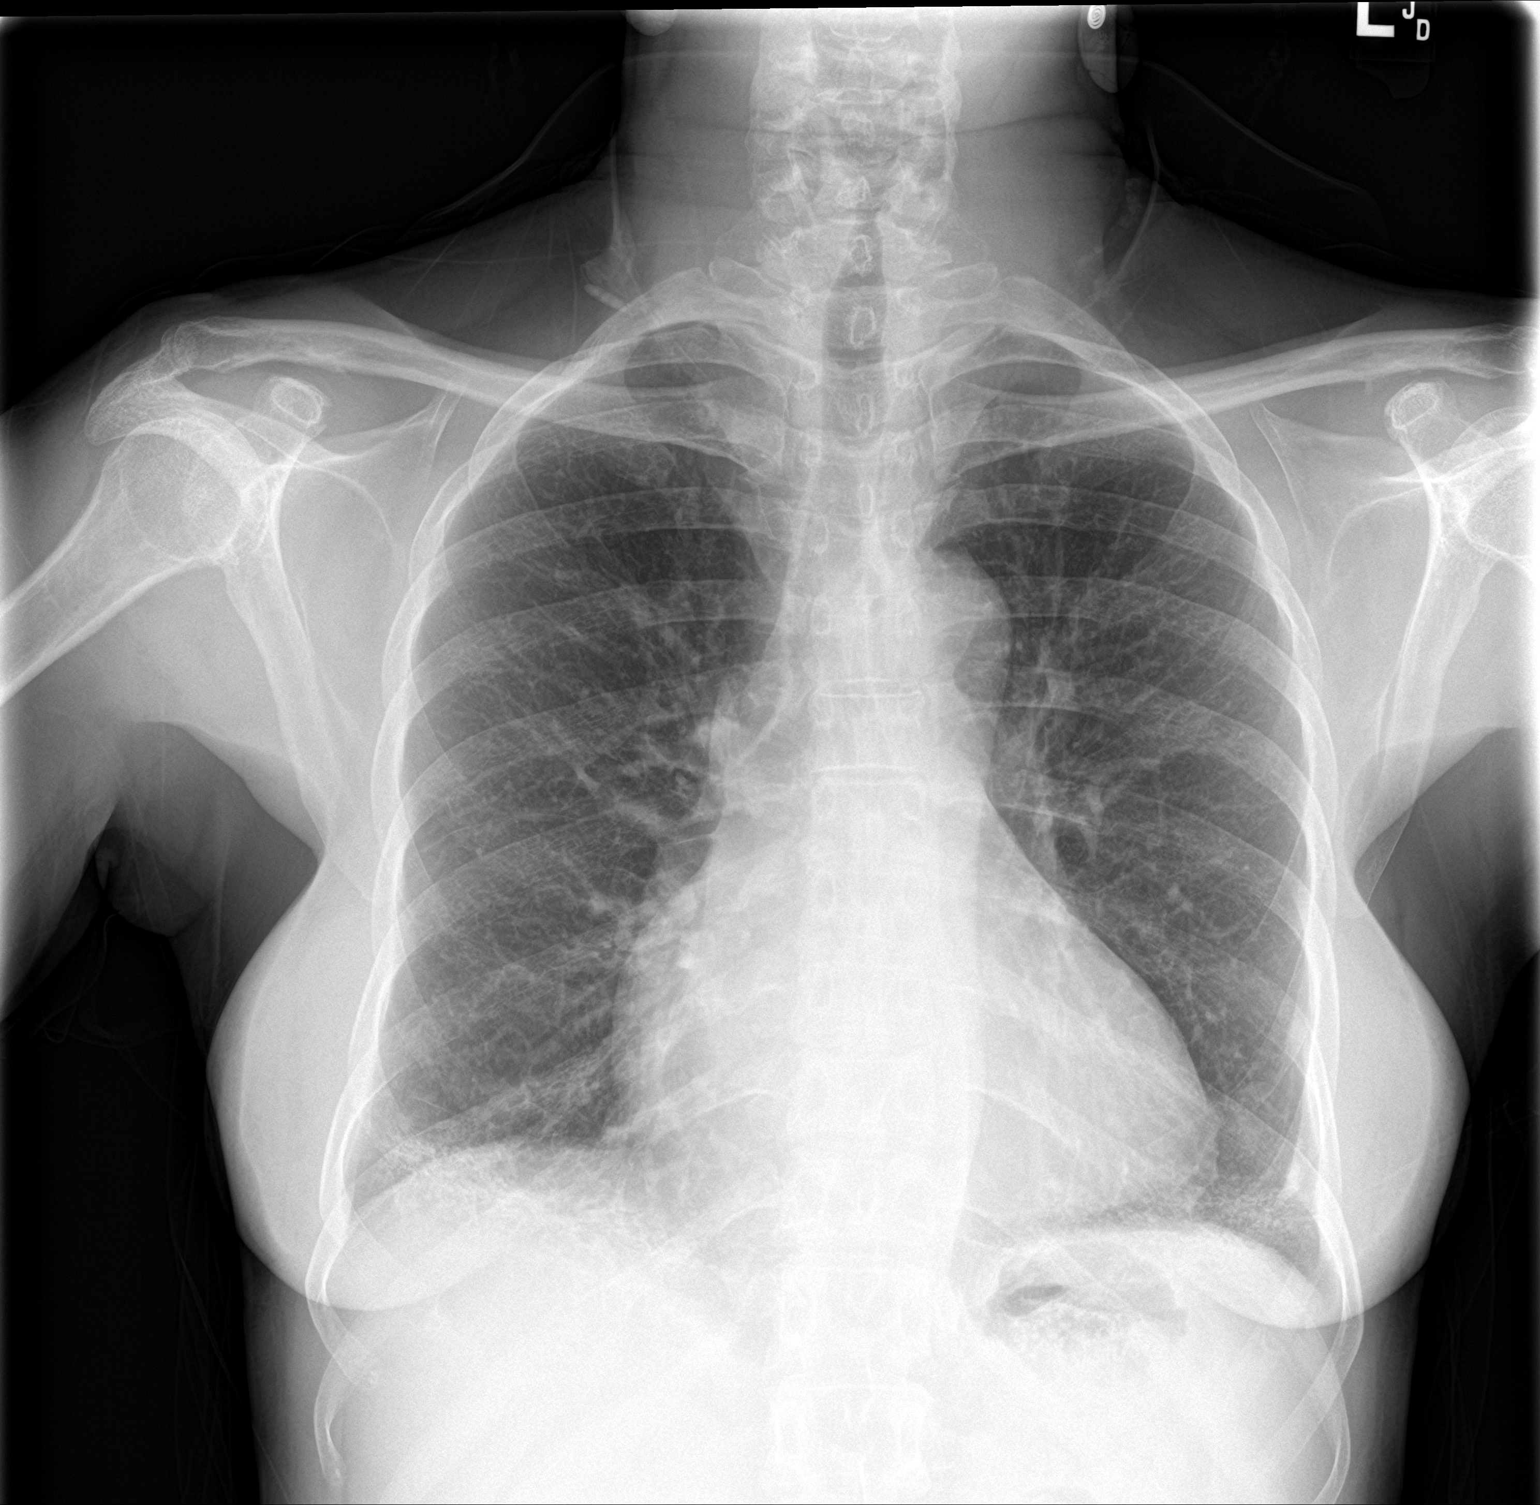

[chest lat]
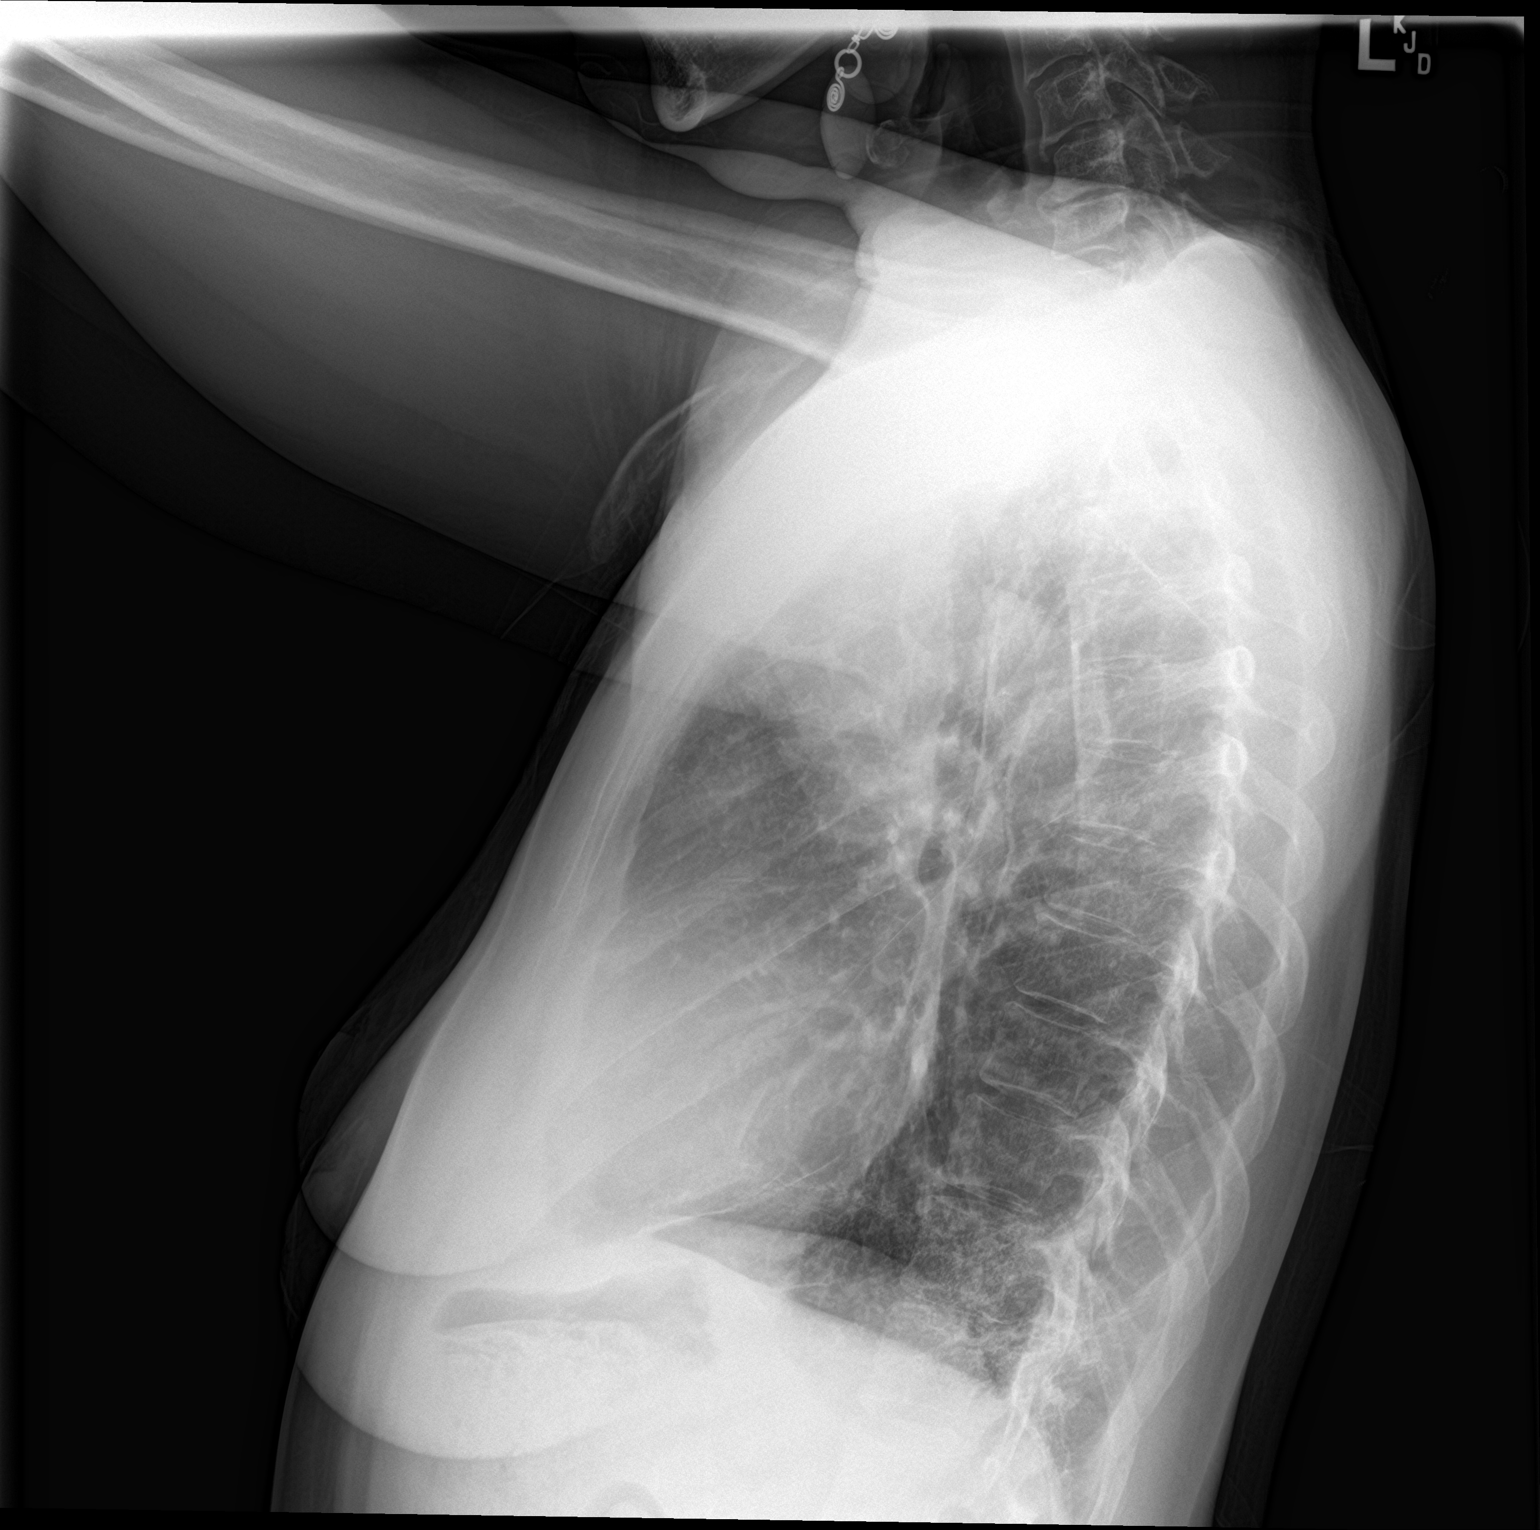

[2 of 2 positions shown; findings below may reference images not displayed]

FINDINGS: Heart is mildly enlarged. There are some chronic appearing
interstitial opacities in the lung bases, unchanged. The lungs are
otherwise clear. No pleural effusion or pneumothorax identified. No
acute fractures are seen.
IMPRESSION: 1. No acute cardiopulmonary process.
2. Stable chronic interstitial opacities in the lung bases.
3. Stable cardiomegaly.

## 2022-11-13 DIAGNOSIS — G8929 Other chronic pain: Secondary | ICD-10-CM | POA: Diagnosis not present

## 2022-11-13 DIAGNOSIS — M25611 Stiffness of right shoulder, not elsewhere classified: Secondary | ICD-10-CM | POA: Diagnosis not present

## 2022-11-13 DIAGNOSIS — R6889 Other general symptoms and signs: Secondary | ICD-10-CM | POA: Diagnosis not present

## 2022-11-13 DIAGNOSIS — R29898 Other symptoms and signs involving the musculoskeletal system: Secondary | ICD-10-CM | POA: Diagnosis not present

## 2022-11-13 DIAGNOSIS — M25511 Pain in right shoulder: Secondary | ICD-10-CM | POA: Diagnosis not present

## 2022-12-18 DIAGNOSIS — I10 Essential (primary) hypertension: Secondary | ICD-10-CM | POA: Diagnosis not present

## 2022-12-18 DIAGNOSIS — J069 Acute upper respiratory infection, unspecified: Secondary | ICD-10-CM | POA: Diagnosis not present

## 2023-01-19 DIAGNOSIS — Z79899 Other long term (current) drug therapy: Secondary | ICD-10-CM | POA: Diagnosis not present

## 2023-01-19 DIAGNOSIS — R809 Proteinuria, unspecified: Secondary | ICD-10-CM | POA: Diagnosis not present

## 2023-01-19 DIAGNOSIS — D649 Anemia, unspecified: Secondary | ICD-10-CM | POA: Diagnosis not present

## 2023-01-19 DIAGNOSIS — N1831 Chronic kidney disease, stage 3a: Secondary | ICD-10-CM | POA: Diagnosis not present

## 2023-01-19 DIAGNOSIS — M3214 Glomerular disease in systemic lupus erythematosus: Secondary | ICD-10-CM | POA: Diagnosis not present

## 2023-01-19 DIAGNOSIS — I129 Hypertensive chronic kidney disease with stage 1 through stage 4 chronic kidney disease, or unspecified chronic kidney disease: Secondary | ICD-10-CM | POA: Diagnosis not present

## 2023-02-10 DIAGNOSIS — Z79899 Other long term (current) drug therapy: Secondary | ICD-10-CM | POA: Diagnosis not present

## 2023-02-10 DIAGNOSIS — M329 Systemic lupus erythematosus, unspecified: Secondary | ICD-10-CM | POA: Diagnosis not present

## 2023-02-10 DIAGNOSIS — M3214 Glomerular disease in systemic lupus erythematosus: Secondary | ICD-10-CM | POA: Diagnosis not present

## 2023-03-30 DIAGNOSIS — L219 Seborrheic dermatitis, unspecified: Secondary | ICD-10-CM | POA: Diagnosis not present

## 2023-04-21 DIAGNOSIS — D649 Anemia, unspecified: Secondary | ICD-10-CM | POA: Diagnosis not present

## 2023-04-21 DIAGNOSIS — M329 Systemic lupus erythematosus, unspecified: Secondary | ICD-10-CM | POA: Diagnosis not present

## 2023-04-21 DIAGNOSIS — Z79899 Other long term (current) drug therapy: Secondary | ICD-10-CM | POA: Diagnosis not present

## 2023-09-13 DIAGNOSIS — M3214 Glomerular disease in systemic lupus erythematosus: Secondary | ICD-10-CM | POA: Diagnosis not present

## 2023-09-27 DIAGNOSIS — R102 Pelvic and perineal pain: Secondary | ICD-10-CM | POA: Diagnosis not present

## 2023-11-01 ENCOUNTER — Ambulatory Visit (INDEPENDENT_AMBULATORY_CARE_PROVIDER_SITE_OTHER)

## 2023-11-01 ENCOUNTER — Ambulatory Visit (INDEPENDENT_AMBULATORY_CARE_PROVIDER_SITE_OTHER): Admitting: Podiatry

## 2023-11-01 DIAGNOSIS — M7741 Metatarsalgia, right foot: Secondary | ICD-10-CM | POA: Diagnosis not present

## 2023-11-01 DIAGNOSIS — M7751 Other enthesopathy of right foot: Secondary | ICD-10-CM

## 2023-11-01 DIAGNOSIS — M21619 Bunion of unspecified foot: Secondary | ICD-10-CM

## 2023-11-01 NOTE — Patient Instructions (Addendum)
WEARING INSTRUCTIONS FOR ORTHOTICS  Don't expect to be comfortable wearing your orthotic devices for the first time.  Like eyeglasses, you may be aware of them as time passes, they will not be uncomfortable and you will enjoy wearing them.  FOLLOW THESE INSTRUCTIONS EXACTLY!  Wear your orthotic devices for:       Not more than 1 hour the first day.       Not more than 2 hours the second day.       Not more than 3 hours the third day and so on.        Or wear them for as long as they feel comfortable.       If you experience discomfort in your feet or legs take them out.  When feet & legs feel       better, put them back in.  You do need to be consistent and wear them a little        everyday. 2.   If at any time the orthotic devices become acutely uncomfortable before the       time for that particular day, STOP WEARING THEM. 3.   On the next day, do not increase the wearing time. 4.   Subsequently, increase the wearing time by 15-30 minutes only if comfortable to do       so. 5.   You will be seen by your doctor about 2-4 weeks after you receive your orthotic       devices, at which time you will probably be wearing your devices comfortably        for about 8 hours or more a day. 6.   Some patients occasionally report mild aches or discomfort in other parts of the of       body such as the knees, hips or back after 3 or 4 consecutive hours of wear.  If this       is the case with you, do not extend your wearing time.  Instead, cut it back an hour or       two.  In all likelihood, these symptoms will disappear in a short period of time as your       body posture realigns itself and functions more efficiently. 7.   It is possible that your orthotic device may require some small changes or adjustment       to improve their function or make them more comfortable.   This is usually not done       before one to three months have elapsed.  These adjustments are made in        accordance with  the changed position your feet are assuming as a result of       improved biomechanical function. 8.   In women's shoes, it's not unusual for your heel to slip out of the shoe, particularly if       they are step-in-shoes.  If this is the case, try other shoes or other styles.  Try to       purchase shoes which have deeper heal seats or higher heel counters. 9.   Squeaking of orthotics devices in the shoes is due to the movement of the devices       when they are functioning normally.  To eliminate squeaking, simply dust some       baby powder into your shoes before inserting the devices.  If this does not work,          apply soap or wax to the edges of the orthotic devices or put a tissue into the shoes. 10. It is important that you follow these directions explicitly.  Failure to do so will simply       prolong the adjustment period or create problems which are easily avoided.  It makes       no difference if you are wearing your orthotic devices for only a few hours after        several months, so long as you are wearing them comfortably for those hours. 11. If you have any questions or complaints, contact our office.  We have no way of       knowing about your problems unless you tell us.  If we do not hear from you, we will       assume that you are proceeding well.  Plantar Fasciitis (Heel Spur Syndrome) with Rehab The plantar fascia is a fibrous, ligament-like, soft-tissue structure that spans the bottom of the foot. Plantar fasciitis is a condition that causes pain in the foot due to inflammation of the tissue. SYMPTOMS  Pain and tenderness on the underneath side of the foot. Pain that worsens with standing or walking. CAUSES  Plantar fasciitis is caused by irritation and injury to the plantar fascia on the underneath side of the foot. Common mechanisms of injury include: Direct trauma to bottom of the foot. Damage to a small nerve that runs under the foot where the main fascia  attaches to the heel bone. Stress placed on the plantar fascia due to bone spurs. RISK INCREASES WITH:  Activities that place stress on the plantar fascia (running, jumping, pivoting, or cutting). Poor strength and flexibility. Improperly fitted shoes. Tight calf muscles. Flat feet. Failure to warm-up properly before activity. Obesity. PREVENTION Warm up and stretch properly before activity. Allow for adequate recovery between workouts. Maintain physical fitness: Strength, flexibility, and endurance. Cardiovascular fitness. Maintain a health body weight. Avoid stress on the plantar fascia. Wear properly fitted shoes, including arch supports for individuals who have flat feet.  PROGNOSIS  If treated properly, then the symptoms of plantar fasciitis usually resolve without surgery. However, occasionally surgery is necessary.  RELATED COMPLICATIONS  Recurrent symptoms that may result in a chronic condition. Problems of the lower back that are caused by compensating for the injury, such as limping. Pain or weakness of the foot during push-off following surgery. Chronic inflammation, scarring, and partial or complete fascia tear, occurring more often from repeated injections.  TREATMENT  Treatment initially involves the use of ice and medication to help reduce pain and inflammation. The use of strengthening and stretching exercises may help reduce pain with activity, especially stretches of the Achilles tendon. These exercises may be performed at home or with a therapist. Your caregiver may recommend that you use heel cups of arch supports to help reduce stress on the plantar fascia. Occasionally, corticosteroid injections are given to reduce inflammation. If symptoms persist for greater than 6 months despite non-surgical (conservative), then surgery may be recommended.   MEDICATION  If pain medication is necessary, then nonsteroidal anti-inflammatory medications, such as aspirin and  ibuprofen, or other minor pain relievers, such as acetaminophen, are often recommended. Do not take pain medication within 7 days before surgery. Prescription pain relievers may be given if deemed necessary by your caregiver. Use only as directed and only as much as you need. Corticosteroid injections may be given by your caregiver. These injections should be reserved for the   most serious cases, because they may only be given a certain number of times.  HEAT AND COLD Cold treatment (icing) relieves pain and reduces inflammation. Cold treatment should be applied for 10 to 15 minutes every 2 to 3 hours for inflammation and pain and immediately after any activity that aggravates your symptoms. Use ice packs or massage the area with a piece of ice (ice massage). Heat treatment may be used prior to performing the stretching and strengthening activities prescribed by your caregiver, physical therapist, or athletic trainer. Use a heat pack or soak the injury in warm water.  SEEK IMMEDIATE MEDICAL CARE IF: Treatment seems to offer no benefit, or the condition worsens. Any medications produce adverse side effects.  EXERCISES- RANGE OF MOTION (ROM) AND STRETCHING EXERCISES - Plantar Fasciitis (Heel Spur Syndrome) These exercises may help you when beginning to rehabilitate your injury. Your symptoms may resolve with or without further involvement from your physician, physical therapist or athletic trainer. While completing these exercises, remember:  Restoring tissue flexibility helps normal motion to return to the joints. This allows healthier, less painful movement and activity. An effective stretch should be held for at least 30 seconds. A stretch should never be painful. You should only feel a gentle lengthening or release in the stretched tissue.  RANGE OF MOTION - Toe Extension, Flexion Sit with your right / left leg crossed over your opposite knee. Grasp your toes and gently pull them back toward  the top of your foot. You should feel a stretch on the bottom of your toes and/or foot. Hold this stretch for 10 seconds. Now, gently pull your toes toward the bottom of your foot. You should feel a stretch on the top of your toes and or foot. Hold this stretch for 10 seconds. Repeat  times. Complete this stretch 3 times per day.   RANGE OF MOTION - Ankle Dorsiflexion, Active Assisted Remove shoes and sit on a chair that is preferably not on a carpeted surface. Place right / left foot under knee. Extend your opposite leg for support. Keeping your heel down, slide your right / left foot back toward the chair until you feel a stretch at your ankle or calf. If you do not feel a stretch, slide your bottom forward to the edge of the chair, while still keeping your heel down. Hold this stretch for 10 seconds. Repeat 3 times. Complete this stretch 2 times per day.   STRETCH  Gastroc, Standing Place hands on wall. Extend right / left leg, keeping the front knee somewhat bent. Slightly point your toes inward on your back foot. Keeping your right / left heel on the floor and your knee straight, shift your weight toward the wall, not allowing your back to arch. You should feel a gentle stretch in the right / left calf. Hold this position for 10 seconds. Repeat 3 times. Complete this stretch 2 times per day.  STRETCH  Soleus, Standing Place hands on wall. Extend right / left leg, keeping the other knee somewhat bent. Slightly point your toes inward on your back foot. Keep your right / left heel on the floor, bend your back knee, and slightly shift your weight over the back leg so that you feel a gentle stretch deep in your back calf. Hold this position for 10 seconds. Repeat 3 times. Complete this stretch 2 times per day.  STRETCH  Gastrocsoleus, Standing  Note: This exercise can place a lot of stress on your foot and ankle. Please   complete this exercise only if specifically instructed by your  caregiver.  Place the ball of your right / left foot on a step, keeping your other foot firmly on the same step. Hold on to the wall or a rail for balance. Slowly lift your other foot, allowing your body weight to press your heel down over the edge of the step. You should feel a stretch in your right / left calf. Hold this position for 10 seconds. Repeat this exercise with a slight bend in your right / left knee. Repeat 3 times. Complete this stretch 2 times per day.   STRENGTHENING EXERCISES - Plantar Fasciitis (Heel Spur Syndrome)  These exercises may help you when beginning to rehabilitate your injury. They may resolve your symptoms with or without further involvement from your physician, physical therapist or athletic trainer. While completing these exercises, remember:  Muscles can gain both the endurance and the strength needed for everyday activities through controlled exercises. Complete these exercises as instructed by your physician, physical therapist or athletic trainer. Progress the resistance and repetitions only as guided.  STRENGTH - Towel Curls Sit in a chair positioned on a non-carpeted surface. Place your foot on a towel, keeping your heel on the floor. Pull the towel toward your heel by only curling your toes. Keep your heel on the floor. Repeat 3 times. Complete this exercise 2 times per day.  STRENGTH - Ankle Inversion Secure one end of a rubber exercise band/tubing to a fixed object (table, pole). Loop the other end around your foot just before your toes. Place your fists between your knees. This will focus your strengthening at your ankle. Slowly, pull your big toe up and in, making sure the band/tubing is positioned to resist the entire motion. Hold this position for 10 seconds. Have your muscles resist the band/tubing as it slowly pulls your foot back to the starting position. Repeat 3 times. Complete this exercises 2 times per day.  Document Released: 01/19/2005  Document Revised: 04/13/2011 Document Reviewed: 05/03/2008 [employer] Patient Information 2014 [employer].   

## 2023-11-02 NOTE — Progress Notes (Signed)
  Subjective:  Patient ID: Christine Todd, female    DOB: 1975-11-14,  MRN: [id number]  No chief complaint on file.   Discussed the use of AI scribe software for clinical note transcription with the patient, who gave verbal consent to proceed.  History of Present Illness Christine Todd is a 48 year old female with lupus who presents with right foot pain and numbness.  She experiences constant throbbing pain and numbness in the right foot, particularly affecting the third through fifth toes, with symptoms on both the dorsal and plantar surfaces. Prolonged standing and walking exacerbate the symptoms. Despite trying various types of shoes, including New Balance and Crocs with padding, the pain persists.  Approximately two weeks ago, she injured her toe, leading to increased pain when walking, though there was no swelling or bruising. She denies any other recent injuries or treatments for her feet.  Two weeks ago, she experienced an episode where her entire right leg felt numb, with the sensation shooting up from the foot. This was a new occurrence.        Objective:  There were no vitals filed for this visit.  Physical Exam General: AAO x3, NAD  Dermatological: Skin is warm, dry and supple bilateral.  There are no open sores, no preulcerative lesions, no rash or signs of infection present.  Vascular: Dorsalis Pedis artery and Posterior Tibial artery pedal pulses are 2/4 bilateral with immedate capillary fill time.  There is no pain with calf compression, swelling, warmth, erythema.   Neruologic: Grossly intact via light touch bilateral.  Negative Tinel's sign.  Musculoskeletal: Significant bunion present on the right side worse than left.  She does get some tenderness palpation with bunion.  But also she gets tenderness more along submetatarsal area on the right foot.  The small palpable neuroma like normal third interspace with a palpable click.  There is no area of pinpoint tenderness.   Mild discomfort along the medial band of plantar fascia just proximal metatarsal heads.  There is no area of pinpoint tenderness otherwise.  5/5.  Gait: Unassisted, Nonantalgic.     No images are attached to the encounter.    Results RADIOLOGY Foot X-ray: First metatarsal bone deviated medially, sesamoid bones under second, third, and fifth metatarsals, no fractures noted.   Assessment:   1. Bunion   2. Metatarsalgia, right foot      Plan:  Patient was evaluated and treated and all questions answered.  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 Symptoms consistent with metatarsalgia and Morton's neuroma.  Discussed other etiologies especially given the right leg numbness that she experienced this could be back related or other nerve impingement.  X-ray shows hallux valgus deformity. Neuroma suspected between the third and fourth toes due to nerve inflammation and mechanical pressure from bunion. Differential includes nerve entrapment elsewhere, but primary focus is on foot-related issues. - Provide full-length shoe inserts with better arch support.  PowerStep inserts dispensed. - Order MRI to evaluate internal structures and confirm neuroma diagnosis. - Schedule MRI after October 13 at her request.  Discussed that we will contact her to schedule. - Instruct to contact if no follow-up after MRI within a few days.      Return for MRI results .   Donnice JONELLE Fees DPM

## 2023-11-15 ENCOUNTER — Telehealth: Payer: Self-pay

## 2023-11-15 NOTE — Telephone Encounter (Signed)
 PA for MRI was denied due to no documentation of 6 weeks of treatment such as home exercises, medications and PT.

## 2023-11-16 ENCOUNTER — Telehealth: Payer: Self-pay | Admitting: Podiatry

## 2023-11-16 NOTE — Telephone Encounter (Signed)
 Patient stated she will schedule 6 week follow up, once she receive results from MRI.

## 2023-11-16 NOTE — Telephone Encounter (Signed)
 Tried calling patient, no answer,but left voicemail message for her to call the office.

## 2023-11-29 DIAGNOSIS — R102 Pelvic and perineal pain unspecified side: Secondary | ICD-10-CM | POA: Diagnosis not present

## 2023-11-29 DIAGNOSIS — D219 Benign neoplasm of connective and other soft tissue, unspecified: Secondary | ICD-10-CM | POA: Diagnosis not present

## 2023-12-28 ENCOUNTER — Encounter: Payer: Self-pay | Admitting: Podiatry

## 2024-01-07 ENCOUNTER — Encounter: Payer: Self-pay | Admitting: Podiatry

## 2024-01-14 ENCOUNTER — Ambulatory Visit
Admission: RE | Admit: 2024-01-14 | Discharge: 2024-01-14 | Disposition: A | Source: Ambulatory Visit | Attending: Podiatry

## 2024-01-14 DIAGNOSIS — M21619 Bunion of unspecified foot: Secondary | ICD-10-CM

## 2024-01-17 ENCOUNTER — Ambulatory Visit: Payer: Self-pay | Admitting: Podiatry

## 2024-01-17 ENCOUNTER — Ambulatory Visit: Admitting: Podiatry

## 2024-01-17 DIAGNOSIS — M7741 Metatarsalgia, right foot: Secondary | ICD-10-CM

## 2024-01-17 DIAGNOSIS — M21619 Bunion of unspecified foot: Secondary | ICD-10-CM | POA: Diagnosis not present

## 2024-01-17 NOTE — Progress Notes (Signed)
°  Subjective:  Patient ID: Christine Todd, female    DOB: Jul 19, 1975,  MRN: [id number]  Chief Complaint  Patient presents with   Bunions    Pt stated that she is still having some discomfort with her foot she is here for the results of her MRI     History of Present Illness Christine Todd is a 48 year old female with lupus who presents with right foot pain and numbness.  She says overall she is doing little bit better but about the same.  She presents to discuss MRI results as well.  Previous history: She experiences constant throbbing pain and numbness in the right foot, particularly affecting the third through fifth toes, with symptoms on both the dorsal and plantar surfaces. Prolonged standing and walking exacerbate the symptoms. Despite trying various types of shoes, including New Balance and Crocs with padding, the pain persists.  Approximately two weeks ago, she injured her toe, leading to increased pain when walking, though there was no swelling or bruising. She denies any other recent injuries or treatments for her feet.  Two weeks ago, she experienced an episode where her entire right leg felt numb, with the sensation shooting up from the foot. This was a new occurrence.        Objective:  There were no vitals filed for this visit.  Physical Exam General: AAO x3, NAD  Dermatological: Skin is warm, dry and supple bilateral.  There are no open sores, no preulcerative lesions, no rash or signs of infection present.  Vascular: Dorsalis Pedis artery and Posterior Tibial artery pedal pulses are 2/4 bilateral with immedate capillary fill time.  There is no pain with calf compression, swelling, warmth, erythema.   Neruologic: Grossly intact via light touch bilateral.  Negative Tinel's sign.  Musculoskeletal: Significant bunion present on the right side worse than left.  She does get some tenderness palpation with bunion.  But also she gets tenderness more along submetatarsal area on  the right foot.  The small palpable neuroma like normal third interspace with a palpable click.  Mild discomfort along the medial band of plantar fascia just proximal metatarsal heads.  He is also experiencing discomfort the dorsal aspect of the foot as well.  No specific area pinpoint tenderness there is no area of pinpoint tenderness otherwise.  5/5.   No images are attached to the encounter.      Assessment:   1. Bunion   2. Metatarsalgia, right foot      Plan:  Patient was evaluated and treated and all questions answered.  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  She is continue to get discomfort in the right side.  Recommend immobilization in a cam boot which was dispensed today.  This is for soft tissue and osseous healing. -She is already taking low-dose prednisone .  Discussed Voltaren  gel. - As she starts to improve she can transition back into a shoe with good arch support when she has power steps.  Discussed changing shoes to the more stability type shoe.  She is currently wearing a shoe that does not lace.  Return if symptoms worsen or fail to improve, for right foot pain in 4-6 weeks.  Donnice JONELLE Fees DPM

## 2024-01-17 NOTE — Patient Instructions (Signed)
 You can use VOLTAREN  GEL on the foot as needed  Ice the foot daily  When You Have a Walking Boot: Self-Care for Adults  A walking boot holds your foot or ankle in place after an injury or procedure. It helps with healing and stops your foot or ankle from getting hurt more. The boot has a hard, rigid outer frame. Its inner lining is a layer of padded material. The boots also have straps that you can adjust to secure them over your foot and leg. You may be given a walking boot if you can stand or walk on your injured foot. Ask how much you can walk while wearing the boot. How to put on your walking boot There are a few types of walking boots. Follow the steps for how to put on your certain type of boot. You may need to: Ask someone to help you put on the boot. Sit to put on the boot. Doing this is more comfortable. It also helps to prevent falls. Open up the boot fully. Put your foot in the boot so your heel rests against the back. Make sure your toes are supported by the base of the boot. They shouldn't hang over the front edge. Adjust the straps. The boot should feel secure but not too tight. Keep the boot straight. Do not bend the hard frame of the boot to get a good fit. How to walk with your walking boot How much you can walk will depend on your injury. Do not try to walk without wearing the boot until your health care provider says it's OK. While wearing the boot, you may need to: Use crutches or a cane as told. Wear a shoe with a heel on your other foot. This can help raise it to the height of the walking boot. Be careful when walking on surfaces that are uneven or wet. How to reduce swelling while using your walking boot  Rest your injured foot or leg as much as you can. Use ice or an ice pack as told. Take off your walking boot. Place a towel between your skin and the ice or between your cast and the ice. Leave the ice on for 20 minutes, 2-3 times a day. If your skin turns red,  take off the ice right away to prevent skin damage. The risk of damage is higher if you can't feel pain, heat, or cold. Move your toes often to reduce stiffness and swelling. Raise your foot or leg above the level of your heart while you're sitting down. Try to do this at least 2?3 hours each day. Use pillows as needed. If the swelling gets worse, loosen the boot. Rest your foot and leg. How to care for your skin and foot while using your walking boot Wear a long sock to stop your foot and leg from rubbing inside the boot. Take off the boot once a day to check the injured area. Check for sores, rashes, swelling, or wounds. The skin should be a healthy color. It shouldn't be pale or blue. Try to watch your walking pattern (gait) in the boot. Make sure it's fairly normal and that you don't walk with a clear limp. Take care of any wound or cut from surgery as told. Clean and wash the injured area as told. Gently dry your foot and leg before putting the boot back on. How to take off your walking boot Take off the walking boot as told by your provider. In most cases,  it's OK to take off the boot: When you're resting or sleeping. To clean your foot and leg. How to keep your walking boot clean Do not put any part of the boot in a washing machine or dryer. Do not use chemical cleaning products. These may irritate your skin. Do not soak the liner of the boot. Use a washcloth with mild soap and water to clean the frame and liner of the boot by hand. Let the boot dry fully before you put it back on your foot. Follow these instructions at home: Activity Ask what things are safe for you to do. Bathe and shower as told by your provider. Do not do things that could make your injury worse. Ask when it's safe to drive. Contact a health care provider if: The boot is cracked or damaged. The boot doesn't fit right. Your foot or leg hurts. You have a rash, sore, or open sore on your foot or leg. The skin  on your foot or leg is pale. You have a wound or cut on your foot that's getting worse. Your skin is painful, red, or irritated. Your swelling doesn't get better, or it gets worse. Get help right away if: Your foot or leg turns numb. The skin on your foot or leg is: Cold. Blue. Christine Todd. This information is not intended to replace advice given to you by your health care provider. Make sure you discuss any questions you have with your health care provider. Document Revised: 07/24/2022 Document Reviewed: 07/24/2022 Elsevier Patient Education  2024 [employer].

## 2024-02-24 ENCOUNTER — Ambulatory Visit: Admitting: Podiatry

## 2024-02-24 ENCOUNTER — Ambulatory Visit

## 2024-02-24 DIAGNOSIS — M7741 Metatarsalgia, right foot: Secondary | ICD-10-CM

## 2024-02-24 DIAGNOSIS — M21619 Bunion of unspecified foot: Secondary | ICD-10-CM

## 2024-02-24 NOTE — Patient Instructions (Signed)
 Bunion: What to Know A bunion, or hallux valgus, is a bump that forms slowly on the inner side of your big toe joint. It happens when your big toe turns toward your second toe. Bunions may be small at first but get bigger over time. They can make walking painful. What are the causes? A bunion may be caused by: Wearing narrow or pointed shoes that force your big toe to press against the other toes. Problems with how your foot is shaped. Changes in your foot caused by some diseases or conditions. A foot injury. What increases the risk? You're more likely to get a bunion if: You wear shoes that squeeze your toes. You have certain diseases, such as: Rheumatoid arthritis. Cerebral palsy. Someone in your family gets bunions too. You have flat feet or low arches. You do things that put a lot of pressure on your feet, such as ballet. What are the signs or symptoms? The main symptom is a bump on the inner side of your big toe. You may also have: Pain. Redness and swelling around your big toe. Thick or hard skin on your big toe or between your toes. Stiffness or loss of movement in your big toe. Trouble walking. How is this diagnosed? A bunion may be diagnosed based on your symptoms, medical history, and activities.  You may also have tests, such as an X-ray. This helps your health care provider see the bones in your foot and look for damage to your joint. How is this treated? Treatment can help with symptoms and can stop the bunion from getting worse. What you need to do may depend on how bad your symptoms are. You may need to: Wear shoes that have a wide toe box. Use bunion pads to cushion your toes. Tape your toes together. Place an insert called an orthotic device in your shoe. This can help take pressure off your toe joint. Take medicine to help with pain and swelling. Put ice or heat on your foot. Do stretching exercises. Have surgery. You may need this if the bunion is causing very  bad symptoms. Follow these instructions at home: Managing pain, stiffness, and swelling     Use ice or an ice pack as told. Place a towel between your skin and the ice. Leave the ice on for 20 minutes, 2-3 times a day. Use heat as told. Use the heat source that your provider recommends, such as a moist heat pack or a heating pad. Do this as often as told. Place a towel between your skin and the heat source. Leave the heat on for 20-30 minutes. If your skin turns red, take off the ice or heat right away to prevent skin damage. The risk of damage is higher if you can't feel pain, heat, or cold. General instructions Exercise as told. Support your toe joint as told with: The right footwear. Shoe padding. Taping. Wear shoes that have a wide toe box. Avoid wearing tight shoes or shoes with high heels. Take your medicines only as told. Do not smoke, vape, or use nicotine or tobacco. Keep all follow-up visits. Your provider will check if the treatments are working. Contact a health care provider if: Your symptoms get worse. Your symptoms don't get better in 2 weeks. Get help right away if: You have very bad pain and trouble walking. This information is not intended to replace advice given to you by your health care provider. Make sure you discuss any questions you have with your health  care provider. Document Revised: 08/07/2022 Document Reviewed: 08/07/2022 Elsevier Patient Education  2024 [employer].

## 2024-02-24 NOTE — Progress Notes (Signed)
"  °  Subjective:  Patient ID: Christine Todd, female    DOB: 01-24-1976,  MRN: [id number]  Chief Complaint  Patient presents with   Foot Pain    Patient presents to office today for a follow up on R foot pain relates , she has been feeling better since wearing cam boot .    History of Present Illness Christine Todd is a 49 year old female with lupus who presents with right foot pain and numbness.  She does report that since she is going to camp and her symptoms have improved some.  She does not report any new injuries.  She is asking about orthopedic shoes.   Previous history: She experiences constant throbbing pain and numbness in the right foot, particularly affecting the third through fifth toes, with symptoms on both the dorsal and plantar surfaces. Prolonged standing and walking exacerbate the symptoms. Despite trying various types of shoes, including New Balance and Crocs with padding, the pain persists.  Approximately two weeks ago, she injured her toe, leading to increased pain when walking, though there was no swelling or bruising. She denies any other recent injuries or treatments for her feet.  Two weeks ago, she experienced an episode where her entire right leg felt numb, with the sensation shooting up from the foot. This was a new occurrence.        Objective:  There were no vitals filed for this visit.  Physical Exam General: AAO x3, NAD  Dermatological: Skin is warm, dry and supple bilateral.  There are no open sores, no preulcerative lesions, no rash or signs of infection present.  Vascular: Dorsalis Pedis artery and Posterior Tibial artery pedal pulses are 2/4 bilateral with immedate capillary fill time.  There is no pain with calf compression, swelling, warmth, erythema.   Neruologic: Grossly intact via light touch bilateral.  Negative Tinel's sign.  Musculoskeletal: Significant bunion present on the right side worse than left.  She does get some tenderness palpation  with bunion.  But also she gets tenderness more along submetatarsal area on the right foot.  I am not able to appreciate any area of pinpoint tenderness.  There is no significant edema and there is no erythema.     Assessment:   1. Bunion   2. Metatarsalgia, right foot      Plan:  Patient was evaluated and treated and all questions answered.  Assessment and Plan Assessment & Plan Right foot hallux valgus with metatarsalgia and Morton's neuroma -Overall symptoms have improved.  Discussed that she can transition into a shoe with good support as well as wider toebox.  This is to help coming the bunion as well.  She is asking about orthopedic shoes, but I do not think insurance will cover but we can send a referral. -We also had a discussion regards to her bunion.  Ultimately she would need a Lap[place] management.  We did discuss the surgery as well as postoperative course.  She will consider her options.  No follow-ups on file.  Donnice JONELLE Fees DPM "
